# Patient Record
Sex: Male | Born: 1945 | Race: White | Hispanic: No | Marital: Married | Smoking: Former smoker
Health system: Southern US, Community
[De-identification: ages and names within clinical notes are randomized; demographics above are authoritative.]

## PROBLEM LIST (undated history)

## (undated) DIAGNOSIS — D126 Benign neoplasm of colon, unspecified: Secondary | ICD-10-CM

## (undated) DIAGNOSIS — R0609 Other forms of dyspnea: Secondary | ICD-10-CM

## (undated) DIAGNOSIS — M109 Gout, unspecified: Secondary | ICD-10-CM

## (undated) DIAGNOSIS — Z974 Presence of external hearing-aid: Secondary | ICD-10-CM

## (undated) DIAGNOSIS — M47817 Spondylosis without myelopathy or radiculopathy, lumbosacral region: Secondary | ICD-10-CM

## (undated) DIAGNOSIS — N400 Enlarged prostate without lower urinary tract symptoms: Secondary | ICD-10-CM

## (undated) DIAGNOSIS — J45909 Unspecified asthma, uncomplicated: Secondary | ICD-10-CM

## (undated) DIAGNOSIS — T7840XA Allergy, unspecified, initial encounter: Secondary | ICD-10-CM

## (undated) DIAGNOSIS — F32A Depression, unspecified: Secondary | ICD-10-CM

## (undated) DIAGNOSIS — E7800 Pure hypercholesterolemia, unspecified: Secondary | ICD-10-CM

## (undated) DIAGNOSIS — K219 Gastro-esophageal reflux disease without esophagitis: Secondary | ICD-10-CM

## (undated) DIAGNOSIS — N200 Calculus of kidney: Secondary | ICD-10-CM

## (undated) DIAGNOSIS — G4730 Sleep apnea, unspecified: Secondary | ICD-10-CM

## (undated) DIAGNOSIS — I10 Essential (primary) hypertension: Secondary | ICD-10-CM

## (undated) DIAGNOSIS — K449 Diaphragmatic hernia without obstruction or gangrene: Secondary | ICD-10-CM

## (undated) DIAGNOSIS — K5790 Diverticulosis of intestine, part unspecified, without perforation or abscess without bleeding: Secondary | ICD-10-CM

## (undated) DIAGNOSIS — F329 Major depressive disorder, single episode, unspecified: Secondary | ICD-10-CM

## (undated) DIAGNOSIS — J449 Chronic obstructive pulmonary disease, unspecified: Secondary | ICD-10-CM

## (undated) DIAGNOSIS — G621 Alcoholic polyneuropathy: Secondary | ICD-10-CM

## (undated) DIAGNOSIS — G629 Polyneuropathy, unspecified: Secondary | ICD-10-CM

## (undated) DIAGNOSIS — K222 Esophageal obstruction: Secondary | ICD-10-CM

## (undated) HISTORY — DX: Depression, unspecified: F32.A

## (undated) HISTORY — DX: Chronic obstructive pulmonary disease, unspecified: J44.9

## (undated) HISTORY — DX: Benign neoplasm of colon, unspecified: D12.6

## (undated) HISTORY — DX: Diverticulosis of intestine, part unspecified, without perforation or abscess without bleeding: K57.90

## (undated) HISTORY — PX: INGUINAL HERNIA REPAIR: SUR1180

## (undated) HISTORY — PX: COLONOSCOPY: SHX174

## (undated) HISTORY — DX: Pure hypercholesterolemia, unspecified: E78.00

## (undated) HISTORY — DX: Unspecified asthma, uncomplicated: J45.909

## (undated) HISTORY — PX: TONSILLECTOMY AND ADENOIDECTOMY: SUR1326

## (undated) HISTORY — DX: Gout, unspecified: M10.9

## (undated) HISTORY — DX: Gastro-esophageal reflux disease without esophagitis: K22.2

## (undated) HISTORY — PX: POLYPECTOMY: SHX149

## (undated) HISTORY — DX: Essential (primary) hypertension: I10

## (undated) HISTORY — DX: Spondylosis without myelopathy or radiculopathy, lumbosacral region: M47.817

## (undated) HISTORY — DX: Other forms of dyspnea: R06.09

## (undated) HISTORY — DX: Alcoholic polyneuropathy: G62.1

## (undated) HISTORY — DX: Calculus of kidney: N20.0

## (undated) HISTORY — DX: Gastro-esophageal reflux disease without esophagitis: K21.9

## (undated) HISTORY — DX: Allergy, unspecified, initial encounter: T78.40XA

## (undated) HISTORY — DX: Polyneuropathy, unspecified: G62.9

## (undated) HISTORY — DX: Benign prostatic hyperplasia without lower urinary tract symptoms: N40.0

## (undated) HISTORY — DX: Diaphragmatic hernia without obstruction or gangrene: K44.9

## (undated) HISTORY — DX: Major depressive disorder, single episode, unspecified: F32.9

## (undated) HISTORY — DX: Sleep apnea, unspecified: G47.30

## (undated) HISTORY — DX: Presence of external hearing-aid: Z97.4

---

## 1995-06-24 ENCOUNTER — Encounter: Payer: Self-pay | Admitting: Gastroenterology

## 1995-07-06 DIAGNOSIS — D126 Benign neoplasm of colon, unspecified: Secondary | ICD-10-CM

## 1995-07-06 HISTORY — DX: Benign neoplasm of colon, unspecified: D12.6

## 1995-07-13 ENCOUNTER — Encounter: Payer: Self-pay | Admitting: Gastroenterology

## 1997-04-13 ENCOUNTER — Encounter: Payer: Self-pay | Admitting: Gastroenterology

## 1998-04-24 ENCOUNTER — Encounter: Payer: Self-pay | Admitting: Gastroenterology

## 1998-04-24 ENCOUNTER — Ambulatory Visit (HOSPITAL_COMMUNITY): Admission: RE | Admit: 1998-04-24 | Discharge: 1998-04-24 | Payer: Self-pay | Admitting: Gastroenterology

## 1998-05-20 ENCOUNTER — Ambulatory Visit (HOSPITAL_COMMUNITY): Admission: RE | Admit: 1998-05-20 | Discharge: 1998-05-20 | Payer: Self-pay | Admitting: Gastroenterology

## 1998-05-20 ENCOUNTER — Encounter: Payer: Self-pay | Admitting: Gastroenterology

## 2001-05-03 ENCOUNTER — Encounter: Payer: Self-pay | Admitting: Gastroenterology

## 2001-05-03 ENCOUNTER — Encounter (INDEPENDENT_AMBULATORY_CARE_PROVIDER_SITE_OTHER): Payer: Self-pay | Admitting: Specialist

## 2001-05-03 ENCOUNTER — Ambulatory Visit (HOSPITAL_COMMUNITY): Admission: RE | Admit: 2001-05-03 | Discharge: 2001-05-03 | Payer: Self-pay | Admitting: Gastroenterology

## 2001-07-27 ENCOUNTER — Ambulatory Visit (HOSPITAL_BASED_OUTPATIENT_CLINIC_OR_DEPARTMENT_OTHER): Admission: RE | Admit: 2001-07-27 | Discharge: 2001-07-27 | Payer: Self-pay | Admitting: Family Medicine

## 2002-02-23 ENCOUNTER — Emergency Department (HOSPITAL_COMMUNITY): Admission: EM | Admit: 2002-02-23 | Discharge: 2002-02-23 | Payer: Self-pay | Admitting: Emergency Medicine

## 2002-06-30 ENCOUNTER — Encounter: Payer: Self-pay | Admitting: Gastroenterology

## 2003-09-12 ENCOUNTER — Encounter: Admission: RE | Admit: 2003-09-12 | Discharge: 2003-09-12 | Payer: Self-pay | Admitting: Family Medicine

## 2003-12-27 ENCOUNTER — Ambulatory Visit: Payer: Self-pay | Admitting: Gastroenterology

## 2004-02-05 ENCOUNTER — Ambulatory Visit (HOSPITAL_COMMUNITY): Admission: RE | Admit: 2004-02-05 | Discharge: 2004-02-05 | Payer: Self-pay | Admitting: Gastroenterology

## 2004-02-05 ENCOUNTER — Encounter (INDEPENDENT_AMBULATORY_CARE_PROVIDER_SITE_OTHER): Payer: Self-pay | Admitting: *Deleted

## 2004-02-05 ENCOUNTER — Ambulatory Visit: Payer: Self-pay | Admitting: Gastroenterology

## 2004-04-14 ENCOUNTER — Ambulatory Visit: Payer: Self-pay | Admitting: Gastroenterology

## 2004-05-09 ENCOUNTER — Ambulatory Visit: Payer: Self-pay | Admitting: Gastroenterology

## 2004-06-17 ENCOUNTER — Ambulatory Visit: Payer: Self-pay | Admitting: Gastroenterology

## 2004-07-21 ENCOUNTER — Ambulatory Visit: Payer: Self-pay | Admitting: Gastroenterology

## 2004-08-26 ENCOUNTER — Ambulatory Visit: Payer: Self-pay | Admitting: Gastroenterology

## 2005-07-31 ENCOUNTER — Ambulatory Visit: Payer: Self-pay | Admitting: Gastroenterology

## 2005-08-03 ENCOUNTER — Ambulatory Visit: Payer: Self-pay | Admitting: Gastroenterology

## 2006-07-28 ENCOUNTER — Ambulatory Visit: Payer: Self-pay | Admitting: Gastroenterology

## 2007-06-19 ENCOUNTER — Encounter: Admission: RE | Admit: 2007-06-19 | Discharge: 2007-06-19 | Payer: Self-pay | Admitting: Orthopaedic Surgery

## 2007-08-01 ENCOUNTER — Telehealth: Payer: Self-pay | Admitting: Gastroenterology

## 2007-08-05 ENCOUNTER — Encounter (INDEPENDENT_AMBULATORY_CARE_PROVIDER_SITE_OTHER): Payer: Self-pay | Admitting: *Deleted

## 2007-08-19 ENCOUNTER — Encounter: Payer: Self-pay | Admitting: Gastroenterology

## 2007-08-23 ENCOUNTER — Encounter: Payer: Self-pay | Admitting: Gastroenterology

## 2007-09-06 ENCOUNTER — Ambulatory Visit: Payer: Self-pay | Admitting: Gastroenterology

## 2007-09-30 ENCOUNTER — Ambulatory Visit: Payer: Self-pay | Admitting: Gastroenterology

## 2007-09-30 ENCOUNTER — Encounter: Payer: Self-pay | Admitting: Gastroenterology

## 2007-10-03 ENCOUNTER — Encounter: Payer: Self-pay | Admitting: Gastroenterology

## 2008-09-17 ENCOUNTER — Encounter: Payer: Self-pay | Admitting: Gastroenterology

## 2008-12-03 ENCOUNTER — Telehealth: Payer: Self-pay | Admitting: Gastroenterology

## 2008-12-11 ENCOUNTER — Encounter: Payer: Self-pay | Admitting: Gastroenterology

## 2009-01-09 ENCOUNTER — Ambulatory Visit: Payer: Self-pay | Admitting: Gastroenterology

## 2009-01-09 DIAGNOSIS — Z860100 Personal history of colon polyps, unspecified: Secondary | ICD-10-CM | POA: Insufficient documentation

## 2009-01-09 DIAGNOSIS — K219 Gastro-esophageal reflux disease without esophagitis: Secondary | ICD-10-CM | POA: Insufficient documentation

## 2009-09-30 ENCOUNTER — Encounter: Payer: Self-pay | Admitting: Gastroenterology

## 2010-01-07 ENCOUNTER — Encounter (INDEPENDENT_AMBULATORY_CARE_PROVIDER_SITE_OTHER): Payer: Self-pay | Admitting: *Deleted

## 2010-01-21 ENCOUNTER — Telehealth: Payer: Self-pay | Admitting: Gastroenterology

## 2010-02-06 NOTE — Procedures (Signed)
Summary: Colonoscopy   Colonoscopy  Procedure date:  06/30/2002  Findings:      Results: Diverticulosis.       Location:  [facility].    Procedures Next Due Date:    Colonoscopy: 07/2007 Patient Name: Jose, Hines MRN: [id number] Procedure Procedures: Colonoscopy CPT: 54098.  Personnel: Endoscopist: Venita Lick. Russella Dar, MD, [facility].  Exam Location: Exam performed in Outpatient Clinic. Outpatient  Patient Consent: Procedure, Alternatives, Risks and Benefits discussed, consent obtained, from patient. Consent was obtained by the RN.  Indications  Surveillance of: Adenomatous Polyp(s). Initial polypectomy was performed in 1997. in Jul. Pathology of worst  polyp: high-grade dysplasia.  History  Pre-Exam Physical: Performed Jun 30, 2002. Entire physical exam was normal.  Exam Exam: Extent of exam reached: Cecum, extent intended: Cecum.  The cecum was identified by appendiceal orifice and IC valve. Colon retroflexion performed. ASA Classification: II. Tolerance: good.  Monitoring: Pulse and BP monitoring, Oximetry used. Supplemental O2 given.  Colon Prep Used Golytely for colon prep. Prep results: good.  Sedation Meds: Patient assessed and found to be appropriate for moderate (conscious) sedation. Fentanyl 50 mcg. given IV. Versed 5 mg. given IV.  Findings - DIVERTICULOSIS: Sigmoid Colon. Not bleeding. ICD9: Diverticulosis: 562.10.  NORMAL EXAM: Cecum to Descending Colon.  NORMAL EXAM: Rectum.   Assessment  Diagnoses: 562.10: Diverticulosis.   Events  Unplanned Interventions: No intervention was required.  Unplanned Events: There were no complications. Plans Medication Plan: Continue current medications.  Patient Education: Patient given standard instructions for: Diverticulosis.  Disposition: After procedure patient sent to recovery. After recovery patient sent home.  Scheduling/Referral: Colonoscopy, to [facility]. Russella Dar, MD, [facility], around  Jun 30, 2007.  Primary Care Provider, to Marny Lowenstein, MD,   This report was created from the original endoscopy report, which was reviewed and signed by the above listed endoscopist.    cc: Marny Lowenstein, MD

## 2010-02-06 NOTE — Procedures (Signed)
Summary: EGD   EGD  Procedure date:  05/09/2004  Findings:      Findings: Stricture:  Location: [facility]    Procedures Next Due Date:    EGD: 06/2004 Patient Name: Jose Hines, Jose Hines MRN: [id number] Procedure Procedures: Panendoscopy (EGD) CPT: 43235.    with esophageal dilation. CPT: G9296129.  Personnel: Endoscopist: Venita Lick. Russella Dar, MD, [facility] Graham.  Exam Location: Exam performed in Outpatient Clinic. Outpatient  Patient Consent: Procedure, Alternatives, Risks and Benefits discussed, consent obtained, from patient. Consent was obtained by the RN.  Indications  Therapeutics: Reason for exam: Esophageal dilation.  Symptoms: Dysphagia.  History  Current Medications: Patient is not currently taking Coumadin.  Pre-Exam Physical: Performed May 09, 2004  Cardio-pulmonary exam, HEENT exam, Abdominal exam, Neurological exam, Mental status exam WNL.  Exam Exam Info: Maximum depth of insertion Duodenum, intended Duodenum. Vocal cords not visualized. Gastric retroflexion performed. ASA Classification: II. Tolerance: excellent.  Sedation Meds: Patient assessed and found to be appropriate for moderate (conscious) sedation. Fentanyl 50 mcg. given IV. Versed 7 mg. given IV. Cetacaine Spray 2 sprays given aerosolized.  Monitoring: BP and pulse monitoring done. Oximetry used. Supplemental O2 given  Findings Normal: Proximal Esophagus to Mid Esophagus.  STRICTURE / STENOSIS: Stricture in Distal Esophagus.  39 cm from mouth. Lumen diameter is 11 mm. ICD9: Esophageal Stricture: 530.3.  - Dilation: Mid Esophagus. Savary dilator used, Diameter: 12 mm, Minimal Resistance, No Heme present on extraction. Savary dilator used, Diameter: 13 mm, Minimal Resistance, No Heme present on extraction. Savary dilator used, Diameter: 14 mm, Minimal Resistance, No Heme present on extraction. Patient tolerance excellent. Outcome: successful.  HIATAL HERNIA: Regular, 3 cms. in length. ICD9:  Hernia, Hiatal: 553.3. Normal: Fundus to Duodenal 2nd Portion.   Assessment  Diagnoses: 553.3: Hernia, Hiatal.  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   Events  Unplanned Intervention: No unplanned interventions were required.  Unplanned Events: There were no complications. Plans Instructions: Nothing to eat or drink for 1 hour.  Clear or full liquids: 1 hour.  Medication(s): Continue current medications. PPI: QAM, for indefinitely.   Patient Education: Patient given standard instructions for: Hiatal Hernia. Reflux. Stenosis / Stricture.  Disposition: After procedure patient sent to recovery. After recovery patient sent home.  Scheduling: EGD, to [employer]. Russella Dar, MD, [facility], with Savary dilation around Jun 09, 2004.    This report was created from the original endoscopy report, which was reviewed and signed by the above listed endoscopist.

## 2010-02-06 NOTE — Procedures (Signed)
Summary: EGD and biopsy   EGD  Procedure date:  02/05/2004  Findings:      Findings: Stricture:  Location: [facility]   Patient Name: Jose Hines, Jose Hines MRN: [id number] Procedure Procedures: Panendoscopy (EGD) CPT: 43235.    with biopsy(s)/brushing(s). CPT: D1846139.    with balloon dilation. CPT: T9508883.  Personnel: Endoscopist: Venita Lick. Russella Dar, MD, Clement[facility] Graham.  Exam Location: Exam performed in Endoscopy Suite. Outpatient  Patient Consent: Procedure, Alternatives, Risks and Benefits discussed, consent obtained, from patient.  Indications  Therapeutics: Reason for exam: Esophageal dilation.  Symptoms: Dysphagia.  History  Current Medications: Patient is not currently taking Coumadin.  Pre-Exam Physical: Performed Feb 05, 2004  Entire physical exam was normal.  Exam Exam Info: Maximum depth of insertion Duodenum, intended Duodenum. Vocal cords not visualized. Gastric retroflexion performed. ASA Classification: II. Tolerance: excellent.  Sedation Meds: Patient assessed and found to be appropriate for moderate (conscious) sedation. Cetacaine Spray 2 sprays given aerosolized. Versed 7 mg. given IV. Fentanyl 75 mcg. given IV.  Monitoring: BP and pulse monitoring done. Oximetry used. Supplemental O2 given  Findings Normal: Proximal Esophagus to Mid Esophagus.  STRICTURE / STENOSIS: Stricture in Distal Esophagus.  Constriction: partial. 38 cm from mouth. Lumen diameter is 11 mm. Biopsy of Stricture/Steno  taken. ICD9: Esophageal Stricture: 530.3. Comment: thick, fibrotic benign appearing stricture.  - Dilation: Distal Esophagus. TTS dilator used, Diameter: 12-13.5-15 mm, Minimal Resistance, No Heme present on extraction. Patient tolerance excellent. Outcome: successful. Comments: CRE balloon dilation.  HIATAL HERNIA: Regular, 3 cms. in length. ICD9: Hernia, Hiatal: 553.3. Normal: Fundus to Duodenal 2nd Portion.   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    553.3: Hernia, Hiatal.   Events  Unplanned Intervention: No unplanned interventions were required.  Unplanned Events: There were no complications. Plans Instructions: Nothing to eat or drink for 1 hour.  Clear or full liquids: 2 hours.  Medication(s): Await pathology. Continue current medications. PPI: QAM, for indefinitely.   Patient Education: Patient given standard instructions for: Hiatal Hernia. Reflux. Stenosis / Stricture.  Disposition: After procedure patient sent to recovery. After recovery patient sent home.  Scheduling: Office Visit, to [employer]. Russella Dar, MD, [facility], assess need for repeat dilation around Mar 04, 2004.    This report was created from the original endoscopy report, which was reviewed and signed by the above listed endoscopist.          SP Surgical Pathology - STATUS: Final             By: Osie Bond  ,      Perform Date: 31Jan06 00:00  Ordered By: Rica Records Date: 31Jan06 13:51  Facility: [facility]                              Department: CPATH  Service Report Text  [facility]   [address] [place], [place] [address]   [phone]    REPORT OF SURGICAL PATHOLOGY    Case #: [id number]   Patient Name: Jose Hines, Jose Hines.   PID: [id number]   Pathologist: Renato Battles, M.D.   DOB/Age 27-Apr-1945 (Age: 65) Gender: M   Date Taken: 02/05/2004   Date Received: 02/05/2004    FINAL DIAGNOSIS    ***MICROSCOPIC EXAMINATION AND DIAGNOSIS***    ESOPHAGUS, BIOPSY: SUPERFICIAL FRAGMENTS OF SQUAMOUS MUCOSA WITH   REACTIVE EPITHELIAL  CHANGES, CONSISTENT WITH GASTROESOPHAGEAL   REFLUX. NO DYSPLASIA OR MALIGNANCY IDENTIFIED.    COMMENT   An Alcian Blue stain is performed to determine the presence of   intestinal metaplasia (goblet cell metaplasia). No intestinal   metaplasia (goblet cell metaplasia) is identified with the Alcian   Blue stain. The control stained appropriately.    kv   Date  Reported: 02/06/2004 Renato Battles, M.D.   *** Electronically Signed Out By MS ***    Clinical information   (hd)    specimen(s) obtained   Esophagus, biopsy, strictue    Gross Description   Received in formalin are tan, soft tissue fragments that are   submitted in toto. Number: multiple   Size: 0.5 x 0.3 x 0.1 cm/1 block (SW:jy) 02/05/04    jy/

## 2010-02-06 NOTE — Procedures (Signed)
Summary: EGD   EGD  Procedure date:  08/03/2005  Findings:      Findings: Stricture:  Location: [facility]   Patient Name: Jose Hines, Jose Hines MRN: [id number] Procedure Procedures: Panendoscopy (EGD) CPT: 43235.    with esophageal dilation. CPT: G9296129.  Personnel: Endoscopist: Venita Lick. Russella Dar, MD, [facility].  Exam Location: Exam performed in Outpatient Clinic. Outpatient  Patient Consent: Procedure, Alternatives, Risks and Benefits discussed, consent obtained, from patient. Consent was obtained by the RN.  Indications  Therapeutics: Reason for exam: Esophageal dilation.  Symptoms: Dysphagia.  History  Current Medications: Patient is not currently taking Coumadin.  Pre-Exam Physical: Performed Aug 03, 2005  Cardio-pulmonary exam, HEENT exam, Abdominal exam, Mental status exam WNL.  Comments: Pt. history reviewed/updated, physical exam performed prior to initiation of sedation?Yes Exam Exam Info: Maximum depth of insertion Duodenum, intended Duodenum. Vocal cords not visualized. Gastric retroflexion performed. ASA Classification: II. Tolerance: excellent.  Sedation Meds: Patient assessed and found to be appropriate for moderate (conscious) sedation. Fentanyl 75 mcg. given IV. Versed 8 mg. given IV. Cetacaine Spray 2 sprays given aerosolized.  Monitoring: BP and pulse monitoring done. Oximetry used. Supplemental O2 given  Findings Normal: Proximal Esophagus to Mid Esophagus.  STRICTURE / STENOSIS: Stricture in Distal Esophagus.  Constriction: partial. Lumen diameter is 14 mm. ICD9: Esophageal Stricture: 530.3.  - Dilation: Distal Esophagus. Savary dilator used, Diameter: 15 mm, Minimal Resistance, No Heme present on extraction. Savary dilator used, Diameter: 16 mm, Minimal Resistance, No Heme present on extraction. Savary dilator used, Diameter: 17 mm, Minimal Resistance, No Heme present on extraction. Patient tolerance excellent. Outcome: successful.    HIATAL HERNIA: Regular, 3 cms. in length. ICD9: Hernia, Hiatal: 553.3. Normal: Fundus to Duodenal 2nd Portion.   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  553.3: Hernia, Hiatal.   Events  Unplanned Intervention: No unplanned interventions were required.  Unplanned Events: There were no complications. Plans Instructions: Nothing to eat or drink for 1 hour.  Clear or full liquids: 1hour. No aspirin or non-steroidal containing medications: 1 week.  Medication(s): PPI: Omeprazole/Prilosec 40 mg QAM, for indefinitely.   Patient Education: Patient given standard instructions for: Hiatal Hernia. Reflux. Stenosis / Stricture.  Disposition: After procedure patient sent to recovery. After recovery patient sent home.  Scheduling: Office Visit, to [employer]. Russella Dar, MD, [facility], around Aug 04, 2006.    This report was created from the original endoscopy report, which was reviewed and signed by the above listed endoscopist.

## 2010-02-06 NOTE — Procedures (Signed)
Summary: [employer]   Imported By: Sherian Rein 01/10/2009 14:42:12  _____________________________________________________________________  External Attachment:    Type:   Image     Comment:   External Document

## 2010-02-06 NOTE — Assessment & Plan Note (Signed)
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 He last underwent upper endoscopy in 2009 with Savary dilation. He reports no ongoing dysphagia or reflux symptoms. Colonoscopy was also performed in 2009.   GI Review of Systems      Denies abdominal pain, acid reflux, belching, bloating, chest pain, dysphagia with liquids, dysphagia with solids, heartburn, loss of appetite, nausea, vomiting, vomiting blood, weight loss, and  weight gain.        Denies anal fissure, black tarry stools, change in bowel habit, constipation, diarrhea, diverticulosis, fecal incontinence, heme positive stool, hemorrhoids, irritable bowel syndrome, jaundice, light color stool, liver problems, rectal bleeding, and  rectal pain.   Current Medications (verified): 1)  Nexium 40 Mg  Cpdr (Esomeprazole Magnesium) .[facility].. 1 Capsule Each Day 30 Minutes Before Meal....must Have Office Visit 2)  Lovastatin 40 Mg Tabs (Lovastatin) .... Once Daily 3)  Lisinopril-Hydrochlorothiazide 10-12.5 Mg Tabs (Lisinopril-Hydrochlorothiazide) .... Once Daily 4)  Allopurinol 300 Mg Tabs (Allopurinol) .... Once Daily 5)  Aspir-Low 81 Mg Tbec (Aspirin) .... Once Daily 6)  Nabumetone 500 Mg Tabs (Nabumetone) .... 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 right inguinal hernia repair T & A  Family History: Reviewed history from 01/08/2009 and no changes required. Family  History of Heart Disease: Father  Social History: Patient is a former smoker.  Alcohol Use - yes Illicit Drug Use - no Occupation: Engineer, site Daily Caffeine Use  Review of Systems       The patient complains of hearing problems.         The pertinent positives and negatives are noted as above and in the HPI. All other ROS were reviewed and were negative.   Vital Signs:  Patient profile:   65 year old male Height:      72 inches Weight:      207.50 pounds BMI:     28.24 Pulse rate:   64 / minute Pulse rhythm:   regular BP sitting:   104 / 66  (left arm) Cuff size:   regular  Vitals Entered By: June McMurray CMA Duncan Dull) (January 09, 2009 3:54 PM)  Physical Exam  General:  Well developed, well nourished, no acute distress. Head:  Normocephalic and atraumatic. Eyes:  PERRLA, no icterus. Mouth:  No deformity or lesions, dentition normal. Lungs:  Clear throughout to auscultation. Heart:  Regular rate and rhythm; no murmurs, rubs,  or bruits. Abdomen:  Soft, nontender and nondistended. No masses, hepatosplenomegaly or hernias noted. Normal bowel sounds. Psych:  Alert and cooperative. Normal mood and affect.  Impression & Recommendations:  Problem # 1:  GERD (ICD-530.81) GERD with a history of recurrent peptic strictures. Continue standard antireflux measures and Nexium 40 mg q.a.m. If his dysphagia returns he is advised to call for further evaluation.  Problem # 2:  PERSONAL HX COLONIC POLYPS (ICD-V12.72) Adenomatous colon polyps, initially diagnosed in 1997. Surveillance colonoscopy recommended September 2014.  Patient Instructions: 1)  Pick up your prescription at your pharmacy.  2)  Please schedule  a follow-up appointment in 1 year. 3)  Copy sent to : Marny Lowenstein, MD 4)  The medication list was reviewed and reconciled.  All changed / newly prescribed medications were explained.  A complete medication list was provided to the patient /  caregiver.  Prescriptions: NEXIUM 40 MG  CPDR (ESOMEPRAZOLE MAGNESIUM) 1 capsule each day 30 minutes before meal...  #30 x 11   Entered by:   Christie Nottingham CMA (AAMA)   Authorized by:   Meryl Dare MD [facility]   Signed by:   Christie Nottingham CMA (AAMA) on 01/09/2009   Method used:   Electronically to        [facility] (retail)       [address]       [place], [place]  [address]       Ph: [phone]       Fax: [phone]   RxID:   [id number]

## 2010-02-06 NOTE — Procedures (Signed)
Summary: [facility]  EGD/[facility]   Imported By: Sherian Rein 01/10/2009 14:49:47  _____________________________________________________________________  External Attachment:    Type:   Image     Comment:   External Document

## 2010-02-06 NOTE — Medication Information (Signed)
Summary: Approved Nexium / [employer]  Approved Nexium / [employer]   Imported By: Lennie Odor 10/01/2009 15:35:07  _____________________________________________________________________  External Attachment:    Type:   Image     Comment:   External Document

## 2010-02-06 NOTE — Procedures (Signed)
Summary: EGD and biopsy   EGD  Procedure date:  05/03/2001  Findings:      Findings: Stricture:  Location: [facility]    EGD  Procedure date:  05/03/2001  Findings:      Findings: Stricture:  Location: [facility]   Patient Name: Jose Hines, Jose Hines MRN: [id number] Procedure Procedures: Panendoscopy (EGD) CPT: 43235.    with biopsy(s)/brushing(s). CPT: D1846139.    with esophageal dilation. CPT: G9296129.  Personnel: Endoscopist: Venita Lick. Russella Dar, MD, [facility].  Exam Location: Exam performed in Radiology. Outpatient  Patient Consent: Procedure, Alternatives, Risks and Benefits discussed, consent obtained, from patient.  Indications  Therapeutics: Reason for exam: Esophageal dilation.  Symptoms: Dysphagia.  History  Pre-Exam Physical: Performed May 03, 2001  Cardio-pulmonary exam, HEENT exam, Abdominal exam, Neurological exam, Mental status exam WNL.  Exam Exam Info: Maximum depth of insertion Duodenum, intended Duodenum. Vocal cords not visualized. Gastric retroflexion performed. ASA Classification: II. Tolerance: good.  Sedation Meds: Patient assessed and found to be appropriate for moderate (conscious) sedation. Fentanyl 75 mcg. given IV. Versed 6 mg. given IV. Cetacaine Spray 2 sprays given aerosolized.  Monitoring: BP and pulse monitoring done. Oximetry used. Supplemental O2 given  Fluoroscopy: Fluoroscopy was used.  Findings Normal: Proximal Esophagus to Mid Esophagus.  STRICTURE / STENOSIS: Stricture in Distal Esophagus.  Constriction: partial. Etiology: benign due to reflux. 40 cm from mouth. Biopsy of Stricture/Steno  taken. ICD9: Esophageal Stricture: 530.3.  - Dilation: Distal Esophagus. Procedure was performed under Fluoroscopy. Savary dilator used, Diameter: 12 mm, Minimal Resistance, No Heme present on extraction. Savary dilator used, Diameter: 12.8 mm, Minimal Resistance, No Heme present on extraction. Savary dilator used, Diameter:  14 mm, Minimal Resistance, Minimal Heme present on extraction. Patient tolerance fair, adequate exam. Outcome: successful.  HIATAL HERNIA: Regular, 3 cms. in length. ICD9: Hernia, Hiatal: 553.3. Normal: Fundus to Duodenal 2nd Portion.   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  553.3: Hernia, Hiatal.   Events  Unplanned Intervention: No unplanned interventions were required.  Unplanned Events: There were no complications. Plans Instructions: Clear or full liquids: 2 hours. Resume previous diet: 3 hours.  Medication(s): Await pathology. Continue current medications. PPI: Esomeprazole/Nexium 40 mg QAM, for indefinitely.   Patient Education: Patient given standard instructions for: Hiatal Hernia. Reflux. Stenosis / Stricture.  Disposition: After procedure patient sent to recovery. After recovery patient sent home.  Scheduling: Office Visit, to [employer]. Russella Dar, MD, [facility], around Jun 09, 2001.    This report was created from the original endoscopy report, which was reviewed and signed by the above listed endoscopist.    SP Surgical Pathology - STATUS: Final             By: Threasa Beards  ,        Perform Date: 29Apr03 09:29  Ordered By: Rica Records Date:  Facility: [facility]                              Department: CPATH  Service Report Text  [facility]   [address] [place], [place] [address]   [phone]    REPORT OF SURGICAL PATHOLOGY    Case #: [id number]   Patient Name: Jose Hines, Jose Hines.   PID: [id number]   Pathologist: Havery Moros, MD   DOB/Age 07-15-1945 (Age: 64) Gender: M  Date Taken: 05/03/2001   Date Received: 05/03/2001    FINAL DIAGNOSIS    ***MICROSCOPIC EXAMINATION AND DIAGNOSIS***    ESOPHAGUS, BIOPSY: BENIGN SQUAMOUS MUCOSA. NO INTESTINAL   METAPLASIA OR DYSPLASIA IDENTIFIED.    COMMENT   An Alcian Blue stain is performed to determine the presence of   intestinal metaplasia.  No intestinal metaplasia is identified   with the Alcian Blue stain. The control stained appropriately.   (BNS:jy)05/04/01    jy   Date Reported: 05/04/2001 Havery Moros, MD   *** Electronically Signed Out By BNS ***    Clinical information   R/O peptic stricture (ac)    specimen(s) obtained   Esophageal stricture    Gross Description   Received in formalin is a tan, soft tissue fragment that is   submitted in toto. Size: 0.4 x 0.3 x 0.2 cm. An Alcian blue   stain is requested. (JJ:caf 05/03/01)    cf/

## 2010-02-06 NOTE — Procedures (Signed)
Summary: EGD/[facility]  EGD/[facility]   Imported By: Sherian Rein 01/10/2009 14:44:05  _____________________________________________________________________  External Attachment:    Type:   Image     Comment:   External Document

## 2010-02-06 NOTE — Procedures (Signed)
Summary: EGD/[facility]  EGD/[facility]   Imported By: Sherian Rein 01/10/2009 14:40:11  _____________________________________________________________________  External Attachment:    Type:   Image     Comment:   External Document

## 2010-02-06 NOTE — Procedures (Signed)
Summary: [facility]  EGD[facility]   Imported By: Sherian Rein 01/10/2009 14:51:01  _____________________________________________________________________  External Attachment:    Type:   Image     Comment:   External Document

## 2010-02-06 NOTE — Procedures (Signed)
Summary: EGD   EGD  Procedure date:  07/28/2006  Findings:      Findings: Stricture:  Location: [facility]    EGD  Procedure date:  07/28/2006  Findings:      Findings: Stricture:  Location: [facility]   Patient Name: Bowdy, Bair MRN: [id number] Procedure Procedures: Panendoscopy (EGD) CPT: 43235.    with esophageal dilation. CPT: G9296129.  Personnel: Endoscopist: Venita Lick. Russella Dar, MD, [facility].  Exam Location: Exam performed in Outpatient Clinic. Outpatient  Patient Consent: Procedure, Alternatives, Risks and Benefits discussed, consent obtained, from patient. Consent was obtained by the RN.  Indications  Therapeutics: Reason for exam: Esophageal dilation.  Symptoms: Dysphagia.  History  Current Medications: Patient is not currently taking Coumadin.  Pre-Exam Physical: Performed Jul 28, 2006  Cardio-pulmonary exam, HEENT exam, Abdominal exam, Mental status exam WNL.  Comments: Pt. history reviewed/updated, physical exam performed prior to initiation of sedation?Yes Exam Exam Info: Maximum depth of insertion Duodenum, intended Duodenum. Vocal cords not visualized. Gastric retroflexion performed. ASA Classification: II. Tolerance: excellent.  Sedation Meds: Patient assessed and found to be appropriate for moderate (conscious) sedation. Fentanyl 50 mcg. given IV. Versed 8 mg. given IV. Cetacaine Spray 2 sprays given aerosolized.  Monitoring: BP and pulse monitoring done. Oximetry used. Supplemental O2 given  Findings Normal: Proximal Esophagus to Mid Esophagus.  STRICTURE / STENOSIS: Stricture in Distal Esophagus.  Lumen diameter is 15 mm. ICD9: Esophageal Stricture: 530.3.  - Dilation: Distal Esophagus. Savary dilator used, Diameter: 16 mm, Minimal Resistance, Moderate Heme present on extraction. Savary dilator used, Diameter: 17 mm, Minimal Resistance, Minimal Heme present on extraction. Patient tolerance excellent.  Outcome: successful.  HIATAL HERNIA: 3 cms. in length. ICD9: Hernia, Hiatal: 553.3. Normal: Body to Duodenal 2nd Portion.   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  553.3: Hernia, Hiatal.   Events  Unplanned Intervention: No unplanned interventions were required.  Unplanned Events: There were no complications. Plans Instructions: Nothing to eat or drink for 1 hour.  Clear or full liquids: 1 hour.  Medication(s): PPI: Esomeprazole/Nexium 40 mg QAM, for indefinitely.   Patient Education: Patient given standard instructions for: Hiatal Hernia. Reflux. Stenosis / Stricture.  Disposition: After procedure patient sent to recovery. After recovery patient sent home.  Scheduling: Office Visit, to [employer]. Russella Dar, MD, [facility], around Jul 28, 2007.    This report was created from the original endoscopy report, which was reviewed and signed by the above listed endoscopist.

## 2010-02-06 NOTE — Progress Notes (Signed)
Summary: Medication refill  Medications Added NEXIUM 40 MG  CPDR (ESOMEPRAZOLE MAGNESIUM) 1 capsule each day 30 minutes before meal Keep appt!       Phone Note Call from Patient Call back at Home Phone [phone]   Caller: Patient Call For: Dr. Russella Dar Reason for Call: Refill Medication Summary of Call: Needs his Nex[facility] [place] Kitchen[facility] Appointment: 02/25/2010, Initial call taken by: Karna Christmas,  January 21, 2010 1:11 PM  Follow-up for Phone Call        Rx was sent to pts pharmacy but pt notified to keep appt for any further refills. Follow-up by: Christie Nottingham CMA (AAMA),  January 21, 2010 1:25 PM    New/Updated Medications: NEXIUM 40 MG  CPDR (ESOMEPRAZOLE MAGNESIUM) 1 capsule each day 30 minutes before meal Keep appt! Prescriptions: NEXIUM 40 MG  CPDR (ESOMEPRAZOLE MAGNESIUM) 1 capsule each day 30 minutes before meal Keep appt!  #30 x 0   Entered by:   Christie Nottingham CMA (AAMA)   Authorized by:   Meryl Dare MD [facility]   Signed by:   Christie Nottingham CMA (AAMA) on 01/21/2010   Method used:   Electronically to        [facility] (retail[facility]       [address]       [place], [place]  [address]       Ph: [phone]       Fax: [phone]   RxID:   [id number]

## 2010-02-06 NOTE — Procedures (Signed)
Summary: EGD   EGD  Procedure date:  07/21/2004  Findings:      Findings: Stricture:  Location: [facility]    Procedures Next Due Date:    EGD: 09/2004 Patient Name: Jose Hines, Jose Hines MRN: [id number] Procedure Procedures: Panendoscopy (EGD) CPT: 43235.    with esophageal dilation. CPT: G9296129.  Personnel: Endoscopist: Venita Lick. Russella Dar, MD, [facility] Graham.  Exam Location: Exam performed in Outpatient Clinic. Outpatient  Patient Consent: Procedure, Alternatives, Risks and Benefits discussed, consent obtained, from patient. Consent was obtained by the RN.  Indications  Therapeutics: Reason for exam: Esophageal dilation.  Symptoms: Dysphagia.  History  Current Medications: Patient is not currently taking Coumadin.  Pre-Exam Physical: Performed Jul 21, 2004  Cardio-pulmonary exam, HEENT exam, Abdominal exam, Mental status exam WNL.  Exam Exam Info: Maximum depth of insertion Duodenum, intended Duodenum. Vocal cords not visualized. Gastric retroflexion performed. ASA Classification: II. Tolerance: excellent.  Sedation Meds: Patient assessed and found to be appropriate for moderate (conscious) sedation. Fentanyl 75 mcg. given IV. Versed 6 mg. given IV. Cetacaine Spray 2 sprays given aerosolized.  Monitoring: BP and pulse monitoring done. Oximetry used. Supplemental O2 given  Findings Normal: Proximal Esophagus to Mid Esophagus.  STRICTURE / STENOSIS: Stricture in Distal Esophagus.  Constriction: partial. 39 cm from mouth. Lumen diameter is 13 mm. ICD9: Esophageal Stricture: 530.3.  - Dilation: Distal Esophagus. Savary dilator used, Diameter: 13 mm, No Resistance, No Heme present on extraction. Savary dilator used, Diameter: 14 mm, Minimal Resistance, No Heme present on extraction. Savary dilator used, Diameter: 15 mm, Minimal Resistance, No Heme present on extraction. Savary dilator used, Diameter: 16 mm, Minimal Resistance, Significant Heme present on extraction.  Patient tolerance excellent. Outcome: successful.  HIATAL HERNIA: 3 cms. in length. ICD9: Hernia, Hiatal: 553.3. Normal: Fundus to Duodenal 2nd Portion.   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  553.3: Hernia, Hiatal.   Events  Unplanned Intervention: No unplanned interventions were required.  Unplanned Events: There were no complications. Plans Instructions: Nothing to eat or drink for 1 hour.  Clear or full liquids: 2 hours. No aspirin or non-steroidal containing medications: 1 week.  Medication(s): PPI: QAM, for indefinitely.   Patient Education: Patient given standard instructions for: Hiatal Hernia. Reflux. Stenosis / Stricture.  Disposition: After procedure patient sent to recovery. After recovery patient sent home.  Scheduling: EGD, to [employer]. Russella Dar, MD, [facility] with savary dilation around Aug 21, 2004.    This report was created from the original endoscopy report, which was reviewed and signed by the above listed endoscopist.

## 2010-02-06 NOTE — Procedures (Signed)
Summary: EGD   EGD  Procedure date:  06/17/2004  Findings:      Findings: Stricture:  Location: [facility]    Procedures Next Due Date:    EGD: 07/2004 Patient Name: Jose Hines, Jose Hines MRN: [id number] Procedure Procedures: Panendoscopy (EGD) CPT: 43235.    with esophageal dilation. CPT: G9296129.  Personnel: Endoscopist: Venita Lick. Russella Dar, MD, [facility].  Exam Location: Exam performed in Outpatient Clinic. Outpatient  Patient Consent: Procedure, Alternatives, Risks and Benefits discussed, consent obtained, from patient. Consent was obtained by the RN.  Indications  Therapeutics: Reason for exam: Esophageal dilation.  Symptoms: Dysphagia.  History  Current Medications: Patient is not currently taking Coumadin.  Pre-Exam Physical: Performed Jun 17, 2004  Cardio-pulmonary exam, HEENT exam, Abdominal exam, Mental status exam WNL.  Exam Exam Info: Maximum depth of insertion Duodenum, intended Duodenum. Vocal cords not visualized. Gastric retroflexion performed. ASA Classification: II. Tolerance: excellent.  Sedation Meds: Patient assessed and found to be appropriate for moderate (conscious) sedation. Fentanyl 50 mcg. given IV. Versed 9 mg. given IV. Cetacaine Spray 2 sprays given aerosolized.  Monitoring: BP and pulse monitoring done. Oximetry used. Supplemental O2 given  Findings Normal: Proximal Esophagus to Mid Esophagus.  STRICTURE / STENOSIS: Stricture in Distal Esophagus.  Constriction: partial. 39 cm from mouth. ICD9: Esophageal Stricture: 530.3.  - Dilation: Distal Esophagus. Savary dilator used, Diameter: 12 mm, No Resistance, No Heme present on extraction. Savary dilator used, Diameter: 13 mm, Minimal Resistance, No Heme present on extraction. Savary dilator used, Diameter: 14 mm, Minimal Resistance, No Heme present on extraction. Savary dilator used, Diameter: 15 mm, Minimal Resistance, No Heme present on extraction. Patient tolerance excellent.  Outcome: successful.  HIATAL HERNIA: Regular, 3 cms. in length. ICD9: Hernia, Hiatal: 553.3. Normal: Fundus to Duodenal 2nd Portion.   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  553.3: Hernia, Hiatal.   Events  Unplanned Intervention: No unplanned interventions were required.  Unplanned Events: There were no complications. Plans Instructions: Nothing to eat or drink for 1 hour.  Clear or full liquids: 1 hour. No aspirin or non-steroidal containing medications: 1 week.  Medication(s): PPI: QAM, for indefinitely.   Patient Education: Patient given standard instructions for: Hiatal Hernia. Reflux. Stenosis / Stricture.  Disposition: After procedure patient sent to recovery. After recovery patient sent home.  Scheduling: EGD, to [employer]. Russella Dar, MD, [facility], around Jul 17, 2004.    This report was created from the original endoscopy report, which was reviewed and signed by the above listed endoscopist.

## 2010-02-06 NOTE — Letter (Signed)
Summary: Office Visit Letter  [facility]  [address] [place], [place] [address]   Phone: [phone]  Fax: [phone]      January 07, 2010 MRN: [id number]   BRADY SCHILLER [address] [place], [place]  [address]   Dear Mr. Marcou,   According to our records, it is time for you to schedule a follow-up office visit with [place].   At your convenience, please call [phone] (option #2)to schedule an office visit. If you have any questions, concerns, or feel that this letter is in error, we would appreciate your call.   Sincerely,  Judie Petit T. Russella Dar, M.D.  [facility] [phone]

## 2010-02-06 NOTE — Procedures (Signed)
Summary: EGD   EGD  Procedure date:  08/26/2004  Findings:      Findings: Stricture:  Location: [facility]    EGD  Procedure date:  08/26/2004  Findings:      Findings: Stricture:  Location: [facility]   Patient Name: Kipp, Shank MRN: [id number] Procedure Procedures: Panendoscopy (EGD) CPT: 43235.    with esophageal dilation. CPT: G9296129.  Personnel: Endoscopist: Venita Lick. Russella Dar, MD, [facility].  Exam Location: Exam performed in Outpatient Clinic. Outpatient  Patient Consent: Procedure, Alternatives, Risks and Benefits discussed, consent obtained, from patient. Consent was obtained by the RN.  Indications  Therapeutics: Reason for exam: Esophageal dilation.  Symptoms: Dysphagia.  History  Current Medications: Patient is not currently taking Coumadin.  Pre-Exam Physical: Performed Aug 26, 2004  Cardio-pulmonary exam, HEENT exam, Abdominal exam, Mental status exam WNL.  Exam Exam Info: Maximum depth of insertion Duodenum, intended Duodenum. Vocal cords not visualized. Gastric retroflexion performed. ASA Classification: II. Tolerance: excellent.  Sedation Meds: Patient assessed and found to be appropriate for moderate (conscious) sedation. Fentanyl 50 mcg. given IV. Versed 8 mg. given IV. Cetacaine Spray 2 sprays given aerosolized.  Monitoring: BP and pulse monitoring done. Oximetry used. Supplemental O2 given  Findings Normal: Proximal Esophagus to Mid Esophagus.  STRICTURE / STENOSIS: Stricture in Distal Esophagus.  Etiology: benign due to reflux. 39 cm from mouth. Lumen diameter is 15 mm. ICD9: Esophageal Stricture: 530.3.  - Dilation: Body. Savary dilator used, Diameter: 16 mm, Minimal Resistance, No Heme present on extraction. Savary dilator used, Diameter: 17 mm, Minimal Resistance, No Heme present on extraction. Savary dilator used, Diameter: 18 mm, Moderate Resistance, No Heme present on extraction. Patient tolerance good.  Outcome: successful.  HIATAL HERNIA: Regular, 3 cms. in length. ICD9: Hernia, Hiatal: 553.3. Normal: Fundus to Duodenal 2nd Portion.   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  553.3: Hernia, Hiatal.   Events  Unplanned Intervention: No unplanned interventions were required.  Unplanned Events: There were no complications. Plans Instructions: Nothing to eat or drink for 1 hour.  Clear or full liquids: 2 hours.  Medication(s): PPI: QAM, for indefinitely.   Patient Education: Patient given standard instructions for: Hiatal Hernia. Reflux. Stenosis / Stricture.  Disposition: After procedure patient sent to recovery. After recovery patient sent home.  Scheduling: Office Visit, to [employer]. Russella Dar, MD, [facility], around Feb 26, 2005.    This report was created from the original endoscopy report, which was reviewed and signed by the above listed endoscopist.

## 2010-02-25 ENCOUNTER — Ambulatory Visit (INDEPENDENT_AMBULATORY_CARE_PROVIDER_SITE_OTHER): Payer: BC Managed Care – PPO | Admitting: Gastroenterology

## 2010-02-25 ENCOUNTER — Encounter: Payer: Self-pay | Admitting: Gastroenterology

## 2010-02-25 DIAGNOSIS — Z86010 Personal history of colonic polyps: Secondary | ICD-10-CM

## 2010-02-25 DIAGNOSIS — K219 Gastro-esophageal reflux disease without esophagitis: Secondary | ICD-10-CM

## 2010-03-04 NOTE — Assessment & Plan Note (Signed)
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 His reflux symptoms are under excellent control and he relates no solid food dysphagia. He had a piecemeal polypectomy of an adenomatous polyp from his hepatic flexure in 2009 and he was recommended to undergo a 2 years followup colonoscopy which has not yet been completed.   GI Review of Systems      Denies abdominal pain, acid reflux, belching, bloating, chest pain, dysphagia with liquids, dysphagia with solids, heartburn, loss of appetite, nausea, vomiting, vomiting blood, weight loss, and  weight gain.        Denies anal fissure, black tarry stools, change in bowel habit, constipation, diarrhea, diverticulosis, fecal incontinence, heme positive stool, hemorrhoids, irritable bowel syndrome, jaundice, light color stool, liver problems, rectal bleeding, and  rectal pain.   Current Medications (verified): 1)  Nexium 40 Mg  Cpdr (Esomeprazole Magnesium) .[facility] Kitchen.. 1 Capsule Each Day 30 Minutes Before Meal Keep Appt! 2)  Lovastatin 40 Mg Tabs (Lovastatin) .... Once Daily 3)  Lisinopril-Hydrochlorothiazide 10-12.5 Mg Tabs (Lisinopril-Hydrochlorothiazide) .... Once Daily 4)  Allopurinol 300 Mg Tabs (Allopurinol) .... Once Daily 5)  Aspir-Low 81 Mg Tbec (Aspirin) .... Once Daily 6)  Nabumetone 500 Mg Tabs (Nabumetone) .... 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 right  inguinal hernia repair T & A  Family History: Reviewed history from 01/08/2009 and no changes required. Family History of Heart Disease: Father  Social History: Reviewed history from 01/09/2009 and no changes required. Patient is a former smoker.  Alcohol Use - yes Illicit Drug Use - no Occupation: Engineer, site Daily Caffeine Use  Vital Signs:  Patient profile:   65 year old male Height:      72 inches Weight:      210.38 pounds BMI:     28.64 Pulse rate:   72 / minute Pulse rhythm:   regular BP sitting:   120 / 68  (left arm) Cuff size:   regular  Vitals Entered By: June McMurray CMA Duncan Dull) (February 25, 2010 4:11 PM)  Physical Exam  General:  Well developed, well nourished, no acute distress. Head:  Normocephalic and atraumatic. Eyes:  PERRLA, no icterus. Mouth:  No deformity or lesions, dentition normal. Lungs:  Clear throughout to auscultation. Heart:  Regular rate and rhythm; no murmurs, rubs,  or bruits. Abdomen:  Soft, nontender and nondistended. No masses, hepatosplenomegaly or hernias noted. Normal bowel sounds. Psych:  Alert and cooperative. Normal mood and affect.  Impression & Recommendations:  Problem # 1:  GERD (ICD-530.81)  GERD with a history of a peptic stricture. Symptoms under excellent control. Continue Nexium 40 mg by mouth every morning and standard antireflux measures.  Problem # 2:  PERSONAL HX COLONIC POLYPS (ICD-V12.72)  Prior history of adenomatous colon polyps with high-grade dysplasia in 1997. Piecemeal polypectomy of an adenomatous colon polyp from the hepatic flexure in 2009. Followup colonoscopy is recommended. Patient would like to defer this exam until.June to accommodate his work schedule and I feel this is reasonable.  Patient Instructions: 1)  Nexium has been sent to your pharmacy.  2)  We will send a letter to your home for your recall colonoscopy reminder in June 2012. 3)  The medication list was reviewed and reconciled.  All  changed / newly prescribed medications were explained.  A complete medication list was provided to the patient / caregiver.  Prescriptions: NEXIUM 40 MG  CPDR (ESOMEPRAZOLE MAGNESIUM) 1 capsule each day 30 minutes before meal  #30 x 11   Entered by:   Christie Nottingham CMA (AAMA)   Authorized by:   Meryl Dare MD [facility]   Signed by:   Christie Nottingham CMA (AAMA) on 02/25/2010   Method used:   Electronically to        [facility]       [address]       [place], [place]  [address]       Ph: [phone]       Fax: [phone]   RxID:   [id number]

## 2011-02-13 ENCOUNTER — Other Ambulatory Visit: Payer: Self-pay | Admitting: Gastroenterology

## 2011-02-13 NOTE — Telephone Encounter (Signed)
NEEDS OFFICE VISIT FOR ANY FURTHER REFILLS! 

## 2011-03-19 ENCOUNTER — Other Ambulatory Visit: Payer: Self-pay | Admitting: Gastroenterology

## 2011-03-20 MED ORDER — ESOMEPRAZOLE MAGNESIUM 40 MG PO CPDR: 40.0000 mg | DELAYED_RELEASE_CAPSULE | Freq: Every day | ORAL | Status: DC

## 2011-03-20 NOTE — Telephone Encounter (Signed)
Sent one refill to patient's pharmacy and notified patient to keep appt for any further refills.

## 2011-04-01 ENCOUNTER — Encounter: Payer: Self-pay | Admitting: Gastroenterology

## 2011-04-01 ENCOUNTER — Ambulatory Visit (INDEPENDENT_AMBULATORY_CARE_PROVIDER_SITE_OTHER): Payer: BC Managed Care – PPO | Admitting: Gastroenterology

## 2011-04-01 VITALS — BP 132/76 | HR 60 | Ht 72.0 in | Wt 222.0 lb

## 2011-04-01 DIAGNOSIS — K219 Gastro-esophageal reflux disease without esophagitis: Secondary | ICD-10-CM

## 2011-04-01 DIAGNOSIS — Z86010 Personal history of colonic polyps: Secondary | ICD-10-CM

## 2011-04-01 MED ORDER — ESOMEPRAZOLE MAGNESIUM 40 MG PO CPDR: 40.0000 mg | DELAYED_RELEASE_CAPSULE | Freq: Every day | ORAL | Status: DC

## 2011-04-01 NOTE — Progress Notes (Signed)
History of Present Illness: This is a 66 year old male with her and a history of esophageal stricture. Reflux symptoms are under excellent control on Nexium. He has not yet scheduled his followup colonoscopy. Denies weight loss, abdominal pain, constipation, diarrhea, change in stool caliber, melena, hematochezia, nausea, vomiting, dysphagia, reflux symptoms, chest pain.  Current Medications, Allergies, Past Medical History, Past Surgical History, Family History and Social History were reviewed in [employer] record.  Physical Exam: General: Well developed , well nourished, no acute distress Head: Normocephalic and atraumatic Eyes:  sclerae anicteric, EOMI Ears: Normal auditory acuity Mouth: No deformity or lesions Lungs: Clear throughout to auscultation Heart: Regular rate and rhythm; no murmurs, rubs or bruits Abdomen: Soft, non tender and non distended. No masses, hepatosplenomegaly or hernias noted. Normal Bowel sounds Rectal: Deferred to colonoscopy Musculoskeletal: Symmetrical with no gross deformities  Pulses:  Normal pulses noted Extremities: No clubbing, cyanosis, edema or deformities noted Neurological: Alert oriented x 4, grossly nonfocal Psychological:  Alert and cooperative. Normal mood and affect  Assessment and Recommendations:  1. GERD with a history of an esophageal stricture. Continue Nexium 40 mg daily and standard antireflux measures.  2. Personal history of adenomatous colon polyps with high-grade dysplasia in 1997 and piecemeal polypectomy of a hepatic flexure tubular adenomatous polyp in 2009. He was recommended to have colonoscopy in September 2011. We discussed this at his office visit in 2012 and he has not yet scheduled his colonoscopy. He states he will proceed with colonoscopy in mid to late June, as soon as the school year is over.

## 2011-04-01 NOTE — Patient Instructions (Signed)
We have sent the following medications to your pharmacy for you to pick up at your convenience: Nexium. You will be due for a recall colonoscopy in 06/2011. We will send you a reminder in the mail when it gets closer to that time. Please call in May to schedule your Colonoscopy for June because our schedule does get full. cc: Tally Joe, MD

## 2011-05-26 ENCOUNTER — Encounter: Payer: Self-pay | Admitting: Gastroenterology

## 2011-11-10 ENCOUNTER — Encounter: Payer: Self-pay | Admitting: Gastroenterology

## 2011-12-29 ENCOUNTER — Encounter: Payer: Self-pay | Admitting: Gastroenterology

## 2011-12-29 ENCOUNTER — Ambulatory Visit (AMBULATORY_SURGERY_CENTER): Payer: BC Managed Care – PPO

## 2011-12-29 VITALS — Ht 72.0 in | Wt 227.8 lb

## 2011-12-29 DIAGNOSIS — Z1211 Encounter for screening for malignant neoplasm of colon: Secondary | ICD-10-CM

## 2011-12-29 DIAGNOSIS — Z86010 Personal history of colonic polyps: Secondary | ICD-10-CM

## 2011-12-29 MED ORDER — MOVIPREP 100 G PO SOLR: ORAL | Status: DC

## 2012-01-04 ENCOUNTER — Encounter: Payer: Self-pay | Admitting: Gastroenterology

## 2012-01-04 ENCOUNTER — Ambulatory Visit (AMBULATORY_SURGERY_CENTER): Payer: BC Managed Care – PPO | Admitting: Gastroenterology

## 2012-01-04 VITALS — BP 122/74 | HR 52 | Temp 98.6°F | Resp 19 | Ht 72.0 in | Wt 227.0 lb

## 2012-01-04 DIAGNOSIS — Z860100 Personal history of colon polyps, unspecified: Secondary | ICD-10-CM

## 2012-01-04 DIAGNOSIS — D126 Benign neoplasm of colon, unspecified: Secondary | ICD-10-CM

## 2012-01-04 DIAGNOSIS — Z1211 Encounter for screening for malignant neoplasm of colon: Secondary | ICD-10-CM

## 2012-01-04 MED ORDER — SODIUM CHLORIDE 0.9 % IV SOLN: 500.0000 mL | INTRAVENOUS | Status: DC

## 2012-01-04 NOTE — Progress Notes (Signed)
Patient did not experience any of the following events: a burn prior to discharge; a fall within the facility; wrong site/side/patient/procedure/implant event; or a hospital transfer or hospital admission upon discharge from the facility. (G8907) Patient did not have preoperative order for IV antibiotic SSI prophylaxis. (G8918)  

## 2012-01-04 NOTE — Progress Notes (Signed)
Pt stable to RR Report to RN 

## 2012-01-04 NOTE — Progress Notes (Signed)
Encouraged pt to pass gas, pt refused said he felt like he was going to have a bowel movement, offered bedpan, pt refused, advised pt what he felt was probably just air, pt still refusing, continuing to encourage pt, once pt was off monitor, pt offered restroom.

## 2012-01-04 NOTE — Patient Instructions (Addendum)

## 2012-01-04 NOTE — Op Note (Signed)
[facility] [address] [place], [address]   COLONOSCOPY PROCEDURE REPORT  PATIENT: Jose Hines, Jose Hines  MR#: [id number] BIRTHDATE: [birth year] , 66  yrs. old GENDER: Male ENDOSCOPIST: Meryl Dare, MD, [facility] PROCEDURE DATE:  01/04/2012 PROCEDURE:   Colonoscopy with snare polypectomy ASA CLASS:   Class II INDICATIONS:Patient's personal history of adenomatous colon polyps.  MEDICATIONS: MAC sedation, administered by CRNA and propofol (Diprivan) 250mg  IV DESCRIPTION OF PROCEDURE:   After the risks benefits and alternatives of the procedure were thoroughly explained, informed consent was obtained.  A digital rectal exam revealed no abnormalities of the rectum.   The LB [id number]  endoscope was introduced through the anus and advanced to the cecum, which was identified by both the appendix and ileocecal valve. No adverse events experienced.   The quality of the prep was good, using MoviPrep  The instrument was then slowly withdrawn as the colon was fully examined.  COLON FINDINGS: Two sessile polyps measuring 4-5 mm in size was found in the transverse colon.  A polypectomy was performed with a cold snare.  The resection was complete and the polyp tissue was completely retrieved.   Moderate diverticulosis was noted in the sigmoid colon and descending colon.   The colon was otherwise normal.  There was no diverticulosis, inflammation, polyps or cancers unless previously stated.  Retroflexed views revealed no abnormalities. The time to cecum=1 minutes 14 seconds.  Withdrawal time=9 minutes 27 seconds.  The scope was withdrawn and the procedure completed.  COMPLICATIONS: There were no complications.  ENDOSCOPIC IMPRESSION: 1.   Two sessile polyp measuring 4-5 mm in the transverse colon; polypectomy performed with a cold snare 2.   Moderate diverticulosis was noted in the sigmoid colon and descending colon 3.   The colon was otherwise  normal  RECOMMENDATIONS: 1.  Hold aspirin, aspirin products, and anti-inflammatory medication for 2 weeks. 2.  High fiber diet with liberal fluid intake. 3.  Repeat Colonoscopy in 3 years.  eSigned:  Meryl Dare, MD, [facility] 01/04/2012 3:07 PM   cc: Tally Joe, MD

## 2012-01-04 NOTE — Progress Notes (Signed)
Called to room to assist during endoscopic procedure.  Patient ID and intended procedure confirmed with present staff. Received instructions for my participation in the procedure from the performing physician.  

## 2012-01-05 ENCOUNTER — Telehealth: Payer: Self-pay | Admitting: *Deleted

## 2012-01-05 NOTE — Telephone Encounter (Signed)
Left message on number given in admitting yesterday. [email or web address] 

## 2012-01-11 ENCOUNTER — Encounter: Payer: Self-pay | Admitting: Gastroenterology

## 2012-04-23 ENCOUNTER — Other Ambulatory Visit: Payer: Self-pay | Admitting: Gastroenterology

## 2012-05-23 ENCOUNTER — Other Ambulatory Visit: Payer: Self-pay | Admitting: Gastroenterology

## 2012-11-28 ENCOUNTER — Telehealth: Payer: Self-pay | Admitting: Gastroenterology

## 2012-11-28 NOTE — Telephone Encounter (Signed)
Called patient to let him know that I have started the PA for Nexium and will contact patient as soon as it is approved or denied.

## 2012-11-30 NOTE — Telephone Encounter (Signed)
Received fax that Nexium has been approved until 11/2013. Patient notified.

## 2013-04-12 ENCOUNTER — Ambulatory Visit: Payer: BC Managed Care – PPO | Admitting: Gastroenterology

## 2013-04-13 ENCOUNTER — Telehealth: Payer: Self-pay | Admitting: Gastroenterology

## 2013-04-13 MED ORDER — ESOMEPRAZOLE MAGNESIUM 40 MG PO CPDR: DELAYED_RELEASE_CAPSULE | ORAL | Status: DC

## 2013-04-13 NOTE — Telephone Encounter (Signed)
Informed patient he can get refills through his PCP and patient states he will just get them with [place]. Prescription sent to patient's pharmacy for one refill until appt.

## 2013-04-13 NOTE — Telephone Encounter (Signed)
Called patient with no answer and it states voice mail box is not set up.

## 2013-04-13 NOTE — Telephone Encounter (Signed)
1 refill ok. Please make him aware that since his GI problems are stable he can get his Nexium refills from his PCP and then he would not need to see [place] yearly, only for GI problems and colonoscopy.

## 2013-04-13 NOTE — Telephone Encounter (Signed)
Informed patient he is over due for an office visit since he has not been seen since 12/2011. Told patient if he would have kept his appt yesterday that he could of got he Nexium refills. Patient states he forgot about his appt yesterday and that it why he no showed. He rescheduled for 05/04/13 with Dr. Fuller Plan. Told patient I will ask Dr. Fuller Plan if we can give one refill until scheduled appt.

## 2013-04-13 NOTE — Telephone Encounter (Signed)
Called patient with no answer and voice mail box is not set up.

## 2013-05-04 ENCOUNTER — Encounter: Payer: Self-pay | Admitting: Gastroenterology

## 2013-05-04 ENCOUNTER — Ambulatory Visit (INDEPENDENT_AMBULATORY_CARE_PROVIDER_SITE_OTHER): Payer: BC Managed Care – PPO | Admitting: Gastroenterology

## 2013-05-04 VITALS — BP 124/80 | HR 74 | Ht 72.0 in | Wt 225.4 lb

## 2013-05-04 DIAGNOSIS — K219 Gastro-esophageal reflux disease without esophagitis: Secondary | ICD-10-CM

## 2013-05-04 DIAGNOSIS — Z86010 Personal history of colonic polyps: Secondary | ICD-10-CM

## 2013-05-04 MED ORDER — ESOMEPRAZOLE MAGNESIUM 40 MG PO CPDR: DELAYED_RELEASE_CAPSULE | ORAL | Status: DC

## 2013-05-04 NOTE — Progress Notes (Signed)
    History of Present Illness: This is a 68 year old male with a history of GERD and peptic stricture. Reflux symptoms are under very good control on Nexium 40 mg daily. Denies weight loss, abdominal pain, constipation, diarrhea, change in stool caliber, melena, hematochezia, nausea, vomiting, dysphagia, chest pain.  Current Medications, Allergies, Past Medical History, Past Surgical History, Family History and Social History were reviewed in [employer] record.  Physical Exam: General: Well developed , well nourished, no acute distress Head: Normocephalic and atraumatic Eyes:  sclerae anicteric, EOMI Ears: Normal auditory acuity Mouth: No deformity or lesions Lungs: Clear throughout to auscultation Heart: Regular rate and rhythm; no murmurs, rubs or bruits Abdomen: Soft, non tender and non distended. No masses, hepatosplenomegaly or hernias noted. Normal Bowel sounds Musculoskeletal: Symmetrical with no gross deformities  Extremities: No clubbing, cyanosis, edema or deformities noted Neurological: Alert oriented x 4, grossly nonfocal Psychological:  Alert and cooperative. Normal mood and affect  Assessment and Recommendations:  1. GERD with a history of a peptic stricture. Reflux under very good control. Continue standard antireflux measures and Nexium 40 mg every morning.  2. Personal history of adenomatous colon polyps. 3 year surveillance colonoscopy due December 2016.

## 2013-05-04 NOTE — Patient Instructions (Signed)
We have sent the following medications to your pharmacy for you to pick up at your convenience:Nexium.   Thank you for choosing me and [facility].  Malcolm T. Stark, Jr., MD., FACG   

## 2013-05-15 ENCOUNTER — Other Ambulatory Visit: Payer: Self-pay | Admitting: Family Medicine

## 2013-05-15 ENCOUNTER — Ambulatory Visit
Admission: RE | Admit: 2013-05-15 | Discharge: 2013-05-15 | Disposition: A | Discharge status: Home / Self Care | Payer: BC Managed Care – PPO | Source: Ambulatory Visit | Attending: Family Medicine | Admitting: Family Medicine

## 2013-05-15 DIAGNOSIS — M25559 Pain in unspecified hip: Secondary | ICD-10-CM

## 2013-07-24 ENCOUNTER — Telehealth: Payer: Self-pay | Admitting: Gastroenterology

## 2013-07-24 NOTE — Telephone Encounter (Signed)
Called [employer] to do a re-authorization of Nexium and patient was approved again from 06/2013- 07/2014. Pt notified.

## 2014-05-02 ENCOUNTER — Ambulatory Visit
Admission: RE | Admit: 2014-05-02 | Discharge: 2014-05-02 | Disposition: A | Discharge status: Home / Self Care | Payer: BC Managed Care – PPO | Source: Ambulatory Visit | Attending: Family Medicine | Admitting: Family Medicine

## 2014-05-02 ENCOUNTER — Other Ambulatory Visit: Payer: Self-pay | Admitting: Family Medicine

## 2014-05-02 DIAGNOSIS — R059 Cough, unspecified: Secondary | ICD-10-CM

## 2014-05-02 DIAGNOSIS — R05 Cough: Secondary | ICD-10-CM

## 2014-05-02 DIAGNOSIS — R0989 Other specified symptoms and signs involving the circulatory and respiratory systems: Secondary | ICD-10-CM

## 2014-05-26 ENCOUNTER — Other Ambulatory Visit: Payer: Self-pay | Admitting: Gastroenterology

## 2014-07-19 ENCOUNTER — Other Ambulatory Visit: Payer: Self-pay | Admitting: Gastroenterology

## 2014-08-08 ENCOUNTER — Encounter: Payer: Self-pay | Admitting: Gastroenterology

## 2014-08-08 ENCOUNTER — Ambulatory Visit (INDEPENDENT_AMBULATORY_CARE_PROVIDER_SITE_OTHER): Payer: BC Managed Care – PPO | Admitting: Gastroenterology

## 2014-08-08 VITALS — BP 130/60 | HR 82 | Ht 72.0 in | Wt 229.2 lb

## 2014-08-08 DIAGNOSIS — K219 Gastro-esophageal reflux disease without esophagitis: Secondary | ICD-10-CM | POA: Diagnosis not present

## 2014-08-08 DIAGNOSIS — R1314 Dysphagia, pharyngoesophageal phase: Secondary | ICD-10-CM | POA: Diagnosis not present

## 2014-08-08 DIAGNOSIS — Z86010 Personal history of colonic polyps: Secondary | ICD-10-CM

## 2014-08-08 DIAGNOSIS — R1319 Other dysphagia: Secondary | ICD-10-CM

## 2014-08-08 DIAGNOSIS — R1310 Dysphagia, unspecified: Secondary | ICD-10-CM

## 2014-08-08 NOTE — Progress Notes (Signed)
    History of Present Illness: This is a 69 year old male who has noted solid food dysphagia on several occasions over the past few weeks with meat. His reflux symptoms are under very good control on Nexium. Denies weight loss, abdominal pain, constipation, diarrhea, change in stool caliber, melena, hematochezia, nausea, vomiting, dysphagia, reflux symptoms, chest pain.   Current Medications, Allergies, Past Medical History, Past Surgical History, Family History and Social History were reviewed in [employer] record.  Physical Exam: General: Well developed , well nourished, no acute distress Head: Normocephalic and atraumatic Eyes:  sclerae anicteric, EOMI Ears: Normal auditory acuity Mouth: No deformity or lesions Lungs: Clear throughout to auscultation Heart: Regular rate and rhythm; no murmurs, rubs or bruits Abdomen: Soft, non tender and non distended. No masses, hepatosplenomegaly or hernias noted. Normal Bowel sounds Musculoskeletal: Symmetrical with no gross deformities  Pulses:  Normal pulses noted Extremities: No clubbing, cyanosis, edema or deformities noted Neurological: Alert oriented x 4, grossly nonfocal Psychological:  Alert and cooperative. Normal mood and affect  Assessment and Recommendations:  1. Dysphagia. Suspected recurrent esophageal stricture. Continue Nexium 40 mg daily and standard antireflux measures for GERD. Schedule EGD with dilation. The risks (including bleeding, perforation, infection, missed lesions, medication reactions and possible hospitalization or surgery if complications occur), benefits, and alternatives to endoscopy with possible biopsy and possible dilation were discussed with the patient and they consent to proceed.   2. Personal history of adenomatous colon polyps. Five-year interval surveillance colonoscopy due in December 2018.

## 2014-08-08 NOTE — Patient Instructions (Addendum)
You have been scheduled for an endoscopy. Please follow written instructions given to you at your visit today. If you use inhalers (even only as needed), please bring them with you on the day of your procedure. Your physician has requested that you go to www.startemmi.com and enter the access code given to you at your visit today. This web site gives a general overview about your procedure. However, you should still follow specific instructions given to you by our office regarding your preparation for the procedure.  Normal BMI (Body Mass Index- based on height and weight) is between 19 and 25. Your BMI today is Body mass index is 31.09 kg/(m^2). Marland Kitchen Please consider follow up  regarding your BMI with your Primary Care Provider.  Thank you for choosing me and [facility].  Pricilla Riffle. Dagoberto Ligas., MD., Marval Regal  cc: Antony Contras, MD

## 2014-08-21 ENCOUNTER — Encounter: Payer: Self-pay | Admitting: Gastroenterology

## 2014-08-21 ENCOUNTER — Ambulatory Visit (AMBULATORY_SURGERY_CENTER): Payer: BC Managed Care – PPO | Admitting: Gastroenterology

## 2014-08-21 VITALS — BP 109/68 | HR 67 | Temp 98.1°F | Resp 21 | Ht 72.0 in | Wt 229.0 lb

## 2014-08-21 DIAGNOSIS — K222 Esophageal obstruction: Secondary | ICD-10-CM | POA: Diagnosis not present

## 2014-08-21 DIAGNOSIS — R1310 Dysphagia, unspecified: Secondary | ICD-10-CM

## 2014-08-21 DIAGNOSIS — R1314 Dysphagia, pharyngoesophageal phase: Secondary | ICD-10-CM | POA: Diagnosis not present

## 2014-08-21 DIAGNOSIS — R1319 Other dysphagia: Secondary | ICD-10-CM

## 2014-08-21 MED ORDER — SODIUM CHLORIDE 0.9 % IV SOLN: 500.0000 mL | INTRAVENOUS | Status: DC

## 2014-08-21 NOTE — Patient Instructions (Signed)
YOU HAD AN ENDOSCOPIC PROCEDURE TODAY AT [facility]:   Refer to the procedure report that was given to you for any specific questions about what was found during the examination.  If the procedure report does not answer your questions, please call your gastroenterologist to clarify.  If you requested that your care partner not be given the details of your procedure findings, then the procedure report has been included in a sealed envelope for you to review at your convenience later.  YOU SHOULD EXPECT: Some feelings of bloating in the abdomen. Passage of more gas than usual.  Walking can help get rid of the air that was put into your GI tract during the procedure and reduce the bloating. If you had a lower endoscopy (such as a colonoscopy or flexible sigmoidoscopy) you may notice spotting of blood in your stool or on the toilet paper. If you underwent a bowel prep for your procedure, you may not have a normal bowel movement for a few days.  Please Note:  You might notice some irritation and congestion in your nose or some drainage.  This is from the oxygen used during your procedure.  There is no need for concern and it should clear up in a day or so.  SYMPTOMS TO REPORT IMMEDIATELY:   Following upper endoscopy (EGD)  Vomiting of blood or coffee ground material  New chest pain or pain under the shoulder blades  Painful or persistently difficult swallowing  New shortness of breath  Fever of 100F or higher  Black, tarry-looking stools  For urgent or emergent issues, a gastroenterologist can be reached at any hour by calling [phone].   DIET: Your first meal following the procedure should be a small meal and then it is ok to progress to your normal diet. Heavy or fried foods are harder to digest and may make you feel nauseous or bloated.  Likewise, meals heavy in dairy and vegetables can increase bloating.  Drink plenty of fluids but you should avoid alcoholic beverages for  24 hours.  ACTIVITY:  You should plan to take it easy for the rest of today and you should NOT DRIVE or use heavy machinery until tomorrow (because of the sedation medicines used during the test).    FOLLOW UP: Our staff will call the number listed on your records the next business day following your procedure to check on you and address any questions or concerns that you may have regarding the information given to you following your procedure. If we do not reach you, we will leave a message.  However, if you are feeling well and you are not experiencing any problems, there is no need to return our call.  We will assume that you have returned to your regular daily activities without incident.  If any biopsies were taken you will be contacted by phone or by letter within the next 1-3 weeks.  Please call us at [phone] if you have not heard about the biopsies in 3 weeks.    SIGNATURES/CONFIDENTIALITY: You and/or your care partner have signed paperwork which will be entered into your electronic medical record.  These signatures attest to the fact that that the information above on your After Visit Summary has been reviewed and is understood.  Full responsibility of the confidentiality of this discharge information lies with you and/or your care -partner.  Anti reflux as needed Follow post dilation diet

## 2014-08-21 NOTE — Progress Notes (Signed)
Report to PACU, RN, vss, BBS= Clear.  

## 2014-08-21 NOTE — Op Note (Signed)
[place]  [address]. [place], [address]   ENDOSCOPY PROCEDURE REPORT  PATIENT: Jose Hines, Jose Hines  MR#: [id number] BIRTHDATE: 1945-09-24 , 68  yrs. old GENDER: male ENDOSCOPIST: Ladene Artist, MD, [facility] PROCEDURE DATE:  08/21/2014 PROCEDURE:  EGD w/ wire guided (savary) dilation ASA CLASS:     Class III INDICATIONS:  dysphagia. MEDICATIONS: Monitored anesthesia care and Propofol 200 mg IV TOPICAL ANESTHETIC: none DESCRIPTION OF PROCEDURE: After the risks benefits and alternatives of the procedure were thoroughly explained, informed consent was obtained.  The LB [id number] endoscope was introduced through the mouth and advanced to the second portion of the duodenum , Without limitations.  The instrument was slowly withdrawn as the mucosa was fully examined.    ESOPHAGUS: There was a short benign appearing stricture, with an inner diameter of 5mm, at the gastroesophageal junction.  The stricture was easily traversable. The esophagus otherwise appeared normal. The stricture was dilated using a 36mm, 29mm, 17mm (45Fr) savary dilators over guidewire.  There was mild resistance to each dilator and no heme. STOMACH: The mucosa of the stomach appeared normal. DUODENUM: The duodenal mucosa showed no abnormalities in the bulb and 2nd part of the duodenum.  Retroflexed views revealed a small hiatal hernia.  The scope was then withdrawn from the patient and the procedure completed.  COMPLICATIONS: There were no immediate complications.  ENDOSCOPIC IMPRESSION: 1.   Stricture at the gastroesophageal junction; dilated using savary dilators over guidewire 2.   Small hiatal hernia 3.   The EGD otherwise appeared normal  RECOMMENDATIONS: 1.  Anti-reflux regimen long term 2.  Continue PPI long term 3.  Post dilation instructions  eSigned:  Ladene Artist, MD, [facility] 08/21/2014 3:26 PM

## 2014-08-21 NOTE — Progress Notes (Signed)
Called to room to assist during endoscopic procedure.  Patient ID and intended procedure confirmed with present staff. Received instructions for my participation in the procedure from the performing physician.  

## 2014-08-22 ENCOUNTER — Telehealth: Payer: Self-pay

## 2014-08-22 NOTE — Telephone Encounter (Signed)
Left a message at [phone] for the pt to call us back if any questions or concerns. [email or web address]

## 2014-12-27 ENCOUNTER — Encounter: Payer: Self-pay | Admitting: Gastroenterology

## 2015-04-16 ENCOUNTER — Ambulatory Visit
Admission: RE | Admit: 2015-04-16 | Discharge: 2015-04-16 | Disposition: A | Discharge status: Home / Self Care | Payer: BC Managed Care – PPO | Source: Ambulatory Visit | Attending: Family Medicine | Admitting: Family Medicine

## 2015-04-16 ENCOUNTER — Other Ambulatory Visit: Payer: Self-pay | Admitting: Family Medicine

## 2015-04-16 DIAGNOSIS — M79605 Pain in left leg: Secondary | ICD-10-CM

## 2015-07-16 ENCOUNTER — Encounter: Payer: Self-pay | Admitting: Gastroenterology

## 2015-08-20 ENCOUNTER — Encounter (INDEPENDENT_AMBULATORY_CARE_PROVIDER_SITE_OTHER): Payer: Self-pay

## 2015-08-20 ENCOUNTER — Ambulatory Visit (AMBULATORY_SURGERY_CENTER): Payer: BC Managed Care – PPO

## 2015-08-20 VITALS — Ht 72.0 in | Wt 228.6 lb

## 2015-08-20 DIAGNOSIS — Z86010 Personal history of colonic polyps: Secondary | ICD-10-CM

## 2015-08-20 MED ORDER — NA SULFATE-K SULFATE-MG SULF 17.5-3.13-1.6 GM/177ML PO SOLN: 1.0000 | Freq: Once | ORAL | 0 refills | Status: AC

## 2015-08-20 NOTE — Progress Notes (Signed)
Per pt, no allergies to soy or egg products.Pt not taking any weight loss meds or using  O2 at home. 

## 2015-09-03 ENCOUNTER — Encounter: Payer: BC Managed Care – PPO | Admitting: Gastroenterology

## 2015-09-04 ENCOUNTER — Encounter: Payer: Self-pay | Admitting: Gastroenterology

## 2015-09-18 ENCOUNTER — Ambulatory Visit (AMBULATORY_SURGERY_CENTER): Payer: BC Managed Care – PPO | Admitting: Gastroenterology

## 2015-09-18 ENCOUNTER — Encounter: Payer: Self-pay | Admitting: Gastroenterology

## 2015-09-18 VITALS — BP 118/72 | HR 70 | Temp 98.0°F | Resp 15 | Ht 72.0 in | Wt 228.0 lb

## 2015-09-18 DIAGNOSIS — D123 Benign neoplasm of transverse colon: Secondary | ICD-10-CM | POA: Diagnosis not present

## 2015-09-18 DIAGNOSIS — Z86010 Personal history of colonic polyps: Secondary | ICD-10-CM

## 2015-09-18 DIAGNOSIS — D124 Benign neoplasm of descending colon: Secondary | ICD-10-CM

## 2015-09-18 MED ORDER — SODIUM CHLORIDE 0.9 % IV SOLN: 500.0000 mL | INTRAVENOUS | Status: DC

## 2015-09-18 NOTE — Patient Instructions (Signed)
/  Impressions/recommendations:  Polyps (handout given) Diverticulosis (handout given) High Fiber Diet (handout given)  No aspirin, ibuprofen, naproxen, or other non-steroidal anti-inflammatory drugs for  Weeks. Tylenol only if needed until 9/28.  YOU HAD AN ENDOSCOPIC PROCEDURE TODAY AT [facility]:   Refer to the procedure report that was given to you for any specific questions about what was found during the examination.  If the procedure report does not answer your questions, please call your gastroenterologist to clarify.  If you requested that your care partner not be given the details of your procedure findings, then the procedure report has been included in a sealed envelope for you to review at your convenience later.  YOU SHOULD EXPECT: Some feelings of bloating in the abdomen. Passage of more gas than usual.  Walking can help get rid of the air that was put into your GI tract during the procedure and reduce the bloating. If you had a lower endoscopy (such as a colonoscopy or flexible sigmoidoscopy) you may notice spotting of blood in your stool or on the toilet paper. If you underwent a bowel prep for your procedure, you may not have a normal bowel movement for a few days.  Please Note:  You might notice some irritation and congestion in your nose or some drainage.  This is from the oxygen used during your procedure.  There is no need for concern and it should clear up in a day or so.  SYMPTOMS TO REPORT IMMEDIATELY:   Following lower endoscopy (colonoscopy or flexible sigmoidoscopy):  Excessive amounts of blood in the stool  Significant tenderness or worsening of abdominal pains  Swelling of the abdomen that is new, acute  Fever of 100F or higher   For urgent or emergent issues, a gastroenterologist can be reached at any hour by calling [phone].   DIET:  We do recommend a small meal at first, but then you may proceed to your regular diet.  Drink plenty of  fluids but you should avoid alcoholic beverages for 24 hours.  ACTIVITY:  You should plan to take it easy for the rest of today and you should NOT DRIVE or use heavy machinery until tomorrow (because of the sedation medicines used during the test).    FOLLOW UP: Our staff will call the number listed on your records the next business day following your procedure to check on you and address any questions or concerns that you may have regarding the information given to you following your procedure. If we do not reach you, we will leave a message.  However, if you are feeling well and you are not experiencing any problems, there is no need to return our call.  We will assume that you have returned to your regular daily activities without incident.  If any biopsies were taken you will be contacted by phone or by letter within the next 1-3 weeks.  Please call us at [phone] if you have not heard about the biopsies in 3 weeks.    SIGNATURES/CONFIDENTIALITY: You and/or your care partner have signed paperwork which will be entered into your electronic medical record.  These signatures attest to the fact that that the information above on your After Visit Summary has been reviewed and is understood.  Full responsibility of the confidentiality of this discharge information lies with you and/or your care-partner.

## 2015-09-18 NOTE — Op Note (Signed)
[place] Patient Name: Jose Hines Procedure Date: 09/18/2015 10:54 AM MRN: [id number] Endoscopist: Ladene Artist , MD Age: 70 Referring MD:  Date of Birth: 04/28/1945 Gender: Male Account #: [id number] Procedure:                Colonoscopy Indications:              Surveillance: Personal history of adenomatous                            polyps on last colonoscopy > 3 years ago Medicines:                Monitored Anesthesia Care Procedure:                Pre-Anesthesia Assessment:                           - Prior to the procedure, a History and Physical                            was performed, and patient medications and                            allergies were reviewed. The patient's tolerance of                            previous anesthesia was also reviewed. The risks                            and benefits of the procedure and the sedation                            options and risks were discussed with the patient.                            All questions were answered, and informed consent                            was obtained. Prior Anticoagulants: The patient has                            taken no previous anticoagulant or antiplatelet                            agents. ASA Grade Assessment: II - A patient with                            mild systemic disease. After reviewing the risks                            and benefits, the patient was deemed in                            satisfactory condition to undergo the procedure.  After obtaining informed consent, the colonoscope                            was passed under direct vision. Throughout the                            procedure, the patient's blood pressure, pulse, and                            oxygen saturations were monitored continuously. The                            Model PCF-[id number]) scope was introduced                            through the anus and  advanced to the the cecum,                            identified by appendiceal orifice and ileocecal                            valve. The ileocecal valve, appendiceal orifice,                            and rectum were photographed. The quality of the                            bowel preparation was adequate. The colonoscopy was                            performed without difficulty. The patient tolerated                            the procedure well. Scope In: 11:09:26 AM Scope Out: 11:28:52 AM Scope Withdrawal Time: 0 hours 17 minutes 12 seconds  Total Procedure Duration: 0 hours 19 minutes 26 seconds  Findings:                 A 15 mm polyp was found in the proximal transverse                            colon. The polyp was sessile. The polyp was removed                            with a hot snare. Resection and retrieval were                            complete.                           Three sessile polyps were found in the descending                            colon (1) and transverse colon (2). The polyps were  6 to 8 mm in size. These polyps were removed with a                            cold snare. Resection and retrieval were complete.                           A 4 mm polyp was found in the descending colon. The                            polyp was sessile. The polyp was removed with a                            cold biopsy forceps. Resection and retrieval were                            complete.                           Multiple medium-mouthed diverticula were found in                            the sigmoid colon and descending colon. There was                            narrowing of the colon in association with the                            diverticular opening.                           The exam was otherwise normal throughout the                            examined colon.                           The retroflexed view of the distal rectum  and anal                            verge was normal and showed no anal or rectal                            abnormalities. Complications:            No immediate complications. Estimated blood loss:                            None. Estimated Blood Loss:     Estimated blood loss: none. Impression:               - One 15 mm polyp in the proximal transverse colon,                            removed with a hot snare. Resected and retrieved.                           -  Three 6 to 8 mm polyps in the descending colon                            and in the transverse colon, removed with a cold                            snare. Resected and retrieved.                           - One 4 mm polyp in the descending colon, removed                            with a cold biopsy forceps. Resected and retrieved.                           - Moderate diverticulosis in the sigmoid colon and                            in the descending colon. There was narrowing of the                            colon in association with the diverticular opening.                           - The distal rectum and anal verge are normal on                            retroflexion view. Recommendation:           - Repeat colonoscopy in 3 years for surveillance.                           - Patient has a contact number available for                            emergencies. The signs and symptoms of potential                            delayed complications were discussed with the                            patient. Return to normal activities tomorrow.                            Written discharge instructions were provided to the                            patient.                           - High fiber diet.                           - Continue present medications.                           -  Await pathology results.                           - No aspirin, ibuprofen, naproxen, or other                            non-steroidal  anti-inflammatory drugs for 2 weeks                            after polyp removal. Ladene Artist, MD 09/18/2015 11:39:32 AM This report has been signed electronically.

## 2015-09-18 NOTE — Progress Notes (Signed)
Report to PACU, RN, vss, BBS= Clear.  

## 2015-09-18 NOTE — Progress Notes (Signed)
Called to room to assist during endoscopic procedure.  Patient ID and intended procedure confirmed with present staff. Received instructions for my participation in the procedure from the performing physician.  

## 2015-09-19 ENCOUNTER — Telehealth: Payer: Self-pay | Admitting: *Deleted

## 2015-09-19 NOTE — Telephone Encounter (Signed)
No answer. Name identifier. Message left to call if questions or concerns. 

## 2015-09-27 ENCOUNTER — Encounter: Payer: Self-pay | Admitting: Gastroenterology

## 2016-12-23 ENCOUNTER — Emergency Department (HOSPITAL_COMMUNITY)
Admission: EM | Admit: 2016-12-23 | Discharge: 2016-12-24 | Disposition: A | Discharge status: Home / Self Care | Payer: BC Managed Care – PPO | Attending: Emergency Medicine | Admitting: Emergency Medicine

## 2016-12-23 ENCOUNTER — Emergency Department (HOSPITAL_COMMUNITY): Payer: BC Managed Care – PPO

## 2016-12-23 DIAGNOSIS — S060X0A Concussion without loss of consciousness, initial encounter: Secondary | ICD-10-CM

## 2016-12-23 DIAGNOSIS — Y9389 Activity, other specified: Secondary | ICD-10-CM | POA: Diagnosis not present

## 2016-12-23 DIAGNOSIS — Z23 Encounter for immunization: Secondary | ICD-10-CM | POA: Diagnosis not present

## 2016-12-23 DIAGNOSIS — R05 Cough: Secondary | ICD-10-CM | POA: Insufficient documentation

## 2016-12-23 DIAGNOSIS — Z789 Other specified health status: Secondary | ICD-10-CM | POA: Diagnosis not present

## 2016-12-23 DIAGNOSIS — Z87891 Personal history of nicotine dependence: Secondary | ICD-10-CM | POA: Insufficient documentation

## 2016-12-23 DIAGNOSIS — Y9289 Other specified places as the place of occurrence of the external cause: Secondary | ICD-10-CM | POA: Diagnosis not present

## 2016-12-23 DIAGNOSIS — W01190A Fall on same level from slipping, tripping and stumbling with subsequent striking against furniture, initial encounter: Secondary | ICD-10-CM | POA: Insufficient documentation

## 2016-12-23 DIAGNOSIS — Z7982 Long term (current) use of aspirin: Secondary | ICD-10-CM | POA: Diagnosis not present

## 2016-12-23 DIAGNOSIS — S0993XA Unspecified injury of face, initial encounter: Secondary | ICD-10-CM | POA: Diagnosis present

## 2016-12-23 DIAGNOSIS — E876 Hypokalemia: Secondary | ICD-10-CM | POA: Insufficient documentation

## 2016-12-23 DIAGNOSIS — Z79899 Other long term (current) drug therapy: Secondary | ICD-10-CM | POA: Insufficient documentation

## 2016-12-23 DIAGNOSIS — Y999 Unspecified external cause status: Secondary | ICD-10-CM | POA: Insufficient documentation

## 2016-12-23 DIAGNOSIS — I10 Essential (primary) hypertension: Secondary | ICD-10-CM | POA: Insufficient documentation

## 2016-12-23 DIAGNOSIS — S0181XA Laceration without foreign body of other part of head, initial encounter: Secondary | ICD-10-CM | POA: Insufficient documentation

## 2016-12-23 DIAGNOSIS — Z7289 Other problems related to lifestyle: Secondary | ICD-10-CM

## 2016-12-23 MED ORDER — TETANUS-DIPHTH-ACELL PERTUSSIS 5-2.5-18.5 LF-MCG/0.5 IM SUSP
0.5000 mL | Freq: Once | INTRAMUSCULAR | Status: AC
  Administered 2016-12-24: 0.5 mL via INTRAMUSCULAR
  Filled 2016-12-23: qty 0.5

## 2016-12-23 NOTE — ED Triage Notes (Signed)
Pt BIB [facility] for fall. Pt was drinking his normal amount. He got up to go to bed tripped and struck his forehead on a chest of drawers. Denies LOC. No c/o besides the laceration. Towel roll in place. A+Ox4

## 2016-12-23 NOTE — ED Provider Notes (Signed)
[facility] Provider Note   CSN: [id number] Arrival date & time: 12/23/16  2300     History   Chief Complaint Chief Complaint  Patient presents with  . Fall  . Laceration    HPI Jose Hines is a 71 y.o. male.  The history is provided by the patient and the spouse.  Fall  This is a new problem. Episode onset: Just prior to arrival. The problem occurs constantly. The problem has been gradually worsening. Associated symptoms include headaches. Pertinent negatives include no chest pain and no abdominal pain. Nothing aggravates the symptoms. Nothing relieves the symptoms. He has tried nothing for the symptoms.  Laceration    Patient reports she was drinking his normal amount of alcohol tonight when he got up to go to bed and he tripped in his head He sustained a laceration to his above his right eye Denies LOC, but reports mild headache No neck pain, no chest pain, no abdominal pain He reports recent cough, and he was placed on a Z-Pak Past Medical History:  Diagnosis Date  . 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 Depression   . Diverticulosis   . GERD with stricture   . Hiatal hernia   . Hypertension   . Kidney stones    2 times  . Sleep apnea    wears CPAP  . Wears hearing aid    Bil    Patient Active Problem List   Diagnosis Date Noted  . GERD 01/09/2009  . PERSONAL HX COLONIC POLYPS 01/09/2009    Past Surgical History:  Procedure Laterality Date  . COLONOSCOPY    . INGUINAL HERNIA REPAIR    . POLYPECTOMY    . TONSILLECTOMY AND ADENOIDECTOMY         Home Medications    Prior to Admission medications   Medication Sig Start Date End Date Taking? Authorizing Provider  allopurinol (ZYLOPRIM) 300 MG tablet Take 300 mg by mouth daily.    [provider]  aspirin 81 MG tablet Take 81 mg by mouth daily.    [provider]  cyclobenzaprine (FLEXERIL) 10 MG tablet  12/28/11   [provider]    esomeprazole (NEXIUM) 40 MG capsule TAKE ONE CAPSULE BY MOUTH DAILY IN THE MORNING 07/19/14   Ladene Artist, MD  glucosamine-chondroitin 500-400 MG tablet Take 1 tablet by mouth daily.    [provider]  lisinopril-hydrochlorothiazide (PRINZIDE,ZESTORETIC) 10-12.5 MG per tablet Take 1 tablet by mouth daily.    [provider]  lovastatin (MEVACOR) 40 MG tablet Take 40 mg by mouth at bedtime.    [provider]  Multiple Vitamins-Iron (MULTIVITAMINS WITH IRON) TABS Take 1 tablet by mouth daily.    [provider]  tamsulosin (FLOMAX) 0.4 MG CAPS capsule Take 1 capsule by mouth daily. 08/04/14   [provider]    Family History Family History  Problem Relation Age of Onset  . Heart disease Father   . Transient ischemic attack Mother   . Colon cancer Neg Hx   . Esophageal cancer Neg Hx   . Rectal cancer Neg Hx   . Stomach cancer Neg Hx     Social History Social History   Tobacco Use  . Smoking status: Former Smoker    Types: Cigarettes    Last attempt to quit: 12/28/1968    Years since quitting: 48.0  . Smokeless tobacco: Never Used  Substance Use Topics  . Alcohol use: Yes  Alcohol/week: 8.4 oz    Types: 14 Standard drinks or equivalent per week    Comment: bourbon  . Drug use: No     Allergies   Patient has no known allergies.   Review of Systems Review of Systems  Constitutional: Negative for fever.  Respiratory: Positive for cough.   Cardiovascular: Negative for chest pain.  Gastrointestinal: Negative for abdominal pain.  Skin: Positive for wound.  Neurological: Positive for headaches.  All other systems reviewed and are negative.    Physical Exam Updated Vital Signs BP (!) 104/59 (BP Location: Right Arm)   Pulse 83   Resp 18   SpO2 95%   Physical Exam CONSTITUTIONAL: Well developed/well nourished HEAD: Large laceration above right eye, bleeding controlled EYES: EOMI/PERRL ENMT: Mucous membranes  moist, abrasion to bridge of nose, but no other new nasal or facial deformity NECK: supple no meningeal signs SPINE/BACK:entire spine nontender, no bruising/crepitance/stepoffs noted to spine CV: S1/S2 noted, no murmurs/rubs/gallops noted LUNGS: Lungs are clear to auscultation bilaterally, no apparent distress ABDOMEN: soft, nontender, no rebound or guarding, bowel sounds noted throughout abdomen GU:no cva tenderness NEURO: Pt is awake/alert/appropriate, moves all extremitiesx4.  No facial droop.   EXTREMITIES: pulses normal/equal, full ROM, all extremities/joints palpated/ranged and nontender SKIN: warm, color normal PSYCH: no abnormalities of mood noted, alert and oriented to situation   ED Treatments / Results  Labs (all labs ordered are listed, but only abnormal results are displayed) Labs Reviewed  BASIC METABOLIC PANEL - Abnormal; Notable for the following components:      Result Value   Potassium 3.0 (*)    Glucose, Bld 189 (*)    Calcium 8.8 (*)    All other components within normal limits  CBC WITH DIFFERENTIAL/PLATELET - Abnormal; Notable for the following components:   WBC 14.9 (*)    RBC 3.83 (*)    MCV 101.8 (*)    MCH 35.5 (*)    Neutro Abs 11.0 (*)    Monocytes Absolute 1.2 (*)    All other components within normal limits  ETHANOL - Abnormal; Notable for the following components:   Alcohol, Ethyl (B) 143 (*)    All other components within normal limits    EKG  EKG Interpretation None       Radiology Ct Head Wo Contrast  Result Date: 12/23/2016 CLINICAL DATA:  Pain following fall EXAM: CT HEAD WITHOUT CONTRAST TECHNIQUE: Contiguous axial images were obtained from the base of the skull through the vertex without intravenous contrast. COMPARISON:  None. FINDINGS: Brain: Mild to moderate generalized atrophy is present. There is no intracranial mass, hemorrhage, extra-axial fluid collection, or midline shift. There is slight small vessel disease in the centra  semiovale bilaterally. Elsewhere gray-white compartments appear normal. No evident acute infarct. Vascular: There is no appreciable hyperdense vessel. There is calcification in each carotid siphon region. Skull: Bony calvarium appears intact. There is air in the scalp over the right frontal region. Sinuses/Orbits: There is opacification of multiple ethmoid air cells. There is mucosal thickening in both maxillary antra. There is mucosal thickening in each anterior sphenoid sinus. Orbits appear symmetric bilaterally. Other: Mastoid air cells are clear. IMPRESSION: 1. Atrophy with mild periventricular small vessel disease. No acute infarct. No mass, hemorrhage, or extra-axial fluid collection. 2.  Foci of arterial vascular calcification. 3.  Right frontal scalp laceration. 4.  Areas of paranasal sinus disease. Electronically Signed   By: Lowella Grip III M.D.   On: 12/23/2016 23:50    Procedures .Marland Kitchen  Laceration Repair Date/Time: 12/24/2016 1:18 AM Performed by: Ripley Fraise, MD Authorized by: Ripley Fraise, MD   Consent:    Consent obtained:  Verbal   Consent given by:  Patient Laceration details:    Location: forehead - right above eyebrow.   Length (cm):  5 Repair type:    Repair type:  Intermediate Pre-procedure details:    Preparation:  Patient was prepped and draped in usual sterile fashion Exploration:    Wound exploration: entire depth of wound probed and visualized     Contaminated: no   Treatment:    Area cleansed with:  Shur-Clens and saline   Amount of cleaning:  Standard Skin repair:    Repair method:  Sutures   Suture material:  Prolene   Number of sutures:  10 (10 prolene, 1 vicryl) Approximation:    Approximation:  Close   Vermilion border: well-aligned   Post-procedure details:    Patient tolerance of procedure:  Tolerated well, no immediate complications      Medications Ordered in ED Medications  potassium chloride SA (K-DUR,KLOR-CON) CR tablet 40 mEq  (not administered)  Tdap (BOOSTRIX) injection 0.5 mL (0.5 mLs Intramuscular Given 12/24/16 0051)  lidocaine (PF) (XYLOCAINE) 1 % injection (5 mLs  Given 12/24/16 0052)     Initial Impression / Assessment and Plan / ED Course  I have reviewed the triage vital signs and the nursing notes.  Pertinent labs & imaging results that were available during my care of the patient were reviewed by me and considered in my medical decision making (see chart for details).     Deep wound, no foreign body noted He is able to open/close eyelid without difficulty and no visual changes   Discussed wound care   Due to persistent cough, will add on albuterol    Final Clinical Impressions(s) / ED Diagnoses   Final diagnoses:  Concussion without loss of consciousness, initial encounter  Laceration of forehead, initial encounter  Hypokalemia  Alcohol use    ED Discharge Orders        Ordered    albuterol (PROVENTIL HFA;VENTOLIN HFA) 108 (90 Base) MCG/ACT inhaler  Every 6 hours PRN     12/24/16 0117       Ripley Fraise, MD 12/24/16 0120

## 2016-12-24 LAB — BASIC METABOLIC PANEL
Anion gap: 11 (ref 5–15)
BUN: 12 mg/dL (ref 6–20)
CO2: 24 mmol/L (ref 22–32)
Calcium: 8.8 mg/dL — ABNORMAL LOW (ref 8.9–10.3)
Chloride: 102 mmol/L (ref 101–111)
Creatinine, Ser: 0.62 mg/dL (ref 0.61–1.24)
Glucose, Bld: 189 mg/dL — ABNORMAL HIGH (ref 65–99)
POTASSIUM: 3 mmol/L — AB (ref 3.5–5.1)
SODIUM: 137 mmol/L (ref 135–145)

## 2016-12-24 LAB — CBC WITH DIFFERENTIAL/PLATELET
BASOS ABS: 0 10*3/uL (ref 0.0–0.1)
Basophils Relative: 0 %
EOS ABS: 0 10*3/uL (ref 0.0–0.7)
EOS PCT: 0 %
HCT: 39 % (ref 39.0–52.0)
Hemoglobin: 13.6 g/dL (ref 13.0–17.0)
LYMPHS PCT: 18 %
Lymphs Abs: 2.7 10*3/uL (ref 0.7–4.0)
MCH: 35.5 pg — ABNORMAL HIGH (ref 26.0–34.0)
MCHC: 34.9 g/dL (ref 30.0–36.0)
MCV: 101.8 fL — AB (ref 78.0–100.0)
Monocytes Absolute: 1.2 10*3/uL — ABNORMAL HIGH (ref 0.1–1.0)
Monocytes Relative: 8 %
NEUTROS PCT: 74 %
Neutro Abs: 11 10*3/uL — ABNORMAL HIGH (ref 1.7–7.7)
Platelets: 241 10*3/uL (ref 150–400)
RBC: 3.83 MIL/uL — AB (ref 4.22–5.81)
RDW: 13.2 % (ref 11.5–15.5)
WBC: 14.9 10*3/uL — AB (ref 4.0–10.5)

## 2016-12-24 LAB — ETHANOL: ALCOHOL ETHYL (B): 143 mg/dL — AB (ref <10)

## 2016-12-24 MED ORDER — LIDOCAINE HCL (PF) 1 % IJ SOLN
INTRAMUSCULAR | Status: AC
  Administered 2016-12-24: 5 mL
  Filled 2016-12-24: qty 5

## 2016-12-24 MED ORDER — POTASSIUM CHLORIDE CRYS ER 20 MEQ PO TBCR
40.0000 meq | EXTENDED_RELEASE_TABLET | Freq: Once | ORAL | Status: AC
  Administered 2016-12-24: 40 meq via ORAL
  Filled 2016-12-24: qty 2

## 2016-12-24 MED ORDER — ALBUTEROL SULFATE HFA 108 (90 BASE) MCG/ACT IN AERS: 1.0000 | INHALATION_SPRAY | Freq: Four times a day (QID) | RESPIRATORY_TRACT | 0 refills | Status: DC | PRN

## 2017-03-22 ENCOUNTER — Ambulatory Visit
Admission: RE | Admit: 2017-03-22 | Discharge: 2017-03-22 | Disposition: A | Discharge status: Home / Self Care | Payer: BC Managed Care – PPO | Source: Ambulatory Visit | Attending: Family Medicine | Admitting: Family Medicine

## 2017-03-22 ENCOUNTER — Other Ambulatory Visit: Payer: Self-pay | Admitting: Family Medicine

## 2017-03-22 DIAGNOSIS — J209 Acute bronchitis, unspecified: Secondary | ICD-10-CM

## 2017-04-02 ENCOUNTER — Encounter: Payer: Self-pay | Admitting: Pulmonary Disease

## 2017-04-02 ENCOUNTER — Ambulatory Visit (INDEPENDENT_AMBULATORY_CARE_PROVIDER_SITE_OTHER): Payer: BC Managed Care – PPO | Admitting: Pulmonary Disease

## 2017-04-02 VITALS — BP 122/84 | HR 77 | Ht 72.0 in | Wt 211.0 lb

## 2017-04-02 DIAGNOSIS — J45909 Unspecified asthma, uncomplicated: Secondary | ICD-10-CM

## 2017-04-02 DIAGNOSIS — R053 Chronic cough: Secondary | ICD-10-CM

## 2017-04-02 DIAGNOSIS — R058 Other specified cough: Secondary | ICD-10-CM

## 2017-04-02 DIAGNOSIS — R05 Cough: Secondary | ICD-10-CM

## 2017-04-02 LAB — NITRIC OXIDE: Nitric Oxide: 36

## 2017-04-02 MED ORDER — MONTELUKAST SODIUM 10 MG PO TABS: 10.0000 mg | ORAL_TABLET | Freq: Every day | ORAL | 5 refills | Status: DC

## 2017-04-02 MED ORDER — LORATADINE 10 MG PO TABS: 10.0000 mg | ORAL_TABLET | Freq: Every day | ORAL | 11 refills | Status: DC

## 2017-04-02 MED ORDER — FLUTICASONE FUROATE 100 MCG/ACT IN AEPB: 1.0000 | INHALATION_SPRAY | Freq: Every day | RESPIRATORY_TRACT | 5 refills | Status: DC

## 2017-04-02 MED ORDER — FLUTICASONE PROPIONATE 50 MCG/ACT NA SUSP: 1.0000 | Freq: Every day | NASAL | 2 refills | Status: DC

## 2017-04-02 NOTE — Progress Notes (Signed)
   Subjective:    Patient ID: Jose Hines, male    DOB: [birth year], 72 y.o.   MRN: [id number]  HPI    Review of Systems  Constitutional: Positive for unexpected weight change. Negative for fever.  HENT: Positive for postnasal drip and sneezing. Negative for congestion, dental problem, ear pain, nosebleeds, rhinorrhea, sinus pressure, sore throat and trouble swallowing.   Eyes: Negative for redness and itching.  Respiratory: Positive for cough. Negative for chest tightness, shortness of breath and wheezing.   Cardiovascular: Negative for palpitations and leg swelling.  Gastrointestinal: Negative for nausea and vomiting.  Genitourinary: Negative for dysuria.  Musculoskeletal: Negative for joint swelling.  Skin: Negative for rash.  Allergic/Immunologic: Negative.  Negative for environmental allergies, food allergies and immunocompromised state.  Neurological: Negative for headaches.  Hematological: Does not bruise/bleed easily.  Psychiatric/Behavioral: Negative for dysphoric mood. The patient is not nervous/anxious.        Objective:   Physical Exam        Assessment & Plan:

## 2017-04-02 NOTE — Progress Notes (Signed)
[facility]  Chief Complaint  Patient presents with  . New Consult    coughing, productive, smoked 2 ppd for 10 years but quit 40 years ago    Vital signs: BP 122/84 (BP Location: Left Arm, Cuff Size: Normal)   Pulse 77   Ht 6' (1.829 m)   Wt 211 lb (95.7 kg)   SpO2 96%   BMI 28.62 kg/m   History of Present Illness: Jose Hines is a 72 y.o. male former smoker with a cough.  He gets intermittent episodes of cough.  He will bring up clear sputum.  These episodes can last several weeks.  He had a syncopal event related to cough during Christmas.  He gets nasal congestion and post nasal drip.  He denies ear pain or globus sensation.  No skin rashes.  He is allergic to grasses.  Never been told he has asthma.  He gets winded and wheezy at times.  Has tried albuterol, but not sure how much it helps.  Not using any nose sprays or allergy medications.  Has been on ACE inhibitor for years.  Has a pet dog.  Works as Public relations account executive.  He is from [place] [facility].  Never in [employer]  No other animial/bird exposures besides pet dog.  Was on Zpak and prednisone last week, and these helped some.  CXR from 03/22/17 was normal.  Spirometry today showed mild restriction and obstruction, but he had coughing spell.  FeNO today was elevated at 36.  Physical Exam:  General - pleasant Eyes - pupils reactive ENT - no sinus tenderness, clear nasal drainage, mild septum deviation, no oral exudate, no LAN, raspy voice Cardiac - regular, no murmur Chest - no wheeze, rales Abd - soft, non tender Ext - no edema Skin - no rashes Neuro - normal strength Psych - normal mood  Discussion: He has chronic, intermittent cough that seems to correlate to seasonal allergies.  He has persistent nasal congestion with post nasal drip, and eye irritation.  He has symptoms suggestive of acute asthma and he has elevated FeNO.  He has history of esophageal stricture, but reflux doesn't seem to  be a prominent issue at present.  He is also on ACE inhibitor, but his symptoms seem to fluctuate even though he has remained on ACE inhibitor.  I think it is okay for him to continue ACE inhibitor.  Assessment/Plan:  Acute asthma. - will have him use arnuity, singulair and prn albuterol - if symptoms persists, then consider full PFTs  Upper airway cough with post nasal drip. - claritin, singulair - nasal irrigation, flonase - if symptoms persist, then consider additional allergy testing   Patient Instructions  Nasal irrigation (saline nose spray) daily Flonase 1 spray each nostril daily Claritin (or generic equivalent) 10 mg pill daily; take at night since this might make you sleepy Singulair 10 mg pill nightly Arnuity one puff daily, and rinse mouth after each use Albuterol two puffs every four hours as needed for cough, wheeze, or chest congestion  Follow up in 3 weeks with Dr. Halford Chessman or Nurse practitioner    Jose Mires, MD [place] 04/02/2017, 11:28 AM Pager:  [phone]  Flow Sheet  Pulmonary tests: FeNO 04/02/17 >> 36 Spirometry 04/02/17 >> FEV 2.41 (70%), FEV1% 67%; coughing during test  Sleep tests: PSG 2003 >> RDI 67, SpO2 low 79%  Review of Systems: Constitutional: Positive for unexpected weight change. Negative for fever.  HENT: Positive for postnasal  drip and sneezing. Negative for congestion, dental problem, ear pain, nosebleeds, rhinorrhea, sinus pressure, sore throat and trouble swallowing.   Eyes: Negative for redness and itching.  Respiratory: Positive for cough. Negative for chest tightness, shortness of breath and wheezing.   Cardiovascular: Negative for palpitations and leg swelling.  Gastrointestinal: Negative for nausea and vomiting.  Genitourinary: Negative for dysuria.  Musculoskeletal: Negative for joint swelling.  Skin: Negative for rash.  Allergic/Immunologic: Negative.  Negative for environmental allergies, food  allergies and immunocompromised state.  Neurological: Negative for headaches.  Hematological: Does not bruise/bleed easily.  Psychiatric/Behavioral: Negative for dysphoric mood. The patient is not nervous/anxious.    Past Medical History: He  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), Depression, Diverticulosis, GERD with stricture, Hiatal hernia, Hypertension, Kidney stones, Sleep apnea, and Wears hearing aid.  Past Surgical History: He  has a past surgical history that includes Inguinal hernia repair; Tonsillectomy and adenoidectomy; Colonoscopy; and Polypectomy.  Family History: His family history includes Heart disease in his father; Transient ischemic attack in his mother.  Social History: He  reports that he quit smoking about 48 years ago. His smoking use included cigarettes. He has a 20.00 pack-year smoking history. He has never used smokeless tobacco. He reports that he drinks about 8.4 oz of alcohol per week. He reports that he does not use drugs.  Medications: Allergies as of 04/02/2017   No Known Allergies     Medication List        Accurate as of 04/02/17 11:28 AM. Always use your most recent med list.          albuterol 108 (90 Base) MCG/ACT inhaler Commonly known as:  PROVENTIL HFA;VENTOLIN HFA Inhale 1-2 puffs into the lungs every 6 (six) hours as needed for wheezing or shortness of breath.   allopurinol 300 MG tablet Commonly known as:  ZYLOPRIM Take 300 mg by mouth daily.   aspirin 81 MG tablet Take 81 mg by mouth daily.   cyclobenzaprine 10 MG tablet Commonly known as:  FLEXERIL   esomeprazole 40 MG capsule Commonly known as:  NEXIUM TAKE ONE CAPSULE BY MOUTH DAILY IN THE MORNING   fluticasone 50 MCG/ACT nasal spray Commonly known as:  FLONASE Place 1 spray into both nostrils daily.   Fluticasone Furoate 100 MCG/ACT Aepb Commonly known as:  ARNUITY ELLIPTA Inhale 1 puff into the lungs daily.   glucosamine-chondroitin  500-400 MG tablet Take 1 tablet by mouth daily.   lisinopril-hydrochlorothiazide 10-12.5 MG tablet Commonly known as:  PRINZIDE,ZESTORETIC Take 1 tablet by mouth daily.   loratadine 10 MG tablet Commonly known as:  CLARITIN Take 1 tablet (10 mg total) by mouth daily.   lovastatin 40 MG tablet Commonly known as:  MEVACOR Take 40 mg by mouth at bedtime.   montelukast 10 MG tablet Commonly known as:  SINGULAIR Take 1 tablet (10 mg total) by mouth at bedtime.   multivitamins with iron Tabs tablet Take 1 tablet by mouth daily.   tamsulosin 0.4 MG Caps capsule Commonly known as:  FLOMAX Take 1 capsule by mouth daily.

## 2017-04-02 NOTE — Patient Instructions (Signed)
Nasal irrigation (saline nose spray) daily Flonase 1 spray each nostril daily Claritin (or generic equivalent) 10 mg pill daily; take at night since this might make you sleepy Singulair 10 mg pill nightly Arnuity one puff daily, and rinse mouth after each use Albuterol two puffs every four hours as needed for cough, wheeze, or chest congestion  Follow up in 3 weeks with Dr. Halford Chessman or Nurse practitioner

## 2017-06-16 ENCOUNTER — Ambulatory Visit: Payer: BC Managed Care – PPO | Admitting: Pulmonary Disease

## 2017-06-16 ENCOUNTER — Encounter: Payer: Self-pay | Admitting: Pulmonary Disease

## 2017-06-16 VITALS — BP 110/74 | HR 57 | Ht 72.0 in | Wt 211.8 lb

## 2017-06-16 DIAGNOSIS — J309 Allergic rhinitis, unspecified: Secondary | ICD-10-CM

## 2017-06-16 DIAGNOSIS — J4540 Moderate persistent asthma, uncomplicated: Secondary | ICD-10-CM

## 2017-06-16 NOTE — Progress Notes (Signed)
[facility]  Chief Complaint  Patient presents with  . Follow-up    Pt has dry cough, wheezing, SOB with exertion.    Vital signs: BP 110/74 (BP Location: Left Arm, Cuff Size: Normal)   Pulse (!) 57   Ht 6' (1.829 m)   Wt 211 lb 12.8 oz (96.1 kg)   SpO2 99%   BMI 28.73 kg/m   History of Present Illness: Jose Hines is a 72 y.o. male former smoker with allergic rhinitis and asthma.  His cough is better.  He feels medications have helped.  Not having wheeze, sputum, chest tightness.  Hasn't been using meds daily.  Worse on days he doesn't use meds.  Physical Exam:  General - pleasant Eyes - pupils reactive ENT - no sinus tenderness, no oral exudate, no LAN, deviated septum Cardiac - regular, no murmur Chest - no wheeze, rales Abd - soft, non tender Ext - no edema Skin - no rashes Neuro - normal strength Psych - normal mood   Assessment/Plan:  Allergic asthma. - discussed roles of his medications - arnuity and singulair daily - prn albuterol  Allergic rhinitis. - flonase, singulair - prn claritin   Patient Instructions  Arnuity one puff daily, and rinse mouth after each use Flonase 1 spray each nostril daily Singulair 10 mg pill nightly Follow up in 4 months    Chesley Mires, MD [place] 06/16/2017, 9:21 AM Pager:  [phone]  Flow Sheet  Pulmonary tests: FeNO 04/02/17 >> 36 Spirometry 04/02/17 >> FEV 2.41 (70%), FEV1% 67%; coughing during test  Sleep tests: PSG 2003 >> RDI 67, SpO2 low 79%  Past Medical History: He  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), Depression, Diverticulosis, GERD with stricture, Hiatal hernia, Hypertension, Kidney stones, Sleep apnea, and Wears hearing aid.  Past Surgical History: He  has a past surgical history that includes Inguinal hernia repair; Tonsillectomy and adenoidectomy; Colonoscopy; and Polypectomy.  Family History: His  family history includes Heart disease in his father; Transient ischemic attack in his mother.  Social History: He  reports that he quit smoking about 48 years ago. His smoking use included cigarettes. He has a 20.00 pack-year smoking history. He has never used smokeless tobacco. He reports that he drinks about 8.4 oz of alcohol per week. He reports that he does not use drugs.  Medications: Allergies as of 06/16/2017   No Known Allergies     Medication List        Accurate as of 06/16/17  9:21 AM. Always use your most recent med list.          albuterol 108 (90 Base) MCG/ACT inhaler Commonly known as:  PROVENTIL HFA;VENTOLIN HFA Inhale 1-2 puffs into the lungs every 6 (six) hours as needed for wheezing or shortness of breath.   allopurinol 300 MG tablet Commonly known as:  ZYLOPRIM Take 300 mg by mouth daily.   aspirin 81 MG tablet Take 81 mg by mouth daily.   cyclobenzaprine 10 MG tablet Commonly known as:  FLEXERIL   esomeprazole 40 MG capsule Commonly known as:  NEXIUM TAKE ONE CAPSULE BY MOUTH DAILY IN THE MORNING   fluticasone 50 MCG/ACT nasal spray Commonly known as:  FLONASE Place 1 spray into both nostrils daily.   Fluticasone Furoate 100 MCG/ACT Aepb Commonly known as:  ARNUITY ELLIPTA Inhale 1 puff into the lungs daily.   glucosamine-chondroitin 500-400 MG tablet Take 1 tablet by mouth daily.  lisinopril-hydrochlorothiazide 10-12.5 MG tablet Commonly known as:  PRINZIDE,ZESTORETIC Take 1 tablet by mouth daily.   loratadine 10 MG tablet Commonly known as:  CLARITIN Take 1 tablet (10 mg total) by mouth daily.   lovastatin 40 MG tablet Commonly known as:  MEVACOR Take 40 mg by mouth at bedtime.   montelukast 10 MG tablet Commonly known as:  SINGULAIR Take 1 tablet (10 mg total) by mouth at bedtime.   multivitamins with iron Tabs tablet Take 1 tablet by mouth daily.   tamsulosin 0.4 MG Caps capsule Commonly known as:  FLOMAX Take 1 capsule by  mouth daily.

## 2017-06-16 NOTE — Patient Instructions (Signed)
Arnuity one puff daily, and rinse mouth after each use Flonase 1 spray each nostril daily Singulair 10 mg pill nightly Follow up in 4 months

## 2017-10-05 ENCOUNTER — Other Ambulatory Visit: Payer: Self-pay | Admitting: Pulmonary Disease

## 2018-01-27 ENCOUNTER — Other Ambulatory Visit: Payer: Self-pay | Admitting: Pulmonary Disease

## 2018-05-25 ENCOUNTER — Encounter: Payer: Self-pay | Admitting: Neurology

## 2018-06-13 ENCOUNTER — Emergency Department (HOSPITAL_COMMUNITY): Payer: BC Managed Care – PPO

## 2018-06-13 ENCOUNTER — Other Ambulatory Visit: Payer: Self-pay

## 2018-06-13 ENCOUNTER — Encounter (HOSPITAL_COMMUNITY): Payer: Self-pay | Admitting: Emergency Medicine

## 2018-06-13 ENCOUNTER — Emergency Department (HOSPITAL_COMMUNITY)
Admission: EM | Admit: 2018-06-13 | Discharge: 2018-06-14 | Disposition: A | Discharge status: Home / Self Care | Payer: BC Managed Care – PPO | Attending: Emergency Medicine | Admitting: Emergency Medicine

## 2018-06-13 DIAGNOSIS — Z79899 Other long term (current) drug therapy: Secondary | ICD-10-CM | POA: Insufficient documentation

## 2018-06-13 DIAGNOSIS — J45909 Unspecified asthma, uncomplicated: Secondary | ICD-10-CM | POA: Diagnosis not present

## 2018-06-13 DIAGNOSIS — R0602 Shortness of breath: Secondary | ICD-10-CM | POA: Diagnosis not present

## 2018-06-13 DIAGNOSIS — Z20828 Contact with and (suspected) exposure to other viral communicable diseases: Secondary | ICD-10-CM | POA: Diagnosis not present

## 2018-06-13 DIAGNOSIS — Z7982 Long term (current) use of aspirin: Secondary | ICD-10-CM | POA: Diagnosis not present

## 2018-06-13 DIAGNOSIS — Z87891 Personal history of nicotine dependence: Secondary | ICD-10-CM | POA: Diagnosis not present

## 2018-06-13 DIAGNOSIS — R05 Cough: Secondary | ICD-10-CM | POA: Diagnosis present

## 2018-06-13 MED ORDER — AEROCHAMBER PLUS FLO-VU MEDIUM MISC
1.0000 | Freq: Once | Status: AC
  Administered 2018-06-13: 1
  Filled 2018-06-13: qty 1

## 2018-06-13 MED ORDER — METHYLPREDNISOLONE SODIUM SUCC 125 MG IJ SOLR
125.0000 mg | Freq: Once | INTRAMUSCULAR | Status: AC
  Administered 2018-06-14: 125 mg via INTRAVENOUS
  Filled 2018-06-13: qty 2

## 2018-06-13 MED ORDER — ALBUTEROL SULFATE HFA 108 (90 BASE) MCG/ACT IN AERS
4.0000 | INHALATION_SPRAY | Freq: Once | RESPIRATORY_TRACT | Status: AC
  Administered 2018-06-13: 4 via RESPIRATORY_TRACT
  Filled 2018-06-13: qty 6.7

## 2018-06-13 NOTE — ED Provider Notes (Signed)
73 year old male with 1 week history of nonproductive cough with some dyspnea.  On exam, he has significant wheezing throughout his lungs.  He is afebrile.  Chest x-ray shows no pneumonia.  There is only minimal improvement with albuterol inhaler.  Will screen for COVID-19.  Will give additional albuterol as well as steroids and magnesium.  2:47 AM He has received 2 rounds of albuterol via inhaler as well as magnesium.  He seems to be resting more comfortably.  Air movement is improved and wheezing is decreased, but still residual wheezing which is greater on the left.  Chest x-ray shows no evidence of pneumonia.  COVID-19 test is negative.  Will be given a nebulizer treatment with albuterol and ipratropium.  3:34 AM Following nebulizer treatment, he feels much better.  On exam, there is minimal residual wheezing at the left base.  He still seems somewhat dyspneic at rest, but was anxious to go home.  He was ambulated in the ED, and oxygen saturation did drop to 90%, but no lower.  Since he has started his prednisone, it was felt that he would probably do well at home.  He is advised to continue using the inhaler as needed and is given a prescription for prednisone.  Strict return precautions were given.  Patient expresses understanding.   Delora Fuel, MD 72/53/66 [phone]

## 2018-06-13 NOTE — ED Triage Notes (Signed)
Pt reports that sent by PCP to rule out PNA, pt had cough, SOB for several days.

## 2018-06-13 NOTE — ED Provider Notes (Signed)
[facility] DEPT Provider Note   CSN: [id number] Arrival date & time: 06/13/18  1638    History   Chief Complaint Chief Complaint  Patient presents with  . Cough  . Shortness of Breath    HPI Jose Hines is a 73 y.o. male w/ a hx of former tobacco use, HTN, GERD, nephrolithiasis, & sleep apnea who presents to the ED w/ complaints of cough x 1 week & dyspnea x 3-4  days.  Patient reports he has had productive cough with white phlegm sputum production that has been progressively worsening.  He notes that over the past 3-4 days he has developed dyspnea which is fairly constant w/o alleviating/aggravating factors. Notes associated nasal congestion & wheezing. No intervention PTA. PCP sent to the ED for CXR & COVID swab. Denies fever, chills, hemoptysis, chest pain/tightness, abdominal pain, leg pain/swelling, recent travel/surgery/trauma, or personal hx of DVT/PE or cancer. No sick contacts w/ similar. NO known lab confirmed covid 19 exposures. Smoked > 30 years ago, no formal diagnoses of COPD/asthma, no active tobacco use.     HPI  Past Medical History:  Diagnosis Date  . 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 Depression   . Diverticulosis   . GERD with stricture   . Hiatal hernia   . Hypertension   . Kidney stones    2 times  . Sleep apnea    wears CPAP  . Wears hearing aid    Bil    Patient Active Problem List   Diagnosis Date Noted  . GERD 01/09/2009  . PERSONAL HX COLONIC POLYPS 01/09/2009    Past Surgical History:  Procedure Laterality Date  . COLONOSCOPY    . INGUINAL HERNIA REPAIR    . POLYPECTOMY    . TONSILLECTOMY AND ADENOIDECTOMY          Home Medications    Prior to Admission medications   Medication Sig Start Date End Date Taking? Authorizing Provider  albuterol (PROVENTIL HFA;VENTOLIN HFA) 108 (90 Base) MCG/ACT inhaler Inhale 1-2 puffs into the lungs every 6 (six) hours as needed for wheezing or shortness of breath.  12/24/16   Ripley Fraise, MD  allopurinol (ZYLOPRIM) 300 MG tablet Take 300 mg by mouth daily.    [provider]  aspirin 81 MG tablet Take 81 mg by mouth daily.    [provider]  cyclobenzaprine (FLEXERIL) 10 MG tablet  12/28/11   [provider]  esomeprazole (NEXIUM) 40 MG capsule TAKE ONE CAPSULE BY MOUTH DAILY IN THE MORNING 07/19/14   Ladene Artist, MD  fluticasone (FLONASE) 50 MCG/ACT nasal spray Place 1 spray into both nostrils daily. 04/02/17   Chesley Mires, MD  Fluticasone Furoate (ARNUITY ELLIPTA) 100 MCG/ACT AEPB Inhale 1 puff into the lungs daily. 04/02/17   Chesley Mires, MD  glucosamine-chondroitin 500-400 MG tablet Take 1 tablet by mouth daily.    [provider]  lisinopril-hydrochlorothiazide (PRINZIDE,ZESTORETIC) 10-12.5 MG per tablet Take 1 tablet by mouth daily.    [provider]  loratadine (CLARITIN) 10 MG tablet Take 1 tablet (10 mg total) by mouth daily. 04/02/17   Chesley Mires, MD  lovastatin (MEVACOR) 40 MG tablet Take 40 mg by mouth at bedtime.    [provider]  montelukast (SINGULAIR) 10 MG tablet TAKE ONE TABLET BY MOUTH AT BEDTIME 01/28/18   Chesley Mires, MD  Multiple Vitamins-Iron (MULTIVITAMINS WITH IRON) TABS Take 1 tablet by mouth daily.    [provider]  tamsulosin [facility])  0.4 MG CAPS capsule Take 1 capsule by mouth daily. 08/04/14   [provider]    Family History Family History  Problem Relation Age of Onset  . Heart disease Father   . Transient ischemic attack Mother   . Colon cancer Neg Hx   . Esophageal cancer Neg Hx   . Rectal cancer Neg Hx   . Stomach cancer Neg Hx     Social History Social History   Tobacco Use  . Smoking status: Former Smoker    Packs/day: 2.00    Years: 10.00    Pack years: 20.00    Types: Cigarettes    Last attempt to quit: 12/28/1968    Years since quitting: 49.4  . Smokeless tobacco: Never Used  Substance Use Topics  . Alcohol use:  Yes    Alcohol/week: 14.0 standard drinks    Types: 14 Standard drinks or equivalent per week    Comment: bourbon  . Drug use: No     Allergies   Patient has no known allergies.   Review of Systems Review of Systems  Constitutional: Negative for chills and fever.  HENT: Positive for congestion. Negative for ear pain and sore throat.   Respiratory: Positive for cough, shortness of breath and wheezing. Negative for chest tightness.   Cardiovascular: Negative for chest pain, palpitations and leg swelling.  Gastrointestinal: Negative for abdominal pain and vomiting.  Musculoskeletal: Negative for myalgias.  Neurological: Negative for syncope.  All other systems reviewed and are negative.  Physical Exam Updated Vital Signs BP (!) 141/85 (BP Location: Left Arm)   Pulse 74   Temp 98.5 F (36.9 C)   Resp 19   SpO2 96%   Physical Exam Vitals signs and nursing note reviewed.  Constitutional:      General: He is not in acute distress.    Appearance: He is well-developed. He is not toxic-appearing.  HENT:     Head: Normocephalic and atraumatic.     Nose: Congestion present.     Mouth/Throat:     Pharynx: Oropharynx is clear.  Eyes:     General:        Right eye: No discharge.        Left eye: No discharge.     Conjunctiva/sclera: Conjunctivae normal.  Neck:     Musculoskeletal: Neck supple. No edema or neck rigidity.  Cardiovascular:     Rate and Rhythm: Normal rate and regular rhythm.     Pulses:          Dorsalis pedis pulses are 2+ on the right side and 2+ on the left side.  Pulmonary:     Effort: No respiratory distress or retractions.     Breath sounds: No stridor.     Comments: Poor air movement throughout w/ expiratory wheeze throughout. SpO2 91-96% on RA.  Abdominal:     General: There is no distension.     Palpations: Abdomen is soft.     Tenderness: There is no abdominal tenderness.  Musculoskeletal:     Right lower leg: He exhibits no tenderness. No edema.      Left lower leg: He exhibits no tenderness. No edema.  Lymphadenopathy:     Cervical: No cervical adenopathy.  Skin:    General: Skin is warm and dry.     Findings: No rash.  Neurological:     Mental Status: He is alert.     Comments: Clear speech.   Psychiatric:        Behavior:  Behavior normal.    ED Treatments / Results  Labs (all labs ordered are listed, but only abnormal results are displayed) Labs Reviewed  BASIC METABOLIC PANEL - Abnormal; Notable for the following components:      Result Value   Glucose, Bld 123 (*)    Creatinine, Ser 0.57 (*)    All other components within normal limits  SARS CORONAVIRUS 2 (HOSPITAL ORDER, [place] [facility])  CBC WITH DIFFERENTIAL/PLATELET    EKG None  Radiology Dg Chest 2 View  Result Date: 06/13/2018 CLINICAL DATA:  Productive cough.  Shortness of breath. EXAM: CHEST - 2 VIEW COMPARISON:  Radiographs of March 22, 2017. FINDINGS: The heart size and mediastinal contours are within normal limits. Stable minimal bibasilar scarring is noted. No acute abnormality is noted. No pneumothorax or pleural effusion is noted. The visualized skeletal structures are unremarkable. IMPRESSION: No active cardiopulmonary disease. Electronically Signed   By: Marijo Conception M.D.   On: 06/13/2018 17:28    Procedures Procedures (including critical care time)  Medications Ordered in ED Medications - No data to display   Initial Impression / Assessment and Plan / ED Course  I have reviewed the triage vital signs and the nursing notes.  Pertinent labs & imaging results that were available during my care of the patient were reviewed by me and considered in my medical decision making (see chart for details).   Patient presents to the ED w/ cough/dyspnea. Nontoxic appearing, afebrile, normotensive, SpO2 91-96% on RA but does not appear in overt respiratory distress. Lung exam w/ poor air movement, tight breath sounds, expiratory  wheeze throughout. Otherwise exam is fairly benign. CXR per triage negative for acute process- no pneumonia, pneumothorax, or signs of fluid overload. Plan for basic labs, rapid covid swab, will administer albuterol inhaler w/ spacer, steroids, & mag w/ plan for re-assessment.   CBC: no leukocytosis, hgb WNL BMP: mild hyperglycemia @ 123. No significant electrolyte disturbance.   01:02: RE-EVAL: Patient remains w/ expiratory wheezing throughout.   01:00 Patient care transitioned to supervising physician Dr. Roxanne Mins who has evaluated patient, pending covid 19 testing, re-assessment, & disposition.   Atlee GADDIEL CULLENS was evaluated in Emergency Department on 06/14/2018 for the symptoms described in the history of present illness. He/she was evaluated in the context of the global COVID-19 pandemic, which necessitated consideration that the patient might be at risk for infection with the SARS-CoV-2 virus that causes COVID-19. Institutional protocols and algorithms that pertain to the evaluation of patients at risk for COVID-19 are in a state of rapid change based on information released by regulatory bodies including the CDC and federal and state organizations. These policies and algorithms were followed during the patient's care in the ED.  Final Clinical Impressions(s) / ED Diagnoses   Final diagnoses:  None    ED Discharge Orders    None       Amaryllis Dyke, PA-C 24/23/53 [id number]    Delora Fuel, MD 31/54/00 [phone]

## 2018-06-14 LAB — CBC WITH DIFFERENTIAL/PLATELET
Abs Immature Granulocytes: 0.03 10*3/uL (ref 0.00–0.07)
Basophils Absolute: 0.1 10*3/uL (ref 0.0–0.1)
Basophils Relative: 1 %
Eosinophils Absolute: 1 10*3/uL — ABNORMAL HIGH (ref 0.0–0.5)
Eosinophils Relative: 12 %
HCT: 38.7 % — ABNORMAL LOW (ref 39.0–52.0)
Hemoglobin: 13.1 g/dL (ref 13.0–17.0)
Immature Granulocytes: 0 %
Lymphocytes Relative: 22 %
Lymphs Abs: 1.8 10*3/uL (ref 0.7–4.0)
MCH: 35.4 pg — ABNORMAL HIGH (ref 26.0–34.0)
MCHC: 33.9 g/dL (ref 30.0–36.0)
MCV: 104.6 fL — ABNORMAL HIGH (ref 80.0–100.0)
Monocytes Absolute: 1 10*3/uL (ref 0.1–1.0)
Monocytes Relative: 12 %
Neutro Abs: 4.4 10*3/uL (ref 1.7–7.7)
Neutrophils Relative %: 53 %
Platelets: 216 10*3/uL (ref 150–400)
RBC: 3.7 MIL/uL — ABNORMAL LOW (ref 4.22–5.81)
RDW: 13.2 % (ref 11.5–15.5)
WBC: 8.2 10*3/uL (ref 4.0–10.5)
nRBC: 0 % (ref 0.0–0.2)

## 2018-06-14 LAB — BASIC METABOLIC PANEL
Anion gap: 8 (ref 5–15)
BUN: 11 mg/dL (ref 8–23)
CO2: 27 mmol/L (ref 22–32)
Calcium: 9.1 mg/dL (ref 8.9–10.3)
Chloride: 103 mmol/L (ref 98–111)
Creatinine, Ser: 0.57 mg/dL — ABNORMAL LOW (ref 0.61–1.24)
GFR calc Af Amer: >60 mL/min (ref >60)
GFR calc non Af Amer: >60 mL/min (ref >60)
Glucose, Bld: 123 mg/dL — ABNORMAL HIGH (ref 70–99)
Potassium: 3.7 mmol/L (ref 3.5–5.1)
Sodium: 138 mmol/L (ref 135–145)

## 2018-06-14 LAB — SARS CORONAVIRUS 2 BY RT PCR (HOSPITAL ORDER, PERFORMED IN ~~LOC~~ HOSPITAL LAB): SARS Coronavirus 2: NEGATIVE

## 2018-06-14 MED ORDER — ALBUTEROL SULFATE HFA 108 (90 BASE) MCG/ACT IN AERS
4.0000 | INHALATION_SPRAY | Freq: Once | RESPIRATORY_TRACT | Status: AC
  Administered 2018-06-14: 01:00:00 4 via RESPIRATORY_TRACT

## 2018-06-14 MED ORDER — PREDNISONE 50 MG PO TABS: 50.0000 mg | ORAL_TABLET | Freq: Every day | ORAL | 0 refills | Status: DC

## 2018-06-14 MED ORDER — MAGNESIUM SULFATE 2 GM/50ML IV SOLN
2.0000 g | Freq: Once | INTRAVENOUS | Status: AC
  Administered 2018-06-14: 2 g via INTRAVENOUS
  Filled 2018-06-14: qty 50

## 2018-06-14 MED ORDER — IPRATROPIUM-ALBUTEROL 0.5-2.5 (3) MG/3ML IN SOLN
3.0000 mL | Freq: Once | RESPIRATORY_TRACT | Status: AC
  Administered 2018-06-14: 3 mL via RESPIRATORY_TRACT
  Filled 2018-06-14: qty 3

## 2018-06-14 NOTE — Discharge Instructions (Addendum)
Use the inhaler as needed - two puffs every four hours for cough or difficulty breathing or wheezing.  Return if your symptoms are getting worse, or if the inhaler is not helping.

## 2018-06-14 NOTE — ED Notes (Signed)
Pt stating he does not want to stay any longer, asking to be discharged. Dr. Roxanne Mins made aware.

## 2018-06-14 NOTE — ED Notes (Signed)
Pt ambulated with pulse oximetry, pt dropped to 89% at lowest and stayed around 90-91%. Pt refused any dizziness, shortness of breath, or lightheadedness. Pt stating he feels okay to go home and does not want to stay. Dr. Roxanne Mins made aware.

## 2018-06-14 NOTE — ED Notes (Signed)
Pt ambulated to the restroom and back with no difficulty

## 2018-07-07 NOTE — Progress Notes (Signed)
[facility] Clinic Note - Initial Visit   Date: 07/11/18  Jose Hines MRN: [id number] DOB: February 05, 1945   Dear Dr. Moreen Fowler:  Thank you for your kind referral of Jose Hines for consultation of leg weakness. Although his history is well known to you, please allow [place] to reiterate it for the purpose of our medical record. The patient was accompanied to the clinic by self.    History of Present Illness: Jose Hines is a 73 y.o.  male with BPH, depression, gout, hypertension, GERD, and hyperlipidemia presenting for evaluation of bilateral leg weakness and numbness of the toes.   He has low back pain on the left side several years ago.  He saw a chiropractor for several sessions which alleviated his low back pain for a few weeks and then it returned.  In addition, he developed bilateral leg aching and throbbing pain.  Pain is intermittent and can be at rest or with activity.  He has noticed difficulty climbing stairs and getting up out of a low chair.  He denies any radicular pain or tingling of the legs.  For several months, he has noticed numbness of both feet, especially at the balls and toes.  Numbness is constant.  He does not have any burning pain or tingling in the feet.  He has noticed greater problems with balance, especially when his eyes are closed in the shower and uses a handrail for support.  He has not suffered any falls.  He walks unassisted.  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  No history of diabetes.   Past Medical History:  Diagnosis Date  . 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 Depression   . Diverticulosis   . GERD with stricture   . Hiatal hernia   . Hypertension   . Kidney stones    2 times  . Sleep apnea    wears CPAP  . Wears hearing aid    Bil    Past Surgical History:  Procedure Laterality Date  . COLONOSCOPY    . INGUINAL HERNIA REPAIR    . POLYPECTOMY    . TONSILLECTOMY  AND ADENOIDECTOMY       Medications:  Outpatient Encounter Medications as of 07/11/2018  Medication Sig  . albuterol (PROVENTIL HFA;VENTOLIN HFA) 108 (90 Base) MCG/ACT inhaler Inhale 1-2 puffs into the lungs every 6 (six) hours as needed for wheezing or shortness of breath.  . allopurinol (ZYLOPRIM) 300 MG tablet Take 300 mg by mouth daily.  Marland Kitchen aspirin 81 MG tablet Take 81 mg by mouth daily.  Marland Kitchen esomeprazole (NEXIUM) 40 MG capsule TAKE ONE CAPSULE BY MOUTH DAILY IN THE MORNING (Patient taking differently: Take 40 mg by mouth daily. )  . fluticasone (FLONASE) 50 MCG/ACT nasal spray Place 1 spray into both nostrils daily. (Patient taking differently: Place 1 spray into both nostrils daily as needed for allergies. )  . Fluticasone Furoate (ARNUITY ELLIPTA) 100 MCG/ACT AEPB Inhale 1 puff into the lungs daily.  Marland Kitchen glucosamine-chondroitin 500-400 MG tablet Take 1 tablet by mouth daily.  Marland Kitchen lisinopril-hydrochlorothiazide (PRINZIDE,ZESTORETIC) 10-12.5 MG per tablet Take 1 tablet by mouth daily.  Marland Kitchen loratadine (CLARITIN) 10 MG tablet Take 1 tablet (10 mg total) by mouth daily. (Patient taking differently: Take 10 mg by mouth daily as needed for allergies. )  . lovastatin (MEVACOR) 40 MG tablet Take 40 mg by mouth daily.   . Multiple Vitamins-Iron (MULTIVITAMINS WITH IRON)  TABS Take 1 tablet by mouth daily.  . tamsulosin (FLOMAX) 0.4 MG CAPS capsule Take 1 capsule by mouth daily.  . [DISCONTINUED] predniSONE (DELTASONE) 50 MG tablet Take 1 tablet (50 mg total) by mouth daily.   No facility-administered encounter medications on file as of 07/11/2018.     Allergies: No Known Allergies  Family History: Family History  Problem Relation Age of Onset  . Heart disease Father   . Transient ischemic attack Mother   . Colon cancer Neg Hx   . Esophageal cancer Neg Hx   . Rectal cancer Neg Hx   . Stomach cancer Neg Hx     Social History: Social History   Tobacco Use  . Smoking status: Former Smoker     Packs/day: 2.00    Years: 10.00    Pack years: 20.00    Types: Cigarettes    Quit date: 12/28/1968    Years since quitting: 49.5  . Smokeless tobacco: Never Used  Substance Use Topics  . Alcohol use: Yes    Alcohol/week: 14.0 standard drinks    Types: 14 Standard drinks or equivalent per week    Comment: bourbon  . Drug use: No   Social History   Social History Narrative  . Not on file    Review of Systems:  CONSTITUTIONAL: No fevers, chills, night sweats, or weight loss.   EYES: No visual changes or eye pain ENT: No hearing changes.  No history of nose bleeds.   RESPIRATORY: No cough, wheezing and shortness of breath.   CARDIOVASCULAR: Negative for chest pain, and palpitations.   GI: Negative for abdominal discomfort, blood in stools or black stools.  No recent change in bowel habits.   GU:  No history of incontinence.   MUSCLOSKELETAL: +history of joint pain or swelling.  No myalgias.   SKIN: Negative for lesions, rash, and itching.   HEMATOLOGY/ONCOLOGY: Negative for prolonged bleeding, bruising easily, and swollen nodes.  No history of cancer.   ENDOCRINE: Negative for cold or heat intolerance, polydipsia or goiter.   PSYCH:  No depression or anxiety symptoms.   NEURO: As Above.   Vital Signs:  BP 132/60   Pulse 97   Ht 5\' 11"  (1.803 m)   Wt 210 lb (95.3 kg)   SpO2 97%   BMI 29.29 kg/m    General Medical Exam:   General:  Well appearing, comfortable.   Eyes/ENT: see cranial nerve examination.   Neck:   No carotid bruits. Respiratory: Inspiratory wheezing on the left, clear to auscultation on the right lung base, good air entry bilaterally.   Cardiac:  Regular rate and rhythm, no murmur.   Extremities:  No deformities, edema, or skin discoloration.  Skin:  No rashes or lesions.  Neurological Exam: MENTAL STATUS including orientation to time, place, person, recent and remote memory, attention span and concentration, language, and fund of knowledge is normal.   Speech is not dysarthric.  CRANIAL NERVES: II:  No visual field defects.   III-IV-VI: Pupils equal round and reactive to light.  Normal conjugate, extra-ocular eye movements in all directions of gaze.  No nystagmus.  No ptosis.   V:  Normal facial sensation.    VII:  Normal facial symmetry and movements.   VIII:  Normal hearing and vestibular function.   IX-X:  Normal palatal movement.   XI:  Normal shoulder shrug and head rotation.   XII:  Normal tongue strength and range of motion, no deviation or fasciculation.  MOTOR:  No atrophy, fasciculations or abnormal movements.  No pronator drift.   Upper Extremity:  Right  Left  Deltoid  5/5   5/5   Biceps  5/5   5/5   Triceps  5/5   5/5   Infraspinatus 5/5  5/5  Medial pectoralis 5/5  5/5  Wrist extensors  5/5   5/5   Wrist flexors  5/5   5/5   Finger extensors  5/5   5/5   Finger flexors  5/5   5/5   Dorsal interossei  5/5   5/5   Abductor pollicis  5/5   5/5   Tone (Ashworth scale)  0  0   Lower Extremity:  Right  Left  Hip flexors  5/5   5/5   Hip extensors  5/5   5/5   Adductor 5/5  5/5  Abductor 5/5  5/5  Knee flexors  5/5   5/5   Knee extensors  5/5   5/5   Dorsiflexors  5/5   5/5   Plantarflexors  5/5   5/5   Toe extensors  5/5   5/5   Toe flexors  5/5   5/5   Tone (Ashworth scale)  0  0   MSRs:  Right        Left                  brachioradialis 2+  2+  biceps 2+  2+  triceps 2+  2+  patellar 2+  2+  ankle jerk 0  0  Hoffman no  no  plantar response down  down   SENSORY:  There is a gradient pattern of temperature and pinprick loss below the ankles.  Vibration is reduced at the ankles and absent at the great toe.  Romberg sign is positive.    COORDINATION/GAIT: Normal finger-to- nose-finger and heel-to-shin.  Intact rapid alternating movements bilaterally.  Unable to rise from a chair without using arms.  Gait mildly wide-based and stable, unassisted.  He is unsteady with performing tandem gait.  Stress gait  is intact.    IMPRESSION: 1. 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   - Start physical therapy for back and leg strengthening  - If no improvement, MRI lumbar spine will be the next step  2.  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   I had extensive discussion with the patient regarding the pathogenesis, etiology, management, and natural course of neuropathy. Neuropathy tends to be slowly progressive and management is symptomatic.    - Check  TSH, vitamin B12, vitamin B1, folate, SPEP with IFE, copper  - NCS/EMG of the legs  - Recommend cutting back on alcohol intake  - Fall precautions and daily foot inspection discussed  Return to clinic in 2-3 months.    Thank you for allowing me to participate in patient's care.  If I can answer any additional questions, I would be pleased to do so.    Sincerely,    Kyndall Amero K. Posey Pronto, DO

## 2018-07-11 ENCOUNTER — Encounter: Payer: Self-pay | Admitting: Neurology

## 2018-07-11 ENCOUNTER — Ambulatory Visit (INDEPENDENT_AMBULATORY_CARE_PROVIDER_SITE_OTHER): Payer: BC Managed Care – PPO | Admitting: Neurology

## 2018-07-11 ENCOUNTER — Other Ambulatory Visit: Payer: Self-pay

## 2018-07-11 ENCOUNTER — Other Ambulatory Visit (INDEPENDENT_AMBULATORY_CARE_PROVIDER_SITE_OTHER): Payer: BC Managed Care – PPO

## 2018-07-11 VITALS — BP 132/60 | HR 97 | Ht 71.0 in | Wt 210.0 lb

## 2018-07-11 DIAGNOSIS — M5417 Radiculopathy, lumbosacral region: Secondary | ICD-10-CM

## 2018-07-11 DIAGNOSIS — G621 Alcoholic polyneuropathy: Secondary | ICD-10-CM | POA: Diagnosis not present

## 2018-07-11 LAB — VITAMIN B12: Vitamin B-12: 542 pg/mL (ref 211–911)

## 2018-07-11 NOTE — Patient Instructions (Addendum)
Start physical therapy  Check labs Your provider has requested that you have labwork completed today. Please go to [facility] ([id number]) on the second floor of this building before leaving the office today. You do not need to check in. If you are not called within 15 minutes please check with the front desk.    Start checking your feet daily  Take extra caution when you are walking on uneven ground  Try to cut back on your alcohol intake  Return to clinic in 2-3 months

## 2018-07-12 LAB — FOLATE: Folate: 20.9 ng/mL

## 2018-07-15 LAB — TEST AUTHORIZATION

## 2018-07-15 LAB — EXTRA SPECIMEN

## 2018-07-15 LAB — PROTEIN ELECTROPHORESIS, SERUM

## 2018-07-15 LAB — COPPER, SERUM

## 2018-07-19 LAB — PROTEIN ELECTROPHORESIS, SERUM
Albumin ELP: 3.7 g/dL — ABNORMAL LOW (ref 3.8–4.8)
Alpha 1: 0.3 g/dL (ref 0.2–0.3)
Alpha 2: 0.9 g/dL (ref 0.5–0.9)
Beta 2: 0.3 g/dL (ref 0.2–0.5)
Beta Globulin: 0.5 g/dL (ref 0.4–0.6)
Gamma Globulin: 0.8 g/dL (ref 0.8–1.7)
Total Protein: 6.6 g/dL (ref 6.1–8.1)

## 2018-07-19 LAB — TEST AUTHORIZATION

## 2018-07-19 LAB — IMMUNOFIXATION ELECTROPHORESIS
IgG (Immunoglobin G), Serum: 880 mg/dL (ref 600–1540)
IgM, Serum: 54 mg/dL (ref 50–300)
Immunofix Electr Int: NOT DETECTED
Immunoglobulin A: 308 mg/dL (ref 70–320)

## 2018-07-19 LAB — TSH: TSH: 1.49 mIU/L (ref 0.40–4.50)

## 2018-08-02 ENCOUNTER — Encounter: Payer: Self-pay | Admitting: Pulmonary Disease

## 2018-08-02 ENCOUNTER — Other Ambulatory Visit: Payer: Self-pay

## 2018-08-02 ENCOUNTER — Ambulatory Visit: Payer: BC Managed Care – PPO | Admitting: Pulmonary Disease

## 2018-08-02 VITALS — BP 142/68 | HR 74 | Temp 97.9°F | Ht 71.0 in | Wt 209.2 lb

## 2018-08-02 DIAGNOSIS — R059 Cough, unspecified: Secondary | ICD-10-CM

## 2018-08-02 DIAGNOSIS — R05 Cough: Secondary | ICD-10-CM

## 2018-08-02 MED ORDER — AMOXICILLIN-POT CLAVULANATE 875-125 MG PO TABS: 1.0000 | ORAL_TABLET | Freq: Two times a day (BID) | ORAL | 0 refills | Status: DC

## 2018-08-02 MED ORDER — ALBUTEROL SULFATE (2.5 MG/3ML) 0.083% IN NEBU: 2.5000 mg | INHALATION_SOLUTION | Freq: Four times a day (QID) | RESPIRATORY_TRACT | 12 refills | Status: DC | PRN

## 2018-08-02 MED ORDER — PREDNISONE 10 MG PO TABS: 10.0000 mg | ORAL_TABLET | Freq: Every day | ORAL | 0 refills | Status: DC

## 2018-08-02 NOTE — Progress Notes (Signed)
Subjective:    Patient ID: Jose Hines, male    DOB: 07-Dec-1945, 73 y.o.   MRN: [id number]  History of recurrent bronchitis  Shortness of breath is worse in the last few months Has had courses of antibiotics and steroids recently  Shortness of breath with protracted cough leading to syncopal episodes  Coughs and brings up clear phlegm He does have nasal stuffiness and congestion  Reformed smoker quit in the 70s  Denies any significant chest pains or chest discomfort Prior to recent exacerbations-was not limited with activities  Denies any fevers or chills No contact with anyone with a febrile illness     Review of Systems  Constitutional: Negative for fever and unexpected weight change.  HENT: Negative for congestion, dental problem, ear pain, nosebleeds, postnasal drip, rhinorrhea, sinus pressure, sneezing, sore throat and trouble swallowing.   Eyes: Negative for redness and itching.  Respiratory: Positive for cough, shortness of breath and wheezing. Negative for chest tightness.   Cardiovascular: Negative for palpitations and leg swelling.  Gastrointestinal: Negative for nausea and vomiting.  Genitourinary: Negative for dysuria.  Musculoskeletal: Negative for joint swelling.  Skin: Negative for rash.  Allergic/Immunologic: Positive for environmental allergies. Negative for food allergies and immunocompromised state.  Neurological: Negative for headaches.  Hematological: Does not bruise/bleed easily.  Psychiatric/Behavioral: Negative for dysphoric mood. The patient is not nervous/anxious.    Past Medical History:  Diagnosis Date  . 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 Depression   . Diverticulosis   . GERD with stricture   . Hiatal hernia   . Hypertension   . Kidney stones    2 times  . Sleep apnea    wears CPAP  . Wears hearing aid    Bil   Family History  Problem Relation Age of Onset  . Heart disease Father   . Transient ischemic attack Mother   . Colon  cancer Neg Hx   . Esophageal cancer Neg Hx   . Rectal cancer Neg Hx   . Stomach cancer Neg Hx    Reformed smoker Pertinent occupational history    Objective:   Physical Exam Constitutional:      Appearance: Normal appearance.  Neck:     Musculoskeletal: Normal range of motion and neck supple. No neck rigidity or muscular tenderness.  Cardiovascular:     Rate and Rhythm: Normal rate and regular rhythm.     Heart sounds: No murmur.  Pulmonary:     Effort: Pulmonary effort is normal. No respiratory distress.     Breath sounds: Normal breath sounds. No stridor. No wheezing or rhonchi.  Musculoskeletal: Normal range of motion.        General: No swelling, tenderness or deformity.  Skin:    General: Skin is warm and dry.     Coloration: Skin is not jaundiced.  Neurological:     General: No focal deficit present.     Mental Status: He is alert.  Psychiatric:        Mood and Affect: Mood normal.        Behavior: Behavior normal.    Vitals:   08/02/18 1523  BP: (!) 142/68  Pulse: 74  Temp: 97.9 F (36.6 C)  SpO2: 96%    Recent chest x-ray reviewed by myself showing no acute infiltrate Previous spirometry from 2019 revealed mild obstructive disease     Assessment & Plan:  .  Asthma with exacerbation  .  Recurrent bronchitis  Plan: .  We will set  the patient up with a nebulizer .  To use albuterol via nebulizer up to 4 times a day as needed .  We will give him a course of steroids prednisone 10 p.o. twice daily for 10 days. . We will give him a course of antibiotics Augmentin 1 p.o. twice daily for 7 days  I will see him back in the office in about 3 weeks  .  We will plan to repeat his pulmonary function study-I feel PFT when he is more stable

## 2018-08-02 NOTE — Patient Instructions (Signed)
Exacerbation of asthma Recurrent symptoms, protracted symptoms  We will set you up with a nebulizer To use albuterol up to 4 times a day as needed  Course of antibiotics Course of steroids  I will see you back in about 3 weeks Call with significant symptoms  We will recheck your breathing study once you are more stable

## 2018-08-15 ENCOUNTER — Encounter: Payer: Self-pay | Admitting: Gastroenterology

## 2018-08-23 ENCOUNTER — Other Ambulatory Visit: Payer: Self-pay

## 2018-08-23 ENCOUNTER — Encounter: Payer: Self-pay | Admitting: Pulmonary Disease

## 2018-08-23 ENCOUNTER — Ambulatory Visit: Payer: BC Managed Care – PPO | Admitting: Pulmonary Disease

## 2018-08-23 VITALS — BP 110/84 | HR 71 | Ht 71.0 in | Wt 211.0 lb

## 2018-08-23 DIAGNOSIS — R05 Cough: Secondary | ICD-10-CM | POA: Diagnosis not present

## 2018-08-23 DIAGNOSIS — J4540 Moderate persistent asthma, uncomplicated: Secondary | ICD-10-CM

## 2018-08-23 DIAGNOSIS — R053 Chronic cough: Secondary | ICD-10-CM

## 2018-08-23 NOTE — Progress Notes (Signed)
   Subjective:    Patient ID: Jose Hines, male    DOB: [birth year], 73 y.o.   MRN: [id number]  Patient being seen for follow-up of COPD   He has been stable since last visit Has been using a nebulizer, frequency of use as improved significantly Only needs it every once in a while  He has a chronic cough, no significant expectoration Has not felt sick recently No recent ED visits/urgent care visits No recent antibiotic use or steroid use  Reformed smoker quit in the 70s  Denies any significant chest pains or chest discomfort Prior to recent exacerbations-was not limited with activities  Denies any fevers or chills No contact with anyone with a febrile illness   Review of Systems  Constitutional: Negative for fever and unexpected weight change.  HENT: Negative for congestion, dental problem, ear pain, nosebleeds, postnasal drip, rhinorrhea, sinus pressure, sneezing, sore throat and trouble swallowing.   Eyes: Negative for redness and itching.  Respiratory: Positive for cough. Negative for chest tightness, shortness of breath and wheezing.   Cardiovascular: Negative for palpitations and leg swelling.  Gastrointestinal: Negative for nausea and vomiting.  Genitourinary: Negative for dysuria.  Skin: Negative for rash.  Allergic/Immunologic: Negative for food allergies and immunocompromised state.   Past Medical History:  Diagnosis Date  . 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 Depression   . Diverticulosis   . GERD with stricture   . Hiatal hernia   . Hypertension   . Kidney stones    2 times  . Sleep apnea    wears CPAP  . Wears hearing aid    Bil   Family History  Problem Relation Age of Onset  . Heart disease Father   . Transient ischemic attack Mother   . Colon cancer Neg Hx   . Esophageal cancer Neg Hx   . Rectal cancer Neg Hx   . Stomach cancer Neg Hx    Reformed smoker Pertinent occupational history    Objective:   Physical Exam Constitutional:    Appearance: Normal appearance.  Eyes:     Pupils: Pupils are equal, round, and reactive to light.  Neck:     Musculoskeletal: Normal range of motion and neck supple. No neck rigidity or muscular tenderness.  Cardiovascular:     Rate and Rhythm: Normal rate and regular rhythm.     Heart sounds: No murmur.  Pulmonary:     Effort: Pulmonary effort is normal. No respiratory distress.     Breath sounds: Normal breath sounds. No stridor. No wheezing or rhonchi.  Neurological:     Mental Status: He is alert.    Vitals:   08/23/18 0939  BP: 110/84  Pulse: 71  SpO2: 96%    Recent chest x-ray reviewed by myself showing no acute infiltrate Previous spirometry from 2019 revealed mild obstructive disease     Assessment & Plan:  .  History of asthma, obstructive lung disease -Has remained stable -Rare use of nebulization treatments recently -Activity level remains very good -No recent bronchitis  Plan: -Continue nebulizer use as needed -Encouraged to stay very active -Activity as tolerated -Call with significant concerns   I will see him back in the office in about 6 months

## 2018-08-23 NOTE — Patient Instructions (Signed)
COPD-stable  Continue nebulizer use as needed  I will see you back in the office in about 6 months Call with significant concerns Call if you are running out of the medications

## 2018-09-05 ENCOUNTER — Other Ambulatory Visit: Payer: Self-pay | Admitting: Pulmonary Disease

## 2018-09-05 ENCOUNTER — Telehealth: Payer: Self-pay | Admitting: Pulmonary Disease

## 2018-09-05 NOTE — Telephone Encounter (Signed)
Spoke with pt, he states he has already taken care of this and his medications were called in. Nothing further is needed.

## 2018-09-05 NOTE — Telephone Encounter (Signed)
Appropriate for refill

## 2018-09-09 ENCOUNTER — Ambulatory Visit: Payer: BC Managed Care – PPO | Admitting: Neurology

## 2018-09-21 ENCOUNTER — Telehealth: Payer: Self-pay | Admitting: Pulmonary Disease

## 2018-09-21 NOTE — Telephone Encounter (Signed)
Message routed to Dr. Ander Slade based on appointment the patient has been scheduled for on 09/22/18.  Dr. Ander Slade, based on the information below from the patient, and his last office visit with you in July for a chronic cough, can the patient keep the appointment tomororow (09/22/18 at 2:30)?

## 2018-09-21 NOTE — Telephone Encounter (Signed)
Yes, it is okay for him to keep the appointment

## 2018-09-21 NOTE — Telephone Encounter (Signed)
Called the patient back and made him aware of Dr. Judson Roch response and to make sure to wear a mask tomorrow. Advised his temp will be taken and screen questions asked at the door. Patient voiced understanding. Nothing further needed at this time.

## 2018-09-22 ENCOUNTER — Telehealth: Payer: Self-pay | Admitting: Pulmonary Disease

## 2018-09-22 ENCOUNTER — Encounter: Payer: Self-pay | Admitting: Pulmonary Disease

## 2018-09-22 ENCOUNTER — Ambulatory Visit: Payer: BC Managed Care – PPO | Admitting: Pulmonary Disease

## 2018-09-22 ENCOUNTER — Other Ambulatory Visit: Payer: Self-pay

## 2018-09-22 VITALS — BP 110/72 | HR 92 | Temp 97.2°F | Ht 71.0 in | Wt 214.0 lb

## 2018-09-22 DIAGNOSIS — J4540 Moderate persistent asthma, uncomplicated: Secondary | ICD-10-CM

## 2018-09-22 DIAGNOSIS — R05 Cough: Secondary | ICD-10-CM | POA: Diagnosis not present

## 2018-09-22 DIAGNOSIS — R059 Cough, unspecified: Secondary | ICD-10-CM

## 2018-09-22 MED ORDER — BENZONATATE 200 MG PO CAPS: 200.0000 mg | ORAL_CAPSULE | Freq: Three times a day (TID) | ORAL | 1 refills | Status: DC | PRN

## 2018-09-22 MED ORDER — PREDNISONE 10 MG PO TABS: ORAL_TABLET | ORAL | 0 refills | Status: DC

## 2018-09-22 MED ORDER — BUDESONIDE 0.5 MG/2ML IN SUSP: 0.5000 mg | Freq: Two times a day (BID) | RESPIRATORY_TRACT | 0 refills | Status: DC

## 2018-09-22 MED ORDER — CLARITHROMYCIN 500 MG PO TABS: 500.0000 mg | ORAL_TABLET | Freq: Two times a day (BID) | ORAL | 0 refills | Status: DC

## 2018-09-22 NOTE — Patient Instructions (Signed)
Protracted cough Shortness of breath with activity  We will give you a course of antibiotics-Biaxin Course of steroids-start oral steroids only if not feeling better with other treatments Pulmicort to go in your nebulizer-to be used twice a day regardless of how you are feeling, may reduce dose as you feel better Increase benzonatate to 200, 3 times daily-May reduce dose as you feel better  I will see you back in the office in about 4 to 6 weeks

## 2018-09-22 NOTE — Progress Notes (Signed)
Subjective:    Patient ID: Jose Hines, male    DOB: 1945-09-28, 73 y.o.   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  HENT: Negative for congestion, dental problem, ear pain, nosebleeds, postnasal drip, rhinorrhea, sinus pressure, sneezing, sore throat and trouble swallowing.   Eyes: Negative for redness and itching.  Respiratory: Positive for cough, shortness of breath and wheezing. Negative for chest tightness.   Cardiovascular: Negative for palpitations and leg swelling.  Gastrointestinal: Negative for nausea and vomiting.  Genitourinary: Negative for dysuria.  Musculoskeletal: Negative for joint swelling.  Skin: Negative for rash.  Allergic/Immunologic: Positive for environmental allergies. Negative for food allergies and immunocompromised state.  Neurological: Negative for headaches.  Hematological: Does not bruise/bleed easily.  Psychiatric/Behavioral: Negative for dysphoric mood. The patient is not nervous/anxious.    Past Medical History:  Diagnosis Date  . 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 Depression   . Diverticulosis   . GERD with stricture   . Hiatal hernia   . Hypertension   . Kidney stones    2  times  . Sleep apnea    wears CPAP  . Wears hearing aid    Bil   Family History  Problem Relation Age of Onset  . Heart disease Father   . Transient ischemic attack Mother   . Colon cancer Neg Hx   . Esophageal cancer Neg Hx   . Rectal cancer Neg Hx   . Stomach cancer Neg Hx    Reformed smoker Pertinent occupational history    Objective:   Physical Exam Constitutional:      Appearance: Normal appearance.  HENT:     Head: Normocephalic and atraumatic.     Nose: Nose normal.     Mouth/Throat:     Mouth: Mucous membranes are moist.  Eyes:     General:        Right eye: No discharge.     Pupils: Pupils are equal, round, and reactive to light.  Neck:     Musculoskeletal: Normal range of motion and neck supple. No neck rigidity or muscular tenderness.  Cardiovascular:     Rate and Rhythm: Normal rate and regular rhythm.     Pulses: Normal pulses.     Heart sounds: Normal heart sounds. No murmur.  Pulmonary:     Effort: Pulmonary effort is normal. No respiratory distress.     Breath sounds: No stridor. Rhonchi present. No wheezing.  Musculoskeletal: Normal range of motion.        General: No swelling, tenderness or deformity.  Skin:    General: Skin is warm and dry.  Neurological:     Mental Status: He is alert.    Vitals:   09/22/18 1427  BP: 110/72  Pulse: 92  Temp: (!) 97.2 F (36.2 C)  SpO2: 95%    Recent chest x-ray reviewed by myself showing no acute infiltrate Previous spirometry from 2019 revealed mild obstructive disease      Assessment & Plan:  .  Asthma with exacerbation  .  Recurrent bronchitis  .  Protracted cough with almost syncopal episodes  Plan: .  We will continue with nebulization use -Add Pulmicort 0.5 twice daily  .  He has recently completed a course of Augmentin, continues to have persistent symptoms I will give him a course of Biaxin  -Continue Tessalon Perles -Increased to 200 p.o. 3 times daily  I will see him back in  the office in about 4 weeks  .  We will plan to repeat his pulmonary function study-I feel PFT when he is more stable

## 2018-09-22 NOTE — Telephone Encounter (Signed)
Spoke with pharmacist. She wanted to know if it would be safe for the patient take his glucoasamine combined with the Biaxin, Flomax and lovastatin. She stated that the combination of these medications will increase the levels of lovastatin in his system, which may possibly cause increased body aches and low blood pressure. She wanted to know if AO was aware of this.   I advised her that AO was gone for the day but I would send a message to him. Also advised her to hold the medications until we can call back tomorrow, she verbalized understanding.   AO, please advise. Thanks!

## 2018-09-23 ENCOUNTER — Other Ambulatory Visit: Payer: Self-pay | Admitting: Pulmonary Disease

## 2018-09-23 MED ORDER — CEFDINIR 300 MG PO CAPS: 300.0000 mg | ORAL_CAPSULE | Freq: Two times a day (BID) | ORAL | 0 refills | Status: DC

## 2018-09-23 NOTE — Telephone Encounter (Signed)
I did call the pharmacy  Discontinue Biaxin from his med profile (I did try to do this myself but was unsuccessful)  Prescription for Omnicef 300 p.o. twice daily for 7 days will be placed

## 2018-09-23 NOTE — Telephone Encounter (Signed)
Called the pharmacy and confirmed the prescription for Omnicef was received. The patient's wife is aware of the prescription and it is ready for pick up. Advised them that Dr. Ander Slade wanted to discontinue Biaxin from the patient profile and if that could be noted on their records.  It does show as deleted (on inactive medication history list). Nothing further needed at this time.

## 2018-10-22 ENCOUNTER — Other Ambulatory Visit: Payer: Self-pay | Admitting: Pulmonary Disease

## 2018-10-24 ENCOUNTER — Other Ambulatory Visit: Payer: Self-pay

## 2018-10-24 ENCOUNTER — Encounter: Payer: Self-pay | Admitting: Pulmonary Disease

## 2018-10-24 ENCOUNTER — Ambulatory Visit: Payer: BC Managed Care – PPO | Admitting: Pulmonary Disease

## 2018-10-24 VITALS — BP 122/70 | HR 60 | Temp 98.1°F | Ht 71.0 in | Wt 217.8 lb

## 2018-10-24 DIAGNOSIS — J45909 Unspecified asthma, uncomplicated: Secondary | ICD-10-CM | POA: Diagnosis not present

## 2018-10-24 DIAGNOSIS — R0602 Shortness of breath: Secondary | ICD-10-CM | POA: Diagnosis not present

## 2018-10-24 NOTE — Patient Instructions (Signed)
Complete your course of prednisone  Continue using nebulization treatments as discussed in the office today  Call us with any concerns  I will see you back in the office in about 3 months

## 2018-10-24 NOTE — Progress Notes (Signed)
Subjective:    Patient ID: Jose Hines, male    DOB: 03/20/1945, 73 y.o.   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  HENT: Negative for congestion, dental problem, ear pain, nosebleeds, postnasal drip, rhinorrhea, sinus pressure, sneezing, sore throat and trouble swallowing.   Eyes: Negative for redness and itching.  Respiratory: Negative for cough, chest tightness, shortness of breath and wheezing.   Cardiovascular: Negative for palpitations and leg swelling.  Gastrointestinal: Negative for nausea and vomiting.  Genitourinary: Negative for dysuria.  Musculoskeletal: Negative for joint swelling.  Skin: Negative for rash.  Allergic/Immunologic: Positive for environmental allergies. Negative for food allergies and immunocompromised state.  Neurological: Negative for headaches.  Hematological: Does not bruise/bleed easily.  Psychiatric/Behavioral: Negative for dysphoric mood. The patient is not nervous/anxious.    Past Medical History:  Diagnosis Date  . 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 Depression   . Diverticulosis   . GERD with stricture   . Hiatal hernia   . Hypertension   . Kidney stones    2 times  . Sleep apnea    wears CPAP  . Wears hearing aid    Bil   Family History  Problem Relation Age of Onset  .  Heart disease Father   . Transient ischemic attack Mother   . Colon cancer Neg Hx   . Esophageal cancer Neg Hx   . Rectal cancer Neg Hx   . Stomach cancer Neg Hx    Reformed smoker Pertinent occupational history    Objective:   Physical Exam Constitutional:      Appearance: Normal appearance.  HENT:     Head: Normocephalic and atraumatic.     Nose: Nose normal.     Mouth/Throat:     Mouth: Mucous membranes are moist.  Eyes:     General:        Right eye: No discharge.     Pupils: Pupils are equal, round, and reactive to light.  Neck:     Musculoskeletal: Normal range of motion and neck supple. No neck rigidity or muscular tenderness.  Cardiovascular:     Rate and Rhythm: Normal rate and regular rhythm.     Pulses: Normal pulses.     Heart sounds: Normal heart sounds. No murmur.  Pulmonary:     Effort: Pulmonary effort is normal. No respiratory distress.     Breath sounds: No stridor. No wheezing or rhonchi.  Abdominal:     Palpations: Abdomen is soft.  Musculoskeletal: Normal range of motion.        General: No swelling, tenderness or deformity.  Skin:    General: Skin is warm and dry.  Neurological:     Mental Status: He is alert.    Vitals:   10/24/18 1556  BP: 122/70  Pulse: 60  Temp: 98.1  F (36.7 C)  SpO2: 94%    Recent chest x-ray reviewed by myself showing no acute infiltrate Previous spirometry from 2019 revealed mild obstructive disease      Assessment & Plan:  .  Asthma with exacerbation Symptoms appear to be resolving at present  .  Recurrent bronchitis Symptoms improving recent treatment   Plan:   .  We will continue with nebulization use -Add Pulmicort 0.5 twice daily  .  Continue nebulizer use twice a day with Pulmicort and albuterol  -Continue Tessalon Perles -Increased to 200 p.o. 3 times daily  I will see him back in the office in about 3 months  .  Continue lines of care .  Call with significant concerns

## 2018-10-25 ENCOUNTER — Telehealth: Payer: Self-pay | Admitting: Pulmonary Disease

## 2018-10-25 NOTE — Telephone Encounter (Signed)
Called spoke with patient who reports he is with [employer] and with the return to school at some point in the future, staff has been asked to submit documentation of medical conditions.  Patient is requesting such a letter be mailed to him stating that he is being treated for "acute bronchitis."  Patient would like the following information included in the letter but would like the letter mailed to him (home address verified)  [employer] [address] [place] Att[employer] Dianne Dun   Dr Ander Slade please advise if you okay with providing letter and any additional diagnoses you would like added.  Thank you.

## 2018-11-04 NOTE — Telephone Encounter (Signed)
Letter printed and placed up front for pick up. Nothing further is needed.

## 2018-11-04 NOTE — Telephone Encounter (Signed)
Sure

## 2018-11-04 NOTE — Telephone Encounter (Signed)
Patient checking the status of the letter.  Patient phone number is [phone].

## 2018-11-04 NOTE — Telephone Encounter (Signed)
Spoke with pt and advised him that we were waiting on AO to respond to his message. Pt states he has run out of time and has been waiting for the letter for over a week. Beth can you sign off on letter so pt can come pick it up today? Please advise.

## 2019-01-02 ENCOUNTER — Other Ambulatory Visit: Payer: Self-pay | Admitting: Pulmonary Disease

## 2019-02-12 ENCOUNTER — Ambulatory Visit: Payer: BC Managed Care – PPO

## 2019-03-01 ENCOUNTER — Ambulatory Visit: Payer: BC Managed Care – PPO

## 2019-08-08 ENCOUNTER — Ambulatory Visit: Payer: BC Managed Care – PPO | Admitting: Gastroenterology

## 2019-10-13 ENCOUNTER — Encounter: Payer: Self-pay | Admitting: Gastroenterology

## 2019-12-12 ENCOUNTER — Ambulatory Visit (AMBULATORY_SURGERY_CENTER): Payer: Self-pay | Admitting: *Deleted

## 2019-12-12 ENCOUNTER — Other Ambulatory Visit: Payer: Self-pay

## 2019-12-12 VITALS — Ht 71.0 in | Wt 198.6 lb

## 2019-12-12 DIAGNOSIS — Z86010 Personal history of colonic polyps: Secondary | ICD-10-CM

## 2019-12-12 MED ORDER — GOLYTELY 236 G PO SOLR: 4000.0000 mL | Freq: Once | ORAL | 0 refills | Status: AC

## 2019-12-12 NOTE — Progress Notes (Signed)
Patient denies any allergies to egg or soy products. Patient denies complications with anesthesia/sedation.  Patient denies oxygen use at home and denies diet medications.  Patient fully vaccinated with [place], booster injection on 11/05/19. Patient denies information on colonoscopy procedure.

## 2019-12-26 ENCOUNTER — Other Ambulatory Visit: Payer: Self-pay

## 2019-12-26 ENCOUNTER — Encounter: Payer: Self-pay | Admitting: Gastroenterology

## 2019-12-26 ENCOUNTER — Ambulatory Visit (AMBULATORY_SURGERY_CENTER): Payer: BC Managed Care – PPO | Admitting: Gastroenterology

## 2019-12-26 VITALS — BP 135/76 | HR 79 | Temp 96.9°F | Resp 16 | Ht 71.0 in | Wt 198.6 lb

## 2019-12-26 DIAGNOSIS — Z86010 Personal history of colonic polyps: Secondary | ICD-10-CM | POA: Diagnosis present

## 2019-12-26 DIAGNOSIS — D123 Benign neoplasm of transverse colon: Secondary | ICD-10-CM

## 2019-12-26 DIAGNOSIS — D124 Benign neoplasm of descending colon: Secondary | ICD-10-CM

## 2019-12-26 MED ORDER — SODIUM CHLORIDE 0.9 % IV SOLN: 500.0000 mL | Freq: Once | INTRAVENOUS | Status: DC

## 2019-12-26 NOTE — Patient Instructions (Signed)
Please read all of the handouts given to you by your recovery room nurse. You will probably need another colonoscopy in 3 years.  YOU HAD AN ENDOSCOPIC PROCEDURE TODAY AT [facility]:   Refer to the procedure report that was given to you for any specific questions about what was found during the examination.  If the procedure report does not answer your questions, please call your gastroenterologist to clarify.  If you requested that your care partner not be given the details of your procedure findings, then the procedure report has been included in a sealed envelope for you to review at your convenience later.  YOU SHOULD EXPECT: Some feelings of bloating in the abdomen. Passage of more gas than usual.  Walking can help get rid of the air that was put into your GI tract during the procedure and reduce the bloating. If you had a lower endoscopy (such as a colonoscopy or flexible sigmoidoscopy) you may notice spotting of blood in your stool or on the toilet paper. If you underwent a bowel prep for your procedure, you may not have a normal bowel movement for a few days.  Please Note:  You might notice some irritation and congestion in your nose or some drainage.  This is from the oxygen used during your procedure.  There is no need for concern and it should clear up in a day or so.  SYMPTOMS TO REPORT IMMEDIATELY:   Following lower endoscopy (colonoscopy or flexible sigmoidoscopy):  Excessive amounts of blood in the stool  Significant tenderness or worsening of abdominal pains  Swelling of the abdomen that is new, acute  Fever of 100F or higher   For urgent or emergent issues, a gastroenterologist can be reached at any hour by calling [phone]. Do not use MyChart messaging for urgent concerns.    DIET:  We do recommend a small meal at first, but then you may proceed to your regular diet.  Drink plenty of fluids but you should avoid alcoholic beverages for 24 hours. Try to  increase the fiber in your diet, and drink plenty of water.  ACTIVITY:  You should plan to take it easy for the rest of today and you should NOT DRIVE or use heavy machinery until tomorrow (because of the sedation medicines used during the test).    FOLLOW UP: Our staff will call the number listed on your records 48-72 hours following your procedure to check on you and address any questions or concerns that you may have regarding the information given to you following your procedure. If we do not reach you, we will leave a message.  We will attempt to reach you two times.  During this call, we will ask if you have developed any symptoms of COVID 19. If you develop any symptoms (ie: fever, flu-like symptoms, shortness of breath, cough etc.) before then, please call [phone].  If you test positive for Covid 19 in the 2 weeks post procedure, please call and report this information to [place].    If any biopsies were taken you will be contacted by phone or by letter within the next 1-3 weeks.  Please call us at [phone] if you have not heard about the biopsies in 3 weeks.    SIGNATURES/CONFIDENTIALITY: You and/or your care partner have signed paperwork which will be entered into your electronic medical record.  These signatures attest to the fact that that the information above on your After Visit Summary has been reviewed  and is understood.  Full responsibility of the confidentiality of this discharge information lies with you and/or your care-partner. 

## 2019-12-26 NOTE — Progress Notes (Signed)
Patient insisted on sitting on the side of the bed to use a urinal. Not dizzy.

## 2019-12-26 NOTE — Op Note (Signed)
[place] Patient Name: Jose Hines Procedure Date: 12/26/2019 8:42 AM MRN: [id number] Endoscopist: Ladene Artist , MD Age: 74 Referring MD:  Date of Birth: 04/25/1945 Gender: Male Account #: [id number] Procedure:                Colonoscopy Indications:              Surveillance: Personal history of adenomatous                            polyps on last colonoscopy > 3 years ago Medicines:                Monitored Anesthesia Care Procedure:                Pre-Anesthesia Assessment:                           - Prior to the procedure, a History and Physical                            was performed, and patient medications and                            allergies were reviewed. The patient's tolerance of                            previous anesthesia was also reviewed. The risks                            and benefits of the procedure and the sedation                            options and risks were discussed with the patient.                            All questions were answered, and informed consent                            was obtained. Prior Anticoagulants: The patient has                            taken no previous anticoagulant or antiplatelet                            agents. ASA Grade Assessment: III - A patient with                            severe systemic disease. After reviewing the risks                            and benefits, the patient was deemed in                            satisfactory condition to undergo the procedure.  After obtaining informed consent, the colonoscope                            was passed under direct vision. Throughout the                            procedure, the patient's blood pressure, pulse, and                            oxygen saturations were monitored continuously. The                            Olympus CF-HQ190L [id number]) Colonoscope was                            introduced through the anus  and advanced to the the                            cecum, identified by appendiceal orifice and                            ileocecal valve. The ileocecal valve, appendiceal                            orifice, and rectum were photographed. The quality                            of the bowel preparation was good. The colonoscopy                            was performed without difficulty. The patient                            tolerated the procedure well. Scope In: 8:57:41 AM Scope Out: 9:15:37 AM Scope Withdrawal Time: 0 hours 15 minutes 54 seconds  Total Procedure Duration: 0 hours 17 minutes 56 seconds  Findings:                 The perianal and digital rectal examinations were                            normal.                           Eight sessile polyps were found in the descending                            colon (1) and transverse colon (7). The polyps were                            6 to 9 mm in size. These polyps were removed with a                            cold snare. Resection and retrieval were complete.  Multiple medium-mouthed diverticula were found in                            the left colon. There was narrowing of the colon in                            association with the diverticular opening. There                            was evidence of an impacted diverticulum. There was                            no evidence of diverticular bleeding.                           The exam was otherwise without abnormality on                            direct and retroflexion views. Complications:            No immediate complications. Estimated blood loss:                            None. Estimated Blood Loss:     Estimated blood loss: none. Impression:               - Eight 6 to 9 mm polyps in the descending colon                            and in the transverse colon, removed with a cold                            snare. Resected and retrieved.                            - Moderate diverticulosis in the left colon.                           - The examination was otherwise normal on direct                            and retroflexion views. Recommendation:           - Repeat colonoscopy, likely 3 years, after studies                            are complete for surveillance based on pathology                            results.                           - Patient has a contact number available for                            emergencies. The signs  and symptoms of potential                            delayed complications were discussed with the                            patient. Return to normal activities tomorrow.                            Written discharge instructions were provided to the                            patient.                           - High fiber diet.                           - Continue present medications.                           - Await pathology results. Ladene Artist, MD 12/26/2019 9:19:47 AM This report has been signed electronically.

## 2019-12-26 NOTE — Progress Notes (Signed)
Called to room to assist during endoscopic procedure.  Patient ID and intended procedure confirmed with present staff. Received instructions for my participation in the procedure from the performing physician.  

## 2019-12-26 NOTE — Progress Notes (Signed)
pt tolerated well. VSS. awake and to recovery. Report given to RN.  

## 2019-12-26 NOTE — Progress Notes (Signed)
Pt's states no medical or surgical changes since previsit or office visit.  VS CW  

## 2019-12-28 ENCOUNTER — Telehealth: Payer: Self-pay

## 2019-12-28 NOTE — Telephone Encounter (Signed)
NO ANSWER, MESSAGE LEFT FOR PATIENT. 

## 2020-01-08 ENCOUNTER — Encounter: Payer: Self-pay | Admitting: Gastroenterology

## 2020-03-28 ENCOUNTER — Emergency Department (HOSPITAL_COMMUNITY)
Admission: EM | Admit: 2020-03-28 | Discharge: 2020-03-28 | Disposition: A | Discharge status: Home / Self Care | Payer: Medicare Other | Attending: Emergency Medicine | Admitting: Emergency Medicine

## 2020-03-28 ENCOUNTER — Emergency Department (HOSPITAL_COMMUNITY): Payer: Medicare Other

## 2020-03-28 ENCOUNTER — Other Ambulatory Visit: Payer: Self-pay

## 2020-03-28 DIAGNOSIS — Z7982 Long term (current) use of aspirin: Secondary | ICD-10-CM | POA: Insufficient documentation

## 2020-03-28 DIAGNOSIS — R112 Nausea with vomiting, unspecified: Secondary | ICD-10-CM

## 2020-03-28 DIAGNOSIS — R42 Dizziness and giddiness: Secondary | ICD-10-CM | POA: Diagnosis present

## 2020-03-28 DIAGNOSIS — R55 Syncope and collapse: Secondary | ICD-10-CM | POA: Diagnosis not present

## 2020-03-28 DIAGNOSIS — Z79899 Other long term (current) drug therapy: Secondary | ICD-10-CM | POA: Diagnosis not present

## 2020-03-28 DIAGNOSIS — Z87891 Personal history of nicotine dependence: Secondary | ICD-10-CM | POA: Insufficient documentation

## 2020-03-28 DIAGNOSIS — R197 Diarrhea, unspecified: Secondary | ICD-10-CM

## 2020-03-28 DIAGNOSIS — I10 Essential (primary) hypertension: Secondary | ICD-10-CM | POA: Insufficient documentation

## 2020-03-28 LAB — CBC WITH DIFFERENTIAL/PLATELET
Abs Immature Granulocytes: 0.03 10*3/uL (ref 0.00–0.07)
Basophils Absolute: 0.1 10*3/uL (ref 0.0–0.1)
Basophils Relative: 1 %
Eosinophils Absolute: 0.2 10*3/uL (ref 0.0–0.5)
Eosinophils Relative: 2 %
HCT: 36.8 % — ABNORMAL LOW (ref 39.0–52.0)
Hemoglobin: 12.5 g/dL — ABNORMAL LOW (ref 13.0–17.0)
Immature Granulocytes: 0 %
Lymphocytes Relative: 13 %
Lymphs Abs: 1.3 10*3/uL (ref 0.7–4.0)
MCH: 34.8 pg — ABNORMAL HIGH (ref 26.0–34.0)
MCHC: 34 g/dL (ref 30.0–36.0)
MCV: 102.5 fL — ABNORMAL HIGH (ref 80.0–100.0)
Monocytes Absolute: 0.8 10*3/uL (ref 0.1–1.0)
Monocytes Relative: 9 %
Neutro Abs: 7.5 10*3/uL (ref 1.7–7.7)
Neutrophils Relative %: 75 %
Platelets: 181 10*3/uL (ref 150–400)
RBC: 3.59 MIL/uL — ABNORMAL LOW (ref 4.22–5.81)
RDW: 12.9 % (ref 11.5–15.5)
WBC: 9.9 10*3/uL (ref 4.0–10.5)
nRBC: 0 % (ref 0.0–0.2)

## 2020-03-28 LAB — COMPREHENSIVE METABOLIC PANEL
ALT: 26 U/L (ref 0–44)
AST: 28 U/L (ref 15–41)
Albumin: 3.6 g/dL (ref 3.5–5.0)
Alkaline Phosphatase: 73 U/L (ref 38–126)
Anion gap: 8 (ref 5–15)
BUN: 10 mg/dL (ref 8–23)
CO2: 24 mmol/L (ref 22–32)
Calcium: 8.4 mg/dL — ABNORMAL LOW (ref 8.9–10.3)
Chloride: 104 mmol/L (ref 98–111)
Creatinine, Ser: 0.6 mg/dL — ABNORMAL LOW (ref 0.61–1.24)
GFR, Estimated: >60 mL/min (ref >60)
Glucose, Bld: 118 mg/dL — ABNORMAL HIGH (ref 70–99)
Potassium: 3.5 mmol/L (ref 3.5–5.1)
Sodium: 136 mmol/L (ref 135–145)
Total Bilirubin: 0.9 mg/dL (ref 0.3–1.2)
Total Protein: 6.4 g/dL — ABNORMAL LOW (ref 6.5–8.1)

## 2020-03-28 LAB — TROPONIN I (HIGH SENSITIVITY): Troponin I (High Sensitivity): 5 ng/L (ref <18)

## 2020-03-28 MED ORDER — ONDANSETRON 4 MG PO TBDP: 4.0000 mg | ORAL_TABLET | Freq: Three times a day (TID) | ORAL | 0 refills | Status: DC | PRN

## 2020-03-28 MED ORDER — ONDANSETRON HCL 4 MG/2ML IJ SOLN
4.0000 mg | Freq: Once | INTRAMUSCULAR | Status: AC
  Administered 2020-03-28: 4 mg via INTRAVENOUS
  Filled 2020-03-28: qty 2

## 2020-03-28 NOTE — ED Notes (Signed)
Pt ambulated to bathroom, standby assistance, steady gait.

## 2020-03-28 NOTE — ED Notes (Signed)
Patient's wife at bedside.

## 2020-03-28 NOTE — Discharge Instructions (Addendum)
The blood work looks normal today.  No signs of stroke.  Most likely her blood pressure dropped with the vomiting and diarrhea.  This may last for a few days so use the nausea medication as needed.  Make sure you stay hydrated and drink plenty of fluids.  If you start having chest pain, shortness of breath any further episodes of passing out or other concerns return to the emergency room.

## 2020-03-28 NOTE — ED Triage Notes (Signed)
EMS reports from home, sudden onset of dizziness, emesis and diarrhea, about an hour ago, also c/o abdominal cramping.  BP 96/64 HR 74 RR 20 Sp02 98 RA Temp 97.4  18ga LFA  4mg  Zofan 539ml NS enroute

## 2020-03-28 NOTE — ED Notes (Addendum)
Discharge paperwork reviewed with pt, all questions addressed prior to discharge.  Pt ambulatory to exit with spouse.

## 2020-03-28 NOTE — ED Notes (Signed)
Pt provided with water, per his request.

## 2020-03-28 NOTE — ED Provider Notes (Signed)
[facility] DEPT Provider Note   CSN: [id number] Arrival date & time: 03/28/20  1431     History Chief Complaint  Patient presents with  . Dizziness  . Diarrhea  . Emesis    Jose Hines is a 75 y.o. male.  Patient is a 75 year old male with a history of GERD, hiatal hernia, hypertension and kidney stones who is presenting today due to sudden onset of feeling dizzy, abdominal cramps, nausea and vomiting and diarrhea.  Symptoms started acutely today.  Patient reports that he woke up this morning and had some grits and then tomato juice later on in the morning.  He started not feeling well around noon and then these episodes occurred around 1 PM.  Currently he reports he still feels dizzy which the closest he can describe is feeling lightheaded like he might pass out.  He denies having vertiginous symptoms like spinning but does feel off balance when he attempts to walk.  He denies any chest pain, abdominal pain, shortness of breath or palpitations.  He denies any urinary issues.  No recent medication changes.  He was having abdominal cramping earlier but that has subsided.  He reports just feeling ill.  No sick contacts.  The history is provided by the patient and the EMS personnel.  Dizziness Quality:  Lightheadedness and imbalance Severity:  Severe Onset quality:  Sudden Duration:  2 hours Timing:  Constant Progression:  Waxing and waning Chronicity:  New Relieved by:  Being still Worsened by:  Standing up Ineffective treatments:  None tried Associated symptoms: diarrhea, nausea and vomiting   Associated symptoms: no chest pain, no headaches, no palpitations, no shortness of breath, no tinnitus, no vision changes and no weakness   Risk factors comment:  Hx of GERD, HTN, kidney stones Diarrhea Associated symptoms: vomiting   Associated symptoms: no headaches   Emesis Associated symptoms: diarrhea   Associated symptoms: no headaches         Past Medical History:  Diagnosis Date  . 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 Allergy   . Depression   . Diverticulosis   . GERD with stricture   . Hiatal hernia   . Hypertension   . Kidney stones    2 times - passed stones, no surgery required  . Sleep apnea    wears CPAP  . Wears hearing aid    Bil    Patient Active Problem List   Diagnosis Date Noted  . GERD 01/09/2009  . PERSONAL HX COLONIC POLYPS 01/09/2009    Past Surgical History:  Procedure Laterality Date  . COLONOSCOPY    . INGUINAL HERNIA REPAIR    . POLYPECTOMY    . TONSILLECTOMY AND ADENOIDECTOMY         Family History  Problem Relation Age of Onset  . Heart disease Father   . Transient ischemic attack Mother   . Colon cancer Neg Hx   . Esophageal cancer Neg Hx   . Rectal cancer Neg Hx   . Stomach cancer Neg Hx     Social History   Tobacco Use  . Smoking status: Former Smoker    Packs/day: 2.00    Years: 10.00    Pack years: 20.00    Types: Cigarettes    Quit date: 12/28/1968    Years since quitting: 51.2  . Smokeless tobacco: Never Used  Vaping Use  . Vaping Use: Never used  Substance Use Topics  . Alcohol use: Yes    Alcohol/week:  7.0 - 14.0 standard drinks    Types: 7 - 14 Standard drinks or equivalent per week    Comment: bourbon  . Drug use: No    Home Medications Prior to Admission medications   Medication Sig Start Date End Date Taking? Authorizing Provider  albuterol (PROVENTIL HFA;VENTOLIN HFA) 108 (90 Base) MCG/ACT inhaler Inhale 1-2 puffs into the lungs every 6 (six) hours as needed for wheezing or shortness of breath. 12/24/16   Ripley Fraise, MD  albuterol (PROVENTIL) (2.5 MG/3ML) 0.083% nebulizer solution Take 3 mLs (2.5 mg total) by nebulization every 6 (six) hours as needed for wheezing or shortness of breath. Patient not taking: No sig reported 08/02/18   Sherrilyn Rist A, MD  allopurinol (ZYLOPRIM) 300 MG tablet Take 300 mg by mouth daily.    [provider]  aspirin 81 MG tablet Take 81 mg by mouth daily.    [provider]  budesonide (PULMICORT) 0.5 MG/2ML nebulizer solution INHALE TWO MILLILITERS VIA NEBULIZATION BY MOUTH TWICE A DAY Patient not taking: No sig reported 01/02/19   Sherrilyn Rist A, MD  esomeprazole (NEXIUM) 40 MG capsule TAKE ONE CAPSULE BY MOUTH DAILY IN THE MORNING Patient taking differently: Take 40 mg by mouth daily. 07/19/14   Ladene Artist, MD  fluticasone (FLONASE) 50 MCG/ACT nasal spray Place 1 spray into both nostrils daily. Patient not taking: No sig reported 04/02/17   Chesley Mires, MD  glucosamine-chondroitin 500-400 MG tablet Take 1 tablet by mouth daily.    [provider]  lisinopril-hydrochlorothiazide (PRINZIDE,ZESTORETIC) 10-12.5 MG per tablet Take 1 tablet by mouth daily.    [provider]  lovastatin (MEVACOR) 40 MG tablet Take 40 mg by mouth daily.     [provider]  Multiple Vitamins-Iron (MULTIVITAMINS WITH IRON) TABS Take 1 tablet by mouth daily.    [provider]  tamsulosin (FLOMAX) 0.4 MG CAPS capsule Take 1 capsule by mouth daily. 08/04/14   [provider]    Allergies    Patient has no known allergies.  Review of Systems   Review of Systems  HENT: Negative for tinnitus.   Respiratory: Negative for shortness of breath.   Cardiovascular: Negative for chest pain and palpitations.  Gastrointestinal: Positive for diarrhea, nausea and vomiting.  Neurological: Positive for dizziness. Negative for weakness and headaches.  All other systems reviewed and are negative.   Physical Exam Updated Vital Signs BP (!) 146/68   Pulse 80   Temp 97.6 F (36.4 C)   Resp 12   SpO2 91%   Physical Exam Vitals and nursing note reviewed.  Constitutional:      General: He is not in acute distress.    Appearance: He is well-developed.  HENT:     Head: Normocephalic and atraumatic.     Mouth/Throat:     Mouth: Mucous membranes are  moist.  Eyes:     General: No visual field deficit.    Conjunctiva/sclera: Conjunctivae normal.     Pupils: Pupils are equal, round, and reactive to light.  Cardiovascular:     Rate and Rhythm: Normal rate and regular rhythm. Frequent extrasystoles are present.    Heart sounds: No murmur heard.   Pulmonary:     Effort: Pulmonary effort is normal. No respiratory distress.     Breath sounds: Normal breath sounds. No wheezing or rales.  Abdominal:     General: There is no distension.     Palpations: Abdomen is soft.     Tenderness:  There is no abdominal tenderness. There is no guarding or rebound.  Musculoskeletal:        General: No tenderness. Normal range of motion.     Cervical back: Normal range of motion and neck supple.  Skin:    General: Skin is warm and dry.     Findings: No erythema or rash.  Neurological:     General: No focal deficit present.     Mental Status: He is alert and oriented to person, place, and time. Mental status is at baseline.     Cranial Nerves: No cranial nerve deficit or facial asymmetry.     Sensory: Sensation is intact. No sensory deficit.     Motor: No weakness or pronator drift.     Coordination: Coordination is intact. Finger-Nose-Finger Test and Heel to [facility] Test normal.     Comments: No nystagmus  Psychiatric:        Mood and Affect: Mood normal.        Behavior: Behavior normal.        Thought Content: Thought content normal.     ED Results / Procedures / Treatments   Labs (all labs ordered are listed, but only abnormal results are displayed) Labs Reviewed  CBC WITH DIFFERENTIAL/PLATELET - Abnormal; Notable for the following components:      Result Value   RBC 3.59 (*)    Hemoglobin 12.5 (*)    HCT 36.8 (*)    MCV 102.5 (*)    MCH 34.8 (*)    All other components within normal limits  COMPREHENSIVE METABOLIC PANEL - Abnormal; Notable for the following components:   Glucose, Bld 118 (*)    Creatinine, Ser 0.60 (*)    Calcium 8.4  (*)    Total Protein 6.4 (*)    All other components within normal limits  TROPONIN I (HIGH SENSITIVITY)  TROPONIN I (HIGH SENSITIVITY)    EKG EKG Interpretation  Date/Time:  Thursday March 28 2020 15:36:28 EDT Ventricular Rate:  78 PR Interval:    QRS Duration: 89 QT Interval:  406 QTC Calculation: 460 R Axis:   46 Text Interpretation: Sinus rhythm Premature ventricular complexes new Prolonged PR interval Confirmed by Blanchie Dessert [phone]) on 03/28/2020 3:38:35 PM   Radiology DG Chest Port 1 View  Result Date: 03/28/2020 CLINICAL DATA:  Dizziness, emesis, diarrhea EXAM: PORTABLE CHEST 1 VIEW COMPARISON:  06/13/2018 FINDINGS: Single frontal view of the chest demonstrates an enlarged cardiac silhouette, likely accentuated by portable AP technique. Mild chronic central vascular congestion without airspace disease, effusion, or pneumothorax. No acute bony abnormalities. IMPRESSION: 1. No acute intrathoracic process. Electronically Signed   By: Randa Ngo M.D.   On: 03/28/2020 16:10    Procedures Procedures   Medications Ordered in ED Medications  ondansetron (ZOFRAN) injection 4 mg (has no administration in time range)    ED Course  I have reviewed the triage vital signs and the nursing notes.  Pertinent labs & imaging results that were available during my care of the patient were reviewed by me and considered in my medical decision making (see chart for details).    MDM Rules/Calculators/A&P                          Patient is a 75 year old male presenting today with sudden onset of dizziness, emesis and one episode of diarrhea.  He was having abdominal cramps right before this occurred as well.  Patient describes the dizziness as feeling  lightheaded like he might pass out and reports that he was stumbling when try to walk.  He has not had any chest pain, shortness of breath or palpitations.  He currently reports that he feels sick but the nausea is not as severe.  No  abdominal pain at this time.  He has not had any known sick contacts.  No recent medication changes.  On exam he has no findings consistent with occipital stroke.  He has normal finger-to-nose and heel-to-shin and no nystagmus.  With EMS patient was hypotensive with a blood pressure of 96/64 but normal pulse and oxygen saturation.  Here patient's blood pressure is normal and he does report feeling better.  He is having frequent ectopy but denying any chest pain or shortness of breath.  He has no focal abdominal pain at this time.  Will give patient IV fluids and antiemetics.  Suspect that this could potentially be a viral cause.  5:16 PM Labs wnl.  EKG with new prolonged PR but o/w wnl.  Pt on re-eval feeling better.  Ambulated to the bathroom without dizziness and no signs of ataxia.  Feel pt dizziness most likely near syncope due to orthostatic hypotension.  Pt po challenged here without difficulty and will d/c home.  MDM Number of Diagnoses or Management Options   Amount and/or Complexity of Data Reviewed Clinical lab tests: ordered and reviewed Tests in the radiology section of CPT: ordered and reviewed Tests in the medicine section of CPT: ordered and reviewed Review and summarize past medical records: yes Independent visualization of images, tracings, or specimens: yes      Final Clinical Impression(s) / ED Diagnoses Final diagnoses:  Nausea vomiting and diarrhea  Near syncope    Rx / DC Orders ED Discharge Orders         Ordered    ondansetron (ZOFRAN ODT) 4 MG disintegrating tablet  Every 8 hours PRN        03/28/20 1724           Blanchie Dessert, MD 03/28/20 1724

## 2020-07-17 ENCOUNTER — Other Ambulatory Visit: Payer: Self-pay

## 2020-07-17 ENCOUNTER — Other Ambulatory Visit: Payer: Self-pay | Admitting: Family Medicine

## 2020-07-17 ENCOUNTER — Ambulatory Visit
Admission: RE | Admit: 2020-07-17 | Discharge: 2020-07-17 | Disposition: A | Discharge status: Home / Self Care | Payer: BC Managed Care – PPO | Source: Ambulatory Visit | Attending: Family Medicine | Admitting: Family Medicine

## 2020-07-17 DIAGNOSIS — R059 Cough, unspecified: Secondary | ICD-10-CM

## 2020-08-05 ENCOUNTER — Ambulatory Visit: Payer: Medicare Other | Admitting: Acute Care

## 2021-01-16 DIAGNOSIS — R42 Dizziness and giddiness: Secondary | ICD-10-CM | POA: Insufficient documentation

## 2021-01-16 DIAGNOSIS — I10 Essential (primary) hypertension: Secondary | ICD-10-CM | POA: Insufficient documentation

## 2021-01-16 NOTE — Progress Notes (Addendum)
Cardiology Office Note   Date:  01/17/2021   ID:  Jose Hines, DOB 1945/06/27, MRN [id number]  PCP:  Antony Contras, MD  Cardiologist:   None Referring:  Antony Contras, MD  Chief Complaint  Patient presents with   Dizziness      History of Present Illness: Jose Hines is a 76 y.o. male who presents for evaluation of dizziness.  He had been having this for quite a while.  In fact he been to the emergency room and it reviewed some records from the ED March.  He presented there with dizziness and near syncope.  He was hypotensive.  He has been having episodes like this for a while.  This was all thought to be related to orthostasis.  Apparently the EKG in the office was abnormal although I do not have that tracing yet.  I suspect he had some ectopy.  This was in December.  Shortly after that apparently his Flomax was stopped and he says his symptoms have resolved.  He was getting dizziness when standing.  He would have presyncope or near syncope.  However, he is no longer having this.  He never notices really any symptoms.  He is not getting chest pressure, neck or arm discomfort.  He is not having any PND or orthopnea.  He has no shortness of breath.  He has never had any prior cardiac testing.  He does have risk factors including family history and others as mentioned below.   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   Allergy    Depression    Diverticulosis    GERD with stricture    Hiatal hernia    Hypertension    Kidney stones    2 times - passed stones, no surgery required   Sleep apnea    wears CPAP   Wears hearing aid    Bil    Past Surgical History:  Procedure Laterality Date   COLONOSCOPY     INGUINAL HERNIA REPAIR     POLYPECTOMY     TONSILLECTOMY AND ADENOIDECTOMY       Current Outpatient Medications  Medication Sig Dispense Refill   albuterol (PROVENTIL HFA;VENTOLIN HFA) 108 (90 Base) MCG/ACT inhaler Inhale 1-2 puffs  into the lungs every 6 (six) hours as needed for wheezing or shortness of breath. 1 Inhaler 0   albuterol (PROVENTIL) (2.5 MG/3ML) 0.083% nebulizer solution Take 3 mLs (2.5 mg total) by nebulization every 6 (six) hours as needed for wheezing or shortness of breath. 75 mL 12   allopurinol (ZYLOPRIM) 300 MG tablet Take 300 mg by mouth daily.     aspirin 81 MG tablet Take 81 mg by mouth daily.     budesonide (PULMICORT) 0.5 MG/2ML nebulizer solution INHALE TWO MILLILITERS VIA NEBULIZATION BY MOUTH TWICE A DAY 120 mL 0   buPROPion (WELLBUTRIN XL) 150 MG 24 hr tablet Take 1 tablet by mouth every morning.     esomeprazole (NEXIUM) 40 MG capsule TAKE ONE CAPSULE BY MOUTH DAILY IN THE MORNING (Patient taking differently: Take 40 mg by mouth daily.) 30 capsule 0   fluticasone (FLONASE) 50 MCG/ACT nasal spray Place 1 spray into both nostrils daily. 16 g 2   glucosamine-chondroitin 500-400 MG tablet Take 1 tablet by mouth daily.     lisinopril-hydrochlorothiazide (PRINZIDE,ZESTORETIC) 10-12.5 MG per tablet Take 1 tablet by mouth daily.     lovastatin (MEVACOR) 40 MG tablet Take 40 mg by  mouth daily.      Multiple Vitamins-Iron (MULTIVITAMINS WITH IRON) TABS Take 1 tablet by mouth daily.     Current Facility-Administered Medications  Medication Dose Route Frequency Provider Last Rate Last Admin   0.9 %  sodium chloride infusion  500 mL Intravenous Once Ladene Artist, MD        Allergies:   Patient has no known allergies.    Social History:  The patient  reports that he quit smoking about 52 years ago. His smoking use included cigarettes. He has a 20.00 pack-year smoking history. He has never used smokeless tobacco. He reports current alcohol use of about 7.0 - 14.0 standard drinks per week. He reports that he does not use drugs.   Family History:  The patient's family history includes Heart disease (age of onset: 37) in his father; Transient ischemic attack in his mother.    ROS:  Please see the  history of present illness.   Otherwise, review of systems are positive for none.   All other systems are reviewed and negative.    PHYSICAL EXAM: VS:  BP (!) 148/78    Pulse 92    Resp (!) 94    Ht 5\' 11"  (1.803 m)    Wt 218 lb (98.9 kg)    BMI 30.40 kg/m  , BMI Body mass index is 30.4 kg/m. GENERAL:  Well appearing HEENT:  Pupils equal round and reactive, fundi not visualized, oral mucosa unremarkable NECK:  No jugular venous distention, waveform within normal limits, carotid upstroke brisk and symmetric, no bruits, no thyromegaly LYMPHATICS:  No cervical, inguinal adenopathy LUNGS:  Clear to auscultation bilaterally BACK:  No CVA tenderness CHEST:  Unremarkable HEART:  PMI not displaced or sustained,S1 and S2 within normal limits, no S3, no S4, no clicks, no rubs, no murmurs ABD:  Flat, positive bowel sounds normal in frequency in pitch, no bruits, no rebound, no guarding, no midline pulsatile mass, no hepatomegaly, no splenomegaly EXT:  2 plus pulses throughout, no edema, no cyanosis no clubbing SKIN:  No rashes no nodules NEURO:  Cranial nerves II through XII grossly intact, motor grossly intact throughout PSYCH:  Cognitively intact, oriented to person place and time    EKG:  EKG is ordered today. The ekg ordered today demonstrates sinus rhythm, rate 92, axis within normal limits, intervals within normal limits, no acute ST-T wave changes.  Her ventricular contractions versus PAC with aberrancy.   Recent Labs: 03/28/2020: ALT 26; BUN 10; Creatinine, Ser 0.60; Hemoglobin 12.5; Platelets 181; Potassium 3.5; Sodium 136    Lipid Panel No results found for: CHOL, TRIG, HDL, CHOLHDL, VLDL, LDLCALC, LDLDIRECT    Wt Readings from Last 3 Encounters:  01/17/21 218 lb (98.9 kg)  12/26/19 198 lb 9.6 oz (90.1 kg)  12/12/19 198 lb 9.6 oz (90.1 kg)      Other studies Reviewed: Additional studies/ records that were reviewed today include: Emergency room records, primary care office  records and labs. Review of the above records demonstrates:  Please see elsewhere in the note.     ASSESSMENT AND PLAN:  DIZZINESS: This seems to have been related to the tamsulosin.  He is much better off of this.  I do see that labs were recently normal including a TSH and electrolytes.  He does have some ectopy but he is not feeling this and I think this is likely unrelated.  I will obtain and review the EKG from his primary care office.  However, I do  not suspect a cardiac etiology and no further testing is suggested at this point.  HTN:   His blood pressure is slightly elevated and he should keep an eye on this.  I am hesitant to go up on the meds given his recent low blood pressures and dizziness.  I would suggest increase physical activity, low-salt few pounds of weight loss.  DYSLIPIDEMIA: His LDL was 83 with an HDL of 53.  No change in therapy.  ADDENDUM: I did review the EKG when he came to me.  There was ventricular ectopy every fourth beat on EKG.  Based on this I do not see a change and the suggestions above.  Current medicines are reviewed at length with the patient today.  The patient does not have concerns regarding medicines.  The following changes have been made:  no change  Labs/ tests ordered today include: None No orders of the defined types were placed in this encounter.   Patient Disposition:   FU with me as needed.      Signed, Minus Breeding, MD  01/17/2021 11:33 AM    [facility]

## 2021-01-17 ENCOUNTER — Ambulatory Visit (INDEPENDENT_AMBULATORY_CARE_PROVIDER_SITE_OTHER): Payer: Medicare PPO | Admitting: Cardiology

## 2021-01-17 ENCOUNTER — Other Ambulatory Visit: Payer: Self-pay

## 2021-01-17 ENCOUNTER — Encounter: Payer: Self-pay | Admitting: Cardiology

## 2021-01-17 VITALS — BP 148/78 | HR 92 | Resp 94 | Ht 71.0 in | Wt 218.0 lb

## 2021-01-17 DIAGNOSIS — I10 Essential (primary) hypertension: Secondary | ICD-10-CM | POA: Diagnosis not present

## 2021-01-17 DIAGNOSIS — R42 Dizziness and giddiness: Secondary | ICD-10-CM

## 2021-01-17 NOTE — Patient Instructions (Signed)
Medication Instructions:  None ordered   Lab Work: None ordered   Testing/Procedures: None ordered   Follow-Up: At [facility], you and your health needs are our priority.  As part of our continuing mission to provide you with exceptional heart care, we have created designated Provider Care Teams.  These Care Teams include your primary Cardiologist (physician) and Advanced Practice Providers (APPs -  Physician Assistants and Nurse Practitioners) who all work together to provide you with the care you need, when you need it.  We recommend signing up for the patient portal called "MyChart".  Sign up information is provided on this After Visit Summary.  MyChart is used to connect with patients for Virtual Visits (Telemedicine).  Patients are able to view lab/test results, encounter notes, upcoming appointments, etc.  Non-urgent messages can be sent to your provider as well.   To learn more about what you can do with MyChart, go to [email or web address].      Your next appointment:  As Needed    The format for your next appointment: Office   Provider:  Dr.Hochrein

## 2021-01-27 NOTE — Addendum Note (Signed)
Addended by: Wonda Horner on: 01/27/2021 04:59 PM   Modules accepted: Orders

## 2022-08-06 DIAGNOSIS — R0609 Other forms of dyspnea: Secondary | ICD-10-CM | POA: Diagnosis not present

## 2022-08-06 DIAGNOSIS — F3341 Major depressive disorder, recurrent, in partial remission: Secondary | ICD-10-CM | POA: Diagnosis not present

## 2022-08-06 DIAGNOSIS — G621 Alcoholic polyneuropathy: Secondary | ICD-10-CM | POA: Diagnosis not present

## 2022-08-06 DIAGNOSIS — N400 Enlarged prostate without lower urinary tract symptoms: Secondary | ICD-10-CM | POA: Diagnosis not present

## 2022-08-06 DIAGNOSIS — E7800 Pure hypercholesterolemia, unspecified: Secondary | ICD-10-CM | POA: Diagnosis not present

## 2022-08-06 DIAGNOSIS — M109 Gout, unspecified: Secondary | ICD-10-CM | POA: Diagnosis not present

## 2022-08-06 DIAGNOSIS — J4489 Other specified chronic obstructive pulmonary disease: Secondary | ICD-10-CM | POA: Diagnosis not present

## 2022-08-06 DIAGNOSIS — I10 Essential (primary) hypertension: Secondary | ICD-10-CM | POA: Diagnosis not present

## 2022-08-06 DIAGNOSIS — K219 Gastro-esophageal reflux disease without esophagitis: Secondary | ICD-10-CM | POA: Diagnosis not present

## 2022-10-01 DIAGNOSIS — L578 Other skin changes due to chronic exposure to nonionizing radiation: Secondary | ICD-10-CM | POA: Diagnosis not present

## 2022-10-01 DIAGNOSIS — L570 Actinic keratosis: Secondary | ICD-10-CM | POA: Diagnosis not present

## 2022-10-01 DIAGNOSIS — Z85828 Personal history of other malignant neoplasm of skin: Secondary | ICD-10-CM | POA: Diagnosis not present

## 2022-10-01 DIAGNOSIS — D225 Melanocytic nevi of trunk: Secondary | ICD-10-CM | POA: Diagnosis not present

## 2022-10-01 DIAGNOSIS — L2089 Other atopic dermatitis: Secondary | ICD-10-CM | POA: Diagnosis not present

## 2022-10-01 DIAGNOSIS — L821 Other seborrheic keratosis: Secondary | ICD-10-CM | POA: Diagnosis not present

## 2022-10-01 DIAGNOSIS — L304 Erythema intertrigo: Secondary | ICD-10-CM | POA: Diagnosis not present

## 2022-10-01 DIAGNOSIS — L814 Other melanin hyperpigmentation: Secondary | ICD-10-CM | POA: Diagnosis not present

## 2022-10-09 ENCOUNTER — Ambulatory Visit: Payer: Medicare PPO | Attending: Cardiology | Admitting: Cardiology

## 2022-10-09 ENCOUNTER — Encounter: Payer: Self-pay | Admitting: Cardiology

## 2022-10-09 VITALS — BP 140/76 | HR 93 | Resp 16 | Ht 71.0 in | Wt 220.2 lb

## 2022-10-09 DIAGNOSIS — J8410 Pulmonary fibrosis, unspecified: Secondary | ICD-10-CM

## 2022-10-09 DIAGNOSIS — I10 Essential (primary) hypertension: Secondary | ICD-10-CM

## 2022-10-09 DIAGNOSIS — R9431 Abnormal electrocardiogram [ECG] [EKG]: Secondary | ICD-10-CM | POA: Diagnosis not present

## 2022-10-09 DIAGNOSIS — E7800 Pure hypercholesterolemia, unspecified: Secondary | ICD-10-CM

## 2022-10-09 DIAGNOSIS — I493 Ventricular premature depolarization: Secondary | ICD-10-CM | POA: Diagnosis not present

## 2022-10-09 DIAGNOSIS — R0609 Other forms of dyspnea: Secondary | ICD-10-CM

## 2022-10-09 MED ORDER — ROSUVASTATIN CALCIUM 20 MG PO TABS: 20.0000 mg | ORAL_TABLET | Freq: Every day | ORAL | 3 refills | Status: DC

## 2022-10-09 MED ORDER — LOSARTAN POTASSIUM-HCTZ 50-12.5 MG PO TABS: 1.0000 | ORAL_TABLET | Freq: Every day | ORAL | 3 refills | Status: DC

## 2022-10-09 NOTE — Patient Instructions (Addendum)
Medication Instructions:  Your physician has recommended you make the following change in your medication:   1) STOP lovastatin 2) STOP lisinopril-hydrochlorothiazide 3) START losartan-hydrochlorothiazide (Hyzaar) 50-12.5 mg daily 4) START rosuvastatin (Crestor) 20 mg daily  *If you need a refill on your cardiac medications before your next appointment, please call your pharmacy*  Lab Work: In 2 months (1-2 weeks prior to next office visit): fasting Lipid panel If you have labs (blood work) drawn today and your tests are completely normal, you will receive your results only by: MyChart Message (if you have MyChart) OR A paper copy in the mail If you have any lab test that is abnormal or we need to change your treatment, we will call you to review the results.  Testing/Procedures: Your physician has requested that you have an echocardiogram. Echocardiography is a painless test that uses sound waves to create images of your heart. It provides your doctor with information about the size and shape of your heart and how well your heart's chambers and valves are working. This procedure takes approximately one hour. There are no restrictions for this procedure. Please do NOT wear cologne, perfume, aftershave, or lotions (deodorant is allowed). Please arrive 15 minutes prior to your appointment time.  Your physician has requested that you have a lexiscan myoview. For further information please visit [email or web address] Please follow instruction sheet, as given.   Follow-Up: At [facility], you and your health needs are our priority.  As part of our continuing mission to provide you with exceptional heart care, we have created designated Provider Care Teams.  These Care Teams include your primary Cardiologist (physician) and Advanced Practice Providers (APPs -  Physician Assistants and Nurse Practitioners) who all work together to provide you with the care you need, when you need it.  We  recommend signing up for the patient portal called "MyChart".  Sign up information is provided on this After Visit Summary.  MyChart is used to connect with patients for Virtual Visits (Telemedicine).  Patients are able to view lab/test results, encounter notes, upcoming appointments, etc.  Non-urgent messages can be sent to your provider as well.   To learn more about what you can do with MyChart, go to [email or web address].    Your next appointment:   2 month(s)  The format for your next appointment:   In Person  Provider:   Yates Decamp, MD {  Other Instructions You have been referred to [facility]. A scheduler will call you to schedule an appointment with one of their pulmonologists to evaluate your pulmonary fibrosis and shortness of breath.   Lexiscan Myoview (Stress Test) Instructions  Please arrive 15 minutes prior to your appointment time for registration and insurance purposes.   The test will take approximately 3 to 4 hours to complete; you may bring reading material.  If someone comes with you to your appointment, they will need to remain in the main lobby due to limited space in the testing area. **If you are pregnant or breastfeeding, please notify the nuclear lab prior to your appointment**   How to prepare for your Myocardial Perfusion Test: Do not eat or drink 3 hours prior to your test, except you may have water. Do not consume products containing caffeine (regular or decaffeinated) 12 hours prior to your test. (ex: coffee, chocolate, sodas, tea). Do bring a list of your current medications with you.  If not listed below, you may take your medications as normal. Do wear comfortable clothes (no dresses or  overalls) and walking shoes, tennis shoes preferred (No heels or open toe shoes are allowed). Do NOT wear cologne, perfume, aftershave, or lotions (deodorant is allowed). If these instructions are not followed, your test will have to be rescheduled.   Please  report to [address], [address] for your test.  If you have questions or concerns about your appointment, you can call the Nuclear Lab at [phone].   If you cannot keep your appointment, please provide 24 hours notification to the Nuclear Lab, to avoid a possible $50 charge to your account.

## 2022-10-09 NOTE — Progress Notes (Unsigned)
Cardiology Office Note:  .   Date:  10/10/2022  ID:  Jose Hines, DOB 1945-10-05, MRN [id number] PCP: Tally Joe, MD  [facility] Providers Cardiologist:  Yates Decamp, MD    History of Present Illness: .   Jose Hines is a 77 y.o. 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  Discussed the use of AI scribe software for clinical note transcription with the patient, who gave verbal consent to proceed.  History of Present Illness   The patient, with a history of mild COPD and heavy smoking 40 years ago, presents with worsening shortness of breath and fatigue over the past year. He reports a lack of energy, needing to rest after making a round or two when mowing the grass. He denies chest discomfort during these episodes. He uses a CPAP machine regularly for sleep apnea, but it has not been evaluated for several years. He wakes up at night feeling short of breath and reports that he must be tossing and turning during sleep. He also experiences pain in his back due to arthritis, which he initially thought was a hip problem. He has been drinking alcohol daily, but cut down significantly two years ago from heavy drinking.     Review of Systems  Cardiovascular:  Positive for dyspnea on exertion. Negative for chest pain and leg swelling.    Risk Assessment/Calculations:     HYPERTENSION CONTROL Vitals:   10/09/22 1016 10/09/22 2222  BP: (!) 142/76 (!) 140/76    The patient's blood pressure is elevated above target today.  In order to address the patient's elevated BP: A new medication was prescribed today.    No results found for: "CHOL", "HDL", "LDLCALC", "LDLDIRECT", "TRIG", "CHOLHDL" Lab Results  Component Value Date   NA 136  03/28/2020   K 3.5 03/28/2020   CO2 24 03/28/2020   GLUCOSE 118 (H) 03/28/2020   BUN 10 03/28/2020   CREATININE 0.60 (L) 03/28/2020   CALCIUM 8.4 (L) 03/28/2020   GFRNONAA >60 03/28/2020   Lab Results  Component Value Date   WBC 9.9 03/28/2020   HGB 12.5 (L) 03/28/2020   HCT 36.8 (L) 03/28/2020   MCV 102.5 (H) 03/28/2020   PLT 181 03/28/2020    External Labs:  Labs 08/06/2022:  Serum glucose 1 135 mg, BUN 12, creatinine 0.7, EGFR 94 mL, sodium 140, potassium 4.1, LFTs normal except for minimally elevated AST at 48 (0-39).  Uric acid 5.7, normal.  Protocol is normal and 72, triglycerides 127, HDL 48, LDL 101.   TSH normal at 3.00.  Hb 14.1/HCT 43.6, platelets 267.  Microcytic indicis.  Physical Exam:   VS:  BP (!) 140/76   Pulse 93   Resp 16   Ht 5\' 11"  (1.803 m)   Wt 220 lb 3.2 oz (99.9 kg)   SpO2 96%   BMI 30.71 kg/m    Wt Readings from Last 3 Encounters:  10/09/22 220 lb 3.2 oz (99.9 kg)  01/17/21 218 lb (98.9 kg)  12/26/19 198 lb 9.6 oz (90.1 kg)     Physical Exam Neck:     Vascular: No carotid bruit or JVD.  Cardiovascular:     Rate and Rhythm: Normal rate and regular rhythm.     Pulses: Intact distal pulses.     Heart  sounds: Normal heart sounds. No murmur heard.    No gallop.  Pulmonary:     Effort: Pulmonary effort is normal.     Breath sounds: Examination of the right-middle field reveals rhonchi. Examination of the right-lower field reveals rhonchi. Examination of the left-lower field reveals rhonchi. Rhonchi present.  Abdominal:     General: Bowel sounds are normal.     Palpations: Abdomen is soft.  Musculoskeletal:     Right lower leg: No edema.     Left lower leg: No edema.     Studies Reviewed: Marland Kitchen   EKG Interpretation Date/Time:  Friday October 09 2022 10:15:22 EDT Ventricular Rate:  95 PR Interval:  178 QRS Duration:  78 QT Interval:  340 QTC Calculation: 427 R Axis:   57  Text Interpretation: EKG 10/09/2022: Normal sinus rhythm at  rate of 95 bpm, normal axis, frequent PVCs (4 including 1 ventricular couplet).  Abnormal EKG.  Compared to 03/28/2020, no significant change, there were 3 fusion complexes and 1 PVC. Confirmed by Delrae Rend [phone]) on 10/09/2022 10:23:16 AM    Chest x-ray two-view 07/18/2020: Normal cardiac size, mediastinal contour and pulmonary vascularity. Bronchitic changes with bibasilar atelectasis.  ASSESSMENT AND PLAN: .      ICD-10-CM   1. DOE (dyspnea on exertion)  R06.09 EKG 12-Lead    ECHOCARDIOGRAM COMPLETE    MYOCARDIAL PERFUSION IMAGING    Ambulatory referral to Pulmonology    2. Primary hypertension  I10     3. Hypercholesteremia  E78.00 Lipid panel    4. 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 Pulmonary fibrosis (HCC)  J84.10 Ambulatory referral to Pulmonology    7. Abnormal electrocardiogram  R94.31 MYOCARDIAL PERFUSION IMAGING      Assessment and Plan  Cardiac Evaluation Abnormal EKG and shortness of breath. Suspect frequent PVCs to be related to underlying chronic hypoxemia secondary to COPD and possible pulmonary fibrosis versus coronary ischemia. -Order Lexiscan nuclear stress test. Patient unable to exercise stress due to arthritis and dyspnea -Order echocardiogram.  Chronic Obstructive Pulmonary Disease (COPD) Worsening shortness of breath and fatigue over the past year. History of heavy smoking. Noted possible development of significant scar tissue in lungs. I suspect pulmonary fibrosis especially involving the right lower lungs.. -Refer to Pulmonology for evaluation and possible CT scan of chest. -Consider re-evaluation of CPAP machine settings.  Sleep Apnea Patient uses CPAP machine, but settings have not been evaluated in several years. Possible contribution to fatigue and poor sleep quality. Has been recommended repeat sleep study and is being  addressed by his PCP  Hyperlipidemia Currently on Lovastatin. LDL not at goal. <70 -Discontinue Lovastatin. -Start Crestor 20mg  daily. -Check lipids in 2 months.  Hypertension Currently on Lisinopril HCT. -Discontinue Lisinopril HCT. -Start Losartan HCT 50/12.5mg  daily.  Alcohol Use Daily alcohol use, with history of heavy use. Noted effects on bone marrow and blood cells. -Advise to limit alcohol intake to no more than 1-2 drinks per day.  Follow-up in 2 months.   Signed,  Yates Decamp, MD, [facility] 10/10/2022, 1:23 PM

## 2022-10-12 ENCOUNTER — Telehealth: Payer: Self-pay | Admitting: Cardiology

## 2022-10-12 DIAGNOSIS — R9431 Abnormal electrocardiogram [ECG] [EKG]: Secondary | ICD-10-CM

## 2022-10-12 NOTE — Telephone Encounter (Signed)
Please refer to sleep with [place], he had seen Craige Cotta, [place] before and he no longer is in the system. Thank you

## 2022-10-12 NOTE — Telephone Encounter (Signed)
Patient is calling to talk with Dr. Jacinto Halim or nurse. Please call back

## 2022-10-12 NOTE — Telephone Encounter (Signed)
Spoke with patient and he stated you informed him that its important for patient to wear his CPAP machine . He states his mask is broken and he is unable to use his CPAP machine. He sees Dr. Chewing. He has not been seen in over 5 years. He is considered a new patient and he can not get an appointment for 6 months. He would like to know id you can call Dr. Chewing office to see if they can move his appointment up [phone]?  Did advise we have no control over they schedule.  Dr. Mayford Knife here at [facility] and Dr. Tresa Endo at [facility] does sleep medicine as well. Not sure if you want him to be seen here in office. Not sure how far out they schedules are.

## 2022-10-13 NOTE — Telephone Encounter (Signed)
Spoke with patient referral to Dr. Mayford Knife for sleep medicine placed. He is aware someone will give him a call to schedule

## 2022-10-15 ENCOUNTER — Telehealth (HOSPITAL_COMMUNITY): Payer: Self-pay

## 2022-10-15 NOTE — Telephone Encounter (Signed)
Detailed instructions left on the patient's answering machine. Asked to call back with any questions. S.Aby Gessel CCT

## 2022-10-20 ENCOUNTER — Ambulatory Visit (HOSPITAL_COMMUNITY): Payer: Medicare PPO | Attending: Internal Medicine

## 2022-10-20 DIAGNOSIS — R0609 Other forms of dyspnea: Secondary | ICD-10-CM | POA: Diagnosis not present

## 2022-10-20 DIAGNOSIS — R9431 Abnormal electrocardiogram [ECG] [EKG]: Secondary | ICD-10-CM | POA: Insufficient documentation

## 2022-10-20 LAB — MYOCARDIAL PERFUSION IMAGING
LV dias vol: 221 mL (ref 62–150)
LV sys vol: 162 mL
Nuc Stress EF: 27 %
Peak HR: 87 {beats}/min
Rest HR: 80 {beats}/min
Rest Nuclear Isotope Dose: 10.2 mCi
SDS: 3
SRS: 4
SSS: 7
ST Depression (mm): 0 mm
Stress Nuclear Isotope Dose: 31.2 mCi
TID: 0.98

## 2022-10-20 MED ORDER — TECHNETIUM TC 99M TETROFOSMIN IV KIT
31.2000 | PACK | Freq: Once | INTRAVENOUS | Status: AC | PRN
  Administered 2022-10-20: 31.2 via INTRAVENOUS

## 2022-10-20 MED ORDER — TECHNETIUM TC 99M TETROFOSMIN IV KIT
10.2000 | PACK | Freq: Once | INTRAVENOUS | Status: AC | PRN
  Administered 2022-10-20: 10.2 via INTRAVENOUS

## 2022-10-20 MED ORDER — REGADENOSON 0.4 MG/5ML IV SOLN
0.4000 mg | Freq: Once | INTRAVENOUS | Status: AC
  Administered 2022-10-20: 0.4 mg via INTRAVENOUS

## 2022-10-23 NOTE — Progress Notes (Signed)
He has abnormal stres. Not urgent, it is chronic, but would like to see him sooner than two months. 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 The study is high risk.   LV perfusion is abnormal. There is no evidence of ischemia. There is evidence of infarction.  Defect 1: There is a large defect with severe reduction in uptake present in the apical to basal inferior and apex location(s) that is partially reversible. There is abnormal wall motion in the defect area. Consistent with infarction and peri-infarct ischemia.   Left ventricular function is abnormal. Global function is severely reduced. There was a single regional abnormality. Nuclear stress EF: 27%. The left ventricular ejection fraction is severely decreased (<30%). End diastolic cavity size is severely enlarged. End systolic cavity size is severely enlarged.   Prior study not available for comparison.

## 2022-10-29 ENCOUNTER — Ambulatory Visit (HOSPITAL_COMMUNITY): Payer: Medicare PPO | Attending: Cardiology

## 2022-10-29 DIAGNOSIS — R0609 Other forms of dyspnea: Secondary | ICD-10-CM | POA: Diagnosis not present

## 2022-10-29 DIAGNOSIS — R9431 Abnormal electrocardiogram [ECG] [EKG]: Secondary | ICD-10-CM | POA: Diagnosis not present

## 2022-10-29 LAB — ECHOCARDIOGRAM COMPLETE
Area-P 1/2: 3.87 cm2
MV M vel: 4.34 m/s
MV Peak grad: 75.3 mm[Hg]
S' Lateral: 6 cm

## 2022-10-30 NOTE — Progress Notes (Signed)
Echocardiogram 10/29/2022:  1. Left ventricular ejection fraction, by estimation, is 30 to 35%. The left ventricle has moderately decreased function. The left ventricle demonstrates global hypokinesis. The left ventricular internal cavity size was moderately to severely dilated. Left ventricular diastolic parameters are consistent with Grade II diastolic dysfunction (pseudonormalization). The average left ventricular global longitudinal strain is -14.1 %. The global longitudinal strain is abnormal.  2. Right ventricular systolic function is normal. The right ventricular size is normal.  3. Left atrial size was severely dilated.  4. The mitral valve is normal in structure. Trivial mitral valve regurgitation. No evidence of mitral stenosis.  5. The aortic valve is tricuspid. Aortic valve regurgitation is trivial. Aortic valve sclerosis/calcification is present, without any evidence of aortic stenosis.  6. Pulmonic valve regurgitation is moderate.  7. The inferior vena cava is normal in size with greater than 50% respiratory variability, suggesting right atrial pressure of 3 mmHg.

## 2022-11-05 ENCOUNTER — Encounter: Payer: Self-pay | Admitting: Gastroenterology

## 2022-11-10 ENCOUNTER — Encounter: Payer: Self-pay | Admitting: Gastroenterology

## 2022-11-12 NOTE — Progress Notes (Addendum)
Cardiology Office Note    Date:  11/13/2022  ID:  Jose Hines, DOB September 21, 1945, MRN [id number] PCP:  Tally Joe, MD  Cardiologist:  Yates Decamp, MD  Electrophysiologist:  None   Chief Complaint: f/u cardiac testing  History of Present Illness: .    Jose Hines is a 77 y.o. male with visit-pertinent history of HTN, HLD, OSA on CPAP, COPD, alcoholic peripheral neuropathy, former tobacco abuse recently established with Dr. Jacinto Halim in 10/2022 for SOB and found to have frequent PVCs. Lisinopril HCT changed to losartan HCT, lovastatin changed to rosuvastatin. He was also referred to pulm for question of pulmonary fibrosis by exam and Dr. Tresa Endo for sleep medicine management. NST 10/20/22 was abnormal with inferior and apical perfusion defects, which are partially reversible suggestive of infarct without significant ischemia or possibly a degree of diaphragmatic attenuation (some improvement in stress upright images compared to stress supine); LV severely dilated and hypokinetic, worse inferiorly. LVEF 27% (echo correlation is advised). 2D echo 10/29/22 showed EF 30-35% with global HK, G2DD, mod-severe LVE, severe LAE, aortic sclerosis without stenosis, moderate PR. Dr. Jacinto Halim recommended f/u appointment to discuss.  He returns for follow-up to discuss above testing. He denies any chest pain but reports 2-3 history of exertional dyspnea. He reports having a skip in his heartbeat all his life. He used to drink ETOH more heavily but states he drinks 1, no more than 2 drinks a day now. He has not had any syncope, orthopnea, PND. He recalls having episodes of recurrent syncope about 2 years ago when he was on tamsulosin. His father died of second MI in his 19s, the first occurring earlier in life in setting of stress.  Labwork independently reviewed: KPN 08/2022 K 4.1, trig 127, LDL 101, trig 127, 01/2022 Hgb 14.1 2022 K 3.5, Cr 0.60, AST ALT OK, Hgb 12.5, plt 181 2020 TSH ok  ROS: .    Please see the  history of present illness. All other systems are reviewed and otherwise negative.  Studies Reviewed: Marland Kitchen    EKG:  EKG is ordered today, personally reviewed, demonstrating NSR 82bpm, occasional PVC, diffuse nonspecific STTW changes, QTC  CV Studies: Cardiac studies reviewed are outlined and summarized above. Otherwise please see EMR for full report.   Current Reported Medications:.    Current Meds  Medication Sig   albuterol (PROVENTIL HFA;VENTOLIN HFA) 108 (90 Base) MCG/ACT inhaler Inhale 1-2 puffs into the lungs every 6 (six) hours as needed for wheezing or shortness of breath.   albuterol (PROVENTIL) (2.5 MG/3ML) 0.083% nebulizer solution Take 3 mLs (2.5 mg total) by nebulization every 6 (six) hours as needed for wheezing or shortness of breath.   allopurinol (ZYLOPRIM) 300 MG tablet Take 300 mg by mouth daily.   aspirin 81 MG tablet Take 81 mg by mouth daily.   budesonide (PULMICORT) 0.5 MG/2ML nebulizer solution INHALE TWO MILLILITERS VIA NEBULIZATION BY MOUTH TWICE A DAY   buPROPion (WELLBUTRIN XL) 150 MG 24 hr tablet Take 1 tablet by mouth every morning.   esomeprazole (NEXIUM) 40 MG capsule TAKE ONE CAPSULE BY MOUTH DAILY IN THE MORNING (Patient taking differently: Take 40 mg by mouth daily.)   fluticasone (FLONASE) 50 MCG/ACT nasal spray Place 1 spray into both nostrils daily.   glucosamine-chondroitin 500-400 MG tablet Take 1 tablet by mouth daily.   losartan-hydrochlorothiazide (HYZAAR) 50-12.5 MG tablet Take 1 tablet by mouth daily.   Multiple Vitamins-Iron (MULTIVITAMINS WITH IRON) TABS Take 1 tablet by mouth daily.  rosuvastatin (CRESTOR) 20 MG tablet Take 1 tablet (20 mg total) by mouth daily.    Physical Exam:    VS:  BP 108/66   Pulse 97   Ht 5\' 11"  (1.803 m)   Wt 227 lb 12.8 oz (103.3 kg)   SpO2 95%   BMI 31.77 kg/m    Wt Readings from Last 3 Encounters:  11/13/22 227 lb 12.8 oz (103.3 kg)  10/09/22 220 lb 3.2 oz (99.9 kg)  01/17/21 218 lb (98.9 kg)     GEN: Well nourished, well developed in no acute distress NECK: No JVD; No carotid bruits CARDIAC: RRR, no murmurs, rubs, gallops RESPIRATORY:  Clear to auscultation without rales, wheezing or rhonchi  ABDOMEN: Soft, non-tender, non-distended EXTREMITIES:  No edema; No acute deformity   Asessement and Plan:.    1. Cardiomyopathy/chronic HFrEF, Essential HTN, DOE - etiology of cardiomyopathy needs to be determined whether ischemic or non-ischemic. Volume status looks OK. Discussed with Dr. Excell Seltzer as Dr. Jacinto Halim is out of the office. Will plan for cardiac catheterization - right and left chosen given patient's DOE, upper-limits-normal HR, h/o orthostasis with previous tamsulosin to ensure preserved CO. Since BP is 108/66 today, I will hold off acute med titration pending cardiac cath results. Would look towards likely adding BB if cardiac output okay and switching losartan-HCT to Entresto. Will also get baseline labs and 2V CXR for completeness. At last visit Dr. Jacinto Halim referred patient to pulmonology (quit tobacco 40 years ago) and also arranged sleep consult with Dr. Tresa Endo. Continue ASA. Discussed monitoring for symptoms of HF and daily weights. I will also send a message with typical CHF recommendations to his MyChart per his request. If cath negative for CAD would consider ETOH or PVC cardiomyopathy in ddx, with consideration of cMRI if indicated in follow-up.  Informed Consent   Shared Decision Making/Informed Consent The risks [stroke (1 in 1000), death (1 in 1000), kidney failure [usually temporary] (1 in 500), bleeding (1 in 200), allergic reaction [possibly serious] (1 in 200)], benefits (diagnostic support and management of coronary artery disease) and alternatives of a cardiac catheterization were discussed in detail with Jose Hines and he is willing to proceed. He states is willing to undergo whatever workup needed for his heart. He is noted to have blood products refusal on his chart. I  contacted him post visit to confirm and got VM. We will confirm when we call with lab results.    2. Pulmonic regurgitation - follow in context above. No clear murmur on exam.  3. PVCs - quiescent on exam, only one noted on his EKG today. If his cardiac cath is negative for coronary artery disease, would consider 3 day Zio to evaluate PVC burden for contribution to cardiomyopathy. Update lytes and TSH today. Consider addition of beta blocker depending on cath results.  4. Hyperlipidemia - Dr. Jacinto Halim titrated statin last visit, will have him come fasting to next OV to obtain at that time.  5. ETOH abuse - discussed importance of long term cessation. He jokes that he has been drinking since age 85 when his family used to ride back roads and drink. He's made strides in cutting down.     Disposition: F/u with APP or Dr. Jacinto Halim 2 weeks post cath.  Signed, Laurann Montana, PA-C

## 2022-11-12 NOTE — H&P (View-Only) (Signed)
Cardiology Office Note    Date:  11/13/2022  ID:  Jose Hines, DOB 19-Sep-1945, MRN [id number] PCP:  Tally Joe, MD  Cardiologist:  Yates Decamp, MD  Electrophysiologist:  None   Chief Complaint: f/u cardiac testing  History of Present Illness: .    Jose Hines is a 77 y.o. male with visit-pertinent history of HTN, HLD, OSA on CPAP, COPD, alcoholic peripheral neuropathy, former tobacco abuse recently established with Dr. Jacinto Halim in 10/2022 for SOB and found to have frequent PVCs. Lisinopril HCT changed to losartan HCT, lovastatin changed to rosuvastatin. He was also referred to pulm for question of pulmonary fibrosis by exam and Dr. Tresa Endo for sleep medicine management. NST 10/20/22 was abnormal with inferior and apical perfusion defects, which are partially reversible suggestive of infarct without significant ischemia or possibly a degree of diaphragmatic attenuation (some improvement in stress upright images compared to stress supine); LV severely dilated and hypokinetic, worse inferiorly. LVEF 27% (echo correlation is advised). 2D echo 10/29/22 showed EF 30-35% with global HK, G2DD, mod-severe LVE, severe LAE, aortic sclerosis without stenosis, moderate PR. Dr. Jacinto Halim recommended f/u appointment to discuss.  He returns for follow-up to discuss above testing. He denies any chest pain but reports 2-3 history of exertional dyspnea. He reports having a skip in his heartbeat all his life. He used to drink ETOH more heavily but states he drinks 1, no more than 2 drinks a day now. He has not had any syncope, orthopnea, PND. He recalls having episodes of recurrent syncope about 2 years ago when he was on tamsulosin. His father died of second MI in his 72s, the first occurring earlier in life in setting of stress.  Labwork independently reviewed: KPN 08/2022 K 4.1, trig 127, LDL 101, trig 127, 01/2022 Hgb 14.1 2022 K 3.5, Cr 0.60, AST ALT OK, Hgb 12.5, plt 181 2020 TSH ok  ROS: .    Please see the  history of present illness. All other systems are reviewed and otherwise negative.  Studies Reviewed: Marland Kitchen    EKG:  EKG is ordered today, personally reviewed, demonstrating NSR 82bpm, occasional PVC, diffuse nonspecific STTW changes, QTC  CV Studies: Cardiac studies reviewed are outlined and summarized above. Otherwise please see EMR for full report.   Current Reported Medications:.    Current Meds  Medication Sig   albuterol (PROVENTIL HFA;VENTOLIN HFA) 108 (90 Base) MCG/ACT inhaler Inhale 1-2 puffs into the lungs every 6 (six) hours as needed for wheezing or shortness of breath.   albuterol (PROVENTIL) (2.5 MG/3ML) 0.083% nebulizer solution Take 3 mLs (2.5 mg total) by nebulization every 6 (six) hours as needed for wheezing or shortness of breath.   allopurinol (ZYLOPRIM) 300 MG tablet Take 300 mg by mouth daily.   aspirin 81 MG tablet Take 81 mg by mouth daily.   budesonide (PULMICORT) 0.5 MG/2ML nebulizer solution INHALE TWO MILLILITERS VIA NEBULIZATION BY MOUTH TWICE A DAY   buPROPion (WELLBUTRIN XL) 150 MG 24 hr tablet Take 1 tablet by mouth every morning.   esomeprazole (NEXIUM) 40 MG capsule TAKE ONE CAPSULE BY MOUTH DAILY IN THE MORNING (Patient taking differently: Take 40 mg by mouth daily.)   fluticasone (FLONASE) 50 MCG/ACT nasal spray Place 1 spray into both nostrils daily.   glucosamine-chondroitin 500-400 MG tablet Take 1 tablet by mouth daily.   losartan-hydrochlorothiazide (HYZAAR) 50-12.5 MG tablet Take 1 tablet by mouth daily.   Multiple Vitamins-Iron (MULTIVITAMINS WITH IRON) TABS Take 1 tablet by mouth daily.  rosuvastatin (CRESTOR) 20 MG tablet Take 1 tablet (20 mg total) by mouth daily.    Physical Exam:    VS:  BP 108/66   Pulse 97   Ht 5\' 11"  (1.803 m)   Wt 227 lb 12.8 oz (103.3 kg)   SpO2 95%   BMI 31.77 kg/m    Wt Readings from Last 3 Encounters:  11/13/22 227 lb 12.8 oz (103.3 kg)  10/09/22 220 lb 3.2 oz (99.9 kg)  01/17/21 218 lb (98.9 kg)     GEN: Well nourished, well developed in no acute distress NECK: No JVD; No carotid bruits CARDIAC: RRR, no murmurs, rubs, gallops RESPIRATORY:  Clear to auscultation without rales, wheezing or rhonchi  ABDOMEN: Soft, non-tender, non-distended EXTREMITIES:  No edema; No acute deformity   Asessement and Plan:.    1. Cardiomyopathy/chronic HFrEF, Essential HTN, DOE - etiology of cardiomyopathy needs to be determined whether ischemic or non-ischemic. Volume status looks OK. Discussed with Dr. Excell Seltzer as Dr. Jacinto Halim is out of the office. Will plan for cardiac catheterization - right and left chosen given patient's DOE, upper-limits-normal HR, h/o orthostasis with previous tamsulosin to ensure preserved CO. Since BP is 108/66 today, I will hold off acute med titration pending cardiac cath results. Would look towards likely adding BB if cardiac output okay and switching losartan-HCT to Entresto. Will also get baseline labs and 2V CXR for completeness. At last visit Dr. Jacinto Halim referred patient to pulmonology (quit tobacco 40 years ago) and also arranged sleep consult with Dr. Tresa Endo. Continue ASA. Discussed monitoring for symptoms of HF and daily weights. I will also send a message with typical CHF recommendations to his MyChart per his request. If cath negative for CAD would consider ETOH or PVC cardiomyopathy in ddx, with consideration of cMRI if indicated in follow-up.  Informed Consent   Shared Decision Making/Informed Consent The risks [stroke (1 in 1000), death (1 in 1000), kidney failure [usually temporary] (1 in 500), bleeding (1 in 200), allergic reaction [possibly serious] (1 in 200)], benefits (diagnostic support and management of coronary artery disease) and alternatives of a cardiac catheterization were discussed in detail with [name] and he is willing to proceed. He states is willing to undergo whatever workup needed for his heart.   After the visit, he was noted to have blood products refusal  on his Active FYI section, added in 2022. I contacted him post visit to confirm and got VM. We will confirm when we call with lab results. ADDENDUM 11/17/22. He reports that the notation of refusal of blood products is inaccurate. This was entered by someone in 2022 without further details. The patient is not a Jehovah's Witness and is open to blood products if needed, so this refusal FYI was removed from his chart per his request.    2. Pulmonic regurgitation - follow in context above. No clear murmur on exam.  3. PVCs - quiescent on exam, only one noted on his EKG today. If his cardiac cath is negative for coronary artery disease, would consider 3 day Zio to evaluate PVC burden for contribution to cardiomyopathy. Update lytes and TSH today. Consider addition of beta blocker depending on cath results.  4. Hyperlipidemia - Dr. Jacinto Halim titrated statin last visit, will have him come fasting to next OV to obtain at that time.  5. ETOH abuse - discussed importance of long term cessation. He jokes that he has been drinking since age 67 when his family used to ride back roads and drink.  He's made strides in cutting down.     Disposition: F/u with APP or Dr. Jacinto Halim 2 weeks post cath.  Signed, Laurann Montana, PA-C

## 2022-11-13 ENCOUNTER — Ambulatory Visit
Admission: RE | Admit: 2022-11-13 | Discharge: 2022-11-13 | Disposition: A | Discharge status: Home / Self Care | Payer: Medicare PPO | Source: Ambulatory Visit | Attending: Physician Assistant | Admitting: Physician Assistant

## 2022-11-13 ENCOUNTER — Ambulatory Visit: Payer: Medicare PPO | Attending: Physician Assistant | Admitting: Physician Assistant

## 2022-11-13 ENCOUNTER — Encounter: Payer: Self-pay | Admitting: Physician Assistant

## 2022-11-13 VITALS — BP 108/66 | HR 97 | Ht 71.0 in | Wt 227.8 lb

## 2022-11-13 DIAGNOSIS — I10 Essential (primary) hypertension: Secondary | ICD-10-CM | POA: Diagnosis not present

## 2022-11-13 DIAGNOSIS — J984 Other disorders of lung: Secondary | ICD-10-CM | POA: Diagnosis not present

## 2022-11-13 DIAGNOSIS — I371 Nonrheumatic pulmonary valve insufficiency: Secondary | ICD-10-CM

## 2022-11-13 DIAGNOSIS — E785 Hyperlipidemia, unspecified: Secondary | ICD-10-CM

## 2022-11-13 DIAGNOSIS — R0609 Other forms of dyspnea: Secondary | ICD-10-CM

## 2022-11-13 DIAGNOSIS — I517 Cardiomegaly: Secondary | ICD-10-CM | POA: Diagnosis not present

## 2022-11-13 DIAGNOSIS — I429 Cardiomyopathy, unspecified: Secondary | ICD-10-CM | POA: Diagnosis not present

## 2022-11-13 DIAGNOSIS — F1010 Alcohol abuse, uncomplicated: Secondary | ICD-10-CM | POA: Diagnosis not present

## 2022-11-13 DIAGNOSIS — I5022 Chronic systolic (congestive) heart failure: Secondary | ICD-10-CM

## 2022-11-13 DIAGNOSIS — I493 Ventricular premature depolarization: Secondary | ICD-10-CM | POA: Diagnosis not present

## 2022-11-13 NOTE — Patient Instructions (Signed)
Medication Instructions:  Your physician recommends that you continue on your current medications as directed. Please refer to the Current Medication list given to you today.  *If you need a refill on your cardiac medications before your next appointment, please call your pharmacy*   Lab Work: TODAY:  CMET, CBC, MAG, & PRO BNP   If you have labs (blood work) drawn today and your tests are completely normal, you will receive your results only by: MyChart Message (if you have MyChart) OR A paper copy in the mail If you have any lab test that is abnormal or we need to change your treatment, we will call you to review the results.  A chest x-ray takes a picture of the organs and structures inside the chest, including the heart, lungs, and blood vessels. This test can show several things, including, whether the heart is enlarges; whether fluid is building up in the lungs; and whether pacemaker / defibrillator leads are still in place. GO TO [facility] TODAY  [address]  [phone]  Testing/Procedures: Your physician has requested that you have a cardiac catheterization. Cardiac catheterization is used to diagnose and/or treat various heart conditions. Doctors may recommend this procedure for a number of different reasons. The most common reason is to evaluate chest pain. Chest pain can be a symptom of coronary artery disease (CAD), and cardiac catheterization can show whether plaque is narrowing or blocking your heart's arteries. This procedure is also used to evaluate the valves, as well as measure the blood flow and oxygen levels in different parts of your heart. For further information please visit [email or web address] Please follow instruction sheet, BELOW:        Cardiac/Peripheral Catheterization   You are scheduled for a Cardiac Catheterization on Thursday, November 14 with Dr. Verne Carrow.  1. Please arrive at the [facility] (Main Entrance A) at [facility]: [address] [place], [place] [address] at 10:00 AM (This time is 2 hour(s) before your procedure to ensure your preparation). Free valet parking service is available. You will check in at ADMITTING. The support person will be asked to wait in the waiting room.  It is OK to have someone drop you off and come back when you are ready to be discharged.        Special note: Every effort is made to have your procedure done on time. Please understand that emergencies sometimes delay scheduled procedures.  2. Diet: Do not eat solid foods after midnight.  You may have clear liquids until 5 AM the day of the procedure.  3. Labs: You will need to have blood drawn on TODA  4. Medication instructions in preparation for your procedure:   Contrast Allergy: No   DO NOT TAKE LOSARTAN-hydrochlorothiazide THE MORNING OF    On the morning of your procedure, take Aspirin 81 mg and any morning medicines NOT listed above.  You may use sips of water.  5. Plan to go home the same day, you will only stay overnight if medically necessary. 6. You MUST have a responsible adult to drive you home. 7. An adult MUST be with you the first 24 hours after you arrive home. 8. Bring a current list of your medications, and the last time and date medication taken. 9. Bring ID and current insurance cards. 10.Please wear clothes that are easy to get on and off and wear slip-on shoes.  Thank you for allowing [place] to care for you!   --  [facility]    Follow-Up: At [facility], you and your health needs are our priority.  As part of our continuing mission to provide you with exceptional heart care, we have created designated Provider Care Teams.  These Care Teams include your primary Cardiologist (physician) and Advanced Practice Providers (APPs -  Physician Assistants and Nurse Practitioners) who all work together to provide you with the care you need, when you need  it.  We recommend signing up for the patient portal called "MyChart".  Sign up information is provided on this After Visit Summary.  MyChart is used to connect with patients for Virtual Visits (Telemedicine).  Patients are able to view lab/test results, encounter notes, upcoming appointments, etc.  Non-urgent messages can be sent to your provider as well.   To learn more about what you can do with MyChart, go to [email or web address].    Your next appointment:   2 week(s)  Provider:   Yates Decamp, MD  or Ronie Spies, PA-C         Other Instructions

## 2022-11-14 LAB — TSH: TSH: 1.61 u[IU]/mL (ref 0.450–4.500)

## 2022-11-17 ENCOUNTER — Telehealth: Payer: Self-pay | Admitting: *Deleted

## 2022-11-17 ENCOUNTER — Telehealth: Payer: Self-pay | Admitting: Physician Assistant

## 2022-11-17 LAB — COMPREHENSIVE METABOLIC PANEL
ALT: 24 [IU]/L (ref 0–44)
AST: 31 IU/L (ref 0–40)
Albumin: 4 g/dL (ref 3.8–4.8)
Alkaline Phosphatase: 94 IU/L (ref 44–121)
BUN/Creatinine Ratio: 15 (ref 10–24)
BUN: 12 mg/dL (ref 8–27)
Bilirubin Total: 0.2 mg/dL (ref 0.0–1.2)
CO2: 22 mmol/L (ref 20–29)
Calcium: 9.2 mg/dL (ref 8.6–10.2)
Chloride: 102 mmol/L (ref 96–106)
Creatinine, Ser: 0.81 mg/dL (ref 0.76–1.27)
Globulin, Total: 2.6 g/dL (ref 1.5–4.5)
Glucose: 146 mg/dL — ABNORMAL HIGH (ref 70–99)
Potassium: 4.3 mmol/L (ref 3.5–5.2)
Sodium: 140 mmol/L (ref 134–144)
Total Protein: 6.6 g/dL (ref 6.0–8.5)
eGFR: 91 mL/min/{1.73_m2} (ref >59)

## 2022-11-17 LAB — CBC
Hematocrit: 38.2 % (ref 37.5–51.0)
Hemoglobin: 13 g/dL (ref 13.0–17.7)
MCH: 35.2 pg — ABNORMAL HIGH (ref 26.6–33.0)
MCHC: 34 g/dL (ref 31.5–35.7)
MCV: 104 fL — ABNORMAL HIGH (ref 79–97)
Platelets: 206 10*3/uL (ref 150–450)
RBC: 3.69 x10E6/uL — ABNORMAL LOW (ref 4.14–5.80)
RDW: 11.9 % (ref 11.6–15.4)
WBC: 11 10*3/uL — ABNORMAL HIGH (ref 3.4–10.8)

## 2022-11-17 LAB — MAGNESIUM: Magnesium: 1.8 mg/dL (ref 1.6–2.3)

## 2022-11-17 LAB — PRO B NATRIURETIC PEPTIDE: NT-Pro BNP: 400 pg/mL (ref 0–486)

## 2022-11-17 MED ORDER — MAGNESIUM OXIDE 400 MG PO TABS: 400.0000 mg | ORAL_TABLET | Freq: Every day | ORAL | 3 refills | Status: DC

## 2022-11-17 NOTE — Telephone Encounter (Signed)
Cardiac Catheterization scheduled at [facility] for: Thursday November 19, 2022 12 Noon Arrival time [facility] Main Entrance A at: 10 AM  Nothing to eat after midnight prior to procedure, clear liquids until 5 AM day of procedure.  Medication instructions: -Hold:  Losartan-HCT-AM of procedure  -Other usual morning medications can be taken with sips of water including aspirin 81 mg.  Plan to go home the same day, you will only stay overnight if medically necessary.  You must have responsible adult to drive you home.  Someone must be with you the first 24 hours after you arrive home.  Reviewed procedure instructions with patient's wife, Lupita Leash, at patient's request.

## 2022-11-17 NOTE — Telephone Encounter (Addendum)
Called patient to discuss lab results and clarify refusal of blood products for charting. Got VM. LMTCB. Addendum: patient called back. We discussed his labs. I advised he f/u PCP for elevated blood sugar and chronic appearing elevated WBC. I advised he start MagOx 400mg  daily for Mg 1.8 in setting of PVCs, can review plan for f/u labs at post cath appt, rx sent in to [facility] at [facility].   He reports that the notation of refusal of blood products is inaccurate. This was entered by someone in 2022 without further details. The patient is not a Jehovah's Witness and is open to blood products if needed, so this was removed from his chart per his request.

## 2022-11-17 NOTE — Telephone Encounter (Signed)
Patient requested I contact his wife to review instructions, no answer.

## 2022-11-18 NOTE — Telephone Encounter (Signed)
Reviewed procedure instructions with patient's wife, Lupita Leash, at patient's request.

## 2022-11-18 NOTE — Telephone Encounter (Signed)
Wife is returning call. Please advise

## 2022-11-19 ENCOUNTER — Other Ambulatory Visit: Payer: Self-pay

## 2022-11-19 ENCOUNTER — Telehealth: Payer: Self-pay | Admitting: *Deleted

## 2022-11-19 ENCOUNTER — Encounter (HOSPITAL_COMMUNITY)
Admission: RE | Disposition: A | Discharge status: Home / Self Care | Payer: Self-pay | Source: Home / Self Care | Attending: Cardiovascular Disease

## 2022-11-19 ENCOUNTER — Ambulatory Visit (HOSPITAL_COMMUNITY)
Admission: RE | Admit: 2022-11-19 | Discharge: 2022-11-19 | Disposition: A | Discharge status: Home / Self Care | Payer: Medicare PPO | Attending: Cardiovascular Disease | Admitting: Cardiovascular Disease

## 2022-11-19 DIAGNOSIS — R0609 Other forms of dyspnea: Secondary | ICD-10-CM

## 2022-11-19 DIAGNOSIS — I255 Ischemic cardiomyopathy: Secondary | ICD-10-CM | POA: Diagnosis not present

## 2022-11-19 DIAGNOSIS — G4733 Obstructive sleep apnea (adult) (pediatric): Secondary | ICD-10-CM | POA: Insufficient documentation

## 2022-11-19 DIAGNOSIS — Z87891 Personal history of nicotine dependence: Secondary | ICD-10-CM | POA: Insufficient documentation

## 2022-11-19 DIAGNOSIS — E785 Hyperlipidemia, unspecified: Secondary | ICD-10-CM | POA: Diagnosis not present

## 2022-11-19 DIAGNOSIS — Z7982 Long term (current) use of aspirin: Secondary | ICD-10-CM | POA: Diagnosis not present

## 2022-11-19 DIAGNOSIS — G621 Alcoholic polyneuropathy: Secondary | ICD-10-CM | POA: Insufficient documentation

## 2022-11-19 DIAGNOSIS — I2510 Atherosclerotic heart disease of native coronary artery without angina pectoris: Secondary | ICD-10-CM | POA: Diagnosis not present

## 2022-11-19 DIAGNOSIS — Z8249 Family history of ischemic heart disease and other diseases of the circulatory system: Secondary | ICD-10-CM | POA: Insufficient documentation

## 2022-11-19 DIAGNOSIS — I2584 Coronary atherosclerosis due to calcified coronary lesion: Secondary | ICD-10-CM | POA: Diagnosis not present

## 2022-11-19 DIAGNOSIS — Z79899 Other long term (current) drug therapy: Secondary | ICD-10-CM | POA: Diagnosis not present

## 2022-11-19 DIAGNOSIS — I5022 Chronic systolic (congestive) heart failure: Secondary | ICD-10-CM

## 2022-11-19 DIAGNOSIS — J449 Chronic obstructive pulmonary disease, unspecified: Secondary | ICD-10-CM | POA: Diagnosis not present

## 2022-11-19 DIAGNOSIS — I2582 Chronic total occlusion of coronary artery: Secondary | ICD-10-CM | POA: Diagnosis not present

## 2022-11-19 DIAGNOSIS — I429 Cardiomyopathy, unspecified: Secondary | ICD-10-CM

## 2022-11-19 DIAGNOSIS — I10 Essential (primary) hypertension: Secondary | ICD-10-CM | POA: Insufficient documentation

## 2022-11-19 DIAGNOSIS — F1010 Alcohol abuse, uncomplicated: Secondary | ICD-10-CM | POA: Diagnosis not present

## 2022-11-19 DIAGNOSIS — J8410 Pulmonary fibrosis, unspecified: Secondary | ICD-10-CM

## 2022-11-19 HISTORY — PX: RIGHT/LEFT HEART CATH AND CORONARY ANGIOGRAPHY: CATH118266

## 2022-11-19 LAB — POCT I-STAT EG7
Acid-Base Excess: 1 mmol/L (ref 0.0–2.0)
Bicarbonate: 26.7 mmol/L (ref 20.0–28.0)
Calcium, Ion: 1.12 mmol/L — ABNORMAL LOW (ref 1.15–1.40)
HCT: 36 % — ABNORMAL LOW (ref 39.0–52.0)
Hemoglobin: 12.2 g/dL — ABNORMAL LOW (ref 13.0–17.0)
O2 Saturation: 64 %
Potassium: 3.5 mmol/L (ref 3.5–5.1)
Sodium: 142 mmol/L (ref 135–145)
TCO2: 28 mmol/L (ref 22–32)
pCO2, Ven: 43.8 mm[Hg] — ABNORMAL LOW (ref 44–60)
pH, Ven: 7.393 (ref 7.25–7.43)
pO2, Ven: 33 mm[Hg] (ref 32–45)

## 2022-11-19 LAB — POCT I-STAT 7, (LYTES, BLD GAS, ICA,H+H)
Acid-Base Excess: 1 mmol/L (ref 0.0–2.0)
Bicarbonate: 25.5 mmol/L (ref 20.0–28.0)
Calcium, Ion: 1.19 mmol/L (ref 1.15–1.40)
HCT: 38 % — ABNORMAL LOW (ref 39.0–52.0)
Hemoglobin: 12.9 g/dL — ABNORMAL LOW (ref 13.0–17.0)
O2 Saturation: 91 %
Potassium: 3.7 mmol/L (ref 3.5–5.1)
Sodium: 140 mmol/L (ref 135–145)
TCO2: 27 mmol/L (ref 22–32)
pCO2 arterial: 40.5 mm[Hg] (ref 32–48)
pH, Arterial: 7.408 (ref 7.35–7.45)
pO2, Arterial: 61 mm[Hg] — ABNORMAL LOW (ref 83–108)

## 2022-11-19 SURGERY — RIGHT/LEFT HEART CATH AND CORONARY ANGIOGRAPHY
Anesthesia: LOCAL

## 2022-11-19 MED ORDER — HEPARIN SODIUM (PORCINE) 1000 UNIT/ML IJ SOLN
INTRAMUSCULAR | Status: AC
  Filled 2022-11-19: qty 10

## 2022-11-19 MED ORDER — HEPARIN SODIUM (PORCINE) 1000 UNIT/ML IJ SOLN
INTRAMUSCULAR | Status: DC | PRN
  Administered 2022-11-19: 5000 [IU] via INTRAVENOUS

## 2022-11-19 MED ORDER — ACETAMINOPHEN 325 MG PO TABS: 650.0000 mg | ORAL_TABLET | ORAL | Status: DC | PRN

## 2022-11-19 MED ORDER — SODIUM CHLORIDE 0.9 % IV SOLN: INTRAVENOUS | Status: DC

## 2022-11-19 MED ORDER — ASPIRIN 81 MG PO CHEW: 81.0000 mg | CHEWABLE_TABLET | ORAL | Status: DC

## 2022-11-19 MED ORDER — ONDANSETRON HCL 4 MG/2ML IJ SOLN: 4.0000 mg | Freq: Four times a day (QID) | INTRAMUSCULAR | Status: DC | PRN

## 2022-11-19 MED ORDER — SODIUM CHLORIDE 0.9% FLUSH: 10.0000 mL | Freq: Two times a day (BID) | INTRAVENOUS | Status: DC

## 2022-11-19 MED ORDER — HYDRALAZINE HCL 20 MG/ML IJ SOLN: 10.0000 mg | INTRAMUSCULAR | Status: DC | PRN

## 2022-11-19 MED ORDER — LABETALOL HCL 5 MG/ML IV SOLN: 10.0000 mg | INTRAVENOUS | Status: DC | PRN

## 2022-11-19 MED ORDER — MIDAZOLAM HCL 2 MG/2ML IJ SOLN
INTRAMUSCULAR | Status: AC
  Filled 2022-11-19: qty 2

## 2022-11-19 MED ORDER — VERAPAMIL HCL 2.5 MG/ML IV SOLN
INTRAVENOUS | Status: AC
  Filled 2022-11-19: qty 2

## 2022-11-19 MED ORDER — MIDAZOLAM HCL 2 MG/2ML IJ SOLN
INTRAMUSCULAR | Status: DC | PRN
  Administered 2022-11-19: 1 mg via INTRAVENOUS

## 2022-11-19 MED ORDER — FUROSEMIDE 40 MG PO TABS: 40.0000 mg | ORAL_TABLET | Freq: Every day | ORAL | 3 refills | Status: DC

## 2022-11-19 MED ORDER — SODIUM CHLORIDE 0.9% FLUSH: 3.0000 mL | INTRAVENOUS | Status: DC | PRN

## 2022-11-19 MED ORDER — LIDOCAINE HCL (PF) 1 % IJ SOLN
INTRAMUSCULAR | Status: DC | PRN
  Administered 2022-11-19: 2 mL
  Administered 2022-11-19: 3 mL

## 2022-11-19 MED ORDER — FENTANYL CITRATE (PF) 100 MCG/2ML IJ SOLN
INTRAMUSCULAR | Status: AC
  Filled 2022-11-19: qty 2

## 2022-11-19 MED ORDER — HEPARIN (PORCINE) IN NACL 1000-0.9 UT/500ML-% IV SOLN
INTRAVENOUS | Status: DC | PRN
  Administered 2022-11-19 (×2): 500 mL

## 2022-11-19 MED ORDER — FENTANYL CITRATE (PF) 100 MCG/2ML IJ SOLN
INTRAMUSCULAR | Status: DC | PRN
  Administered 2022-11-19: 25 ug via INTRAVENOUS

## 2022-11-19 MED ORDER — SODIUM CHLORIDE 0.9 % IV SOLN: 250.0000 mL | INTRAVENOUS | Status: DC | PRN

## 2022-11-19 MED ORDER — LIDOCAINE HCL (PF) 1 % IJ SOLN
INTRAMUSCULAR | Status: AC
  Filled 2022-11-19: qty 30

## 2022-11-19 MED ORDER — VERAPAMIL HCL 2.5 MG/ML IV SOLN
INTRAVENOUS | Status: DC | PRN
  Administered 2022-11-19: 10 mL via INTRA_ARTERIAL

## 2022-11-19 MED ORDER — IOHEXOL 350 MG/ML SOLN
INTRAVENOUS | Status: DC | PRN
  Administered 2022-11-19: 70 mL

## 2022-11-19 SURGICAL SUPPLY — 11 items
CATH 5FR JL3.5 JR4 ANG PIG MP (CATHETERS) IMPLANT
CATH BALLN WEDGE 5F 110CM (CATHETERS) IMPLANT
CATH INFINITI 5 FR AR1 MOD (CATHETERS) IMPLANT
CATH INFINITI 5FR AL1 (CATHETERS) IMPLANT
DEVICE RAD COMP TR BAND LRG (VASCULAR PRODUCTS) IMPLANT
GLIDESHEATH SLEND SS 6F .021 (SHEATH) IMPLANT
GUIDEWIRE INQWIRE 1.5J.035X260 (WIRE) IMPLANT
INQWIRE 1.5J .035X260CM (WIRE) ×1
PACK CARDIAC CATHETERIZATION (CUSTOM PROCEDURE TRAY) ×1 IMPLANT
SET ATX-X65L (MISCELLANEOUS) IMPLANT
SHEATH GLIDE SLENDER 4/5FR (SHEATH) IMPLANT

## 2022-11-19 NOTE — Progress Notes (Signed)
TR BAND REMOVAL  LOCATION:    right radial  DEFLATED PER PROTOCOL:    Yes.    TIME BAND OFF / DRESSING APPLIED:    1445 gauze dressing applied   SITE UPON ARRIVAL:    Level 0  SITE AFTER BAND REMOVAL:    Level 0  CIRCULATION SENSATION AND MOVEMENT:    Within Normal Limits   Yes.    COMMENTS:   no issues noted

## 2022-11-19 NOTE — Interval H&P Note (Signed)
History and Physical Interval Note:  11/19/2022 10:06 AM  Jose Hines.  has presented today for surgery, with the diagnosis of cardiomyopathy.  The various methods of treatment have been discussed with the patient and family. After consideration of risks, benefits and other options for treatment, the patient has consented to  Procedure(s): RIGHT/LEFT HEART CATH AND CORONARY ANGIOGRAPHY (N/A) as a surgical intervention.  The patient's history has been reviewed, patient examined, no change in status, stable for surgery.  I have reviewed the patient's chart and labs.  Questions were answered to the patient's satisfaction.    Cath Lab Visit (complete for each Cath Lab visit)  Clinical Evaluation Leading to the Procedure:   ACS: No.  Non-ACS:    Anginal Classification: CCS II  Anti-ischemic medical therapy: No Therapy  Non-Invasive Test Results: High risk stress test findings  Prior CABG: No previous CABG        Verne Carrow

## 2022-11-19 NOTE — Discharge Instructions (Signed)

## 2022-11-20 ENCOUNTER — Encounter (HOSPITAL_COMMUNITY): Payer: Self-pay | Admitting: Cardiovascular Disease

## 2022-11-23 ENCOUNTER — Encounter: Payer: Self-pay | Admitting: Physician Assistant

## 2022-11-23 NOTE — Telephone Encounter (Signed)
I spoke with patient and he is aware to have blood work checked this Friday. Dr Jacinto Halim asked that I follow up on pulmonary referral.  I spoke with patient who reports he did not receive a call regarding appointment. Referral has been closed.  New referral placed and patient given phone number for pulmonary.  Patient will call pulmonary to schedule appointment

## 2022-11-23 NOTE — Telephone Encounter (Signed)
Patient viewed mychart message  ?

## 2022-11-23 NOTE — Progress Notes (Signed)
Dr. Clifton James has been communicating with Dr. Jacinto Halim behind the scenes regarding plan of care. I discussed med plan with Dr. Clifton James for the time being - he recommends to leave other meds alone pending re-eval by Dr. Jacinto Halim in clinic with the exception of the new Lasix he prescribed (see other phone note). Dr. Jacinto Halim replied that he will discuss with patient further the options in clinic f/u as scheduled 12/2.

## 2022-11-23 NOTE — Telephone Encounter (Signed)
Patient has been scheduled to see Dr Marchelle Gearing on January 14, 2023

## 2022-11-23 NOTE — Telephone Encounter (Signed)
-----   Message from Franchot Gallo sent at 11/23/2022  8:47 AM EST ----- Regarding: FW: Referral to pulmonary Lurene Shadow,  It looks like patient refused pulmonology referral when called to schedule. ----- Message ----- From: Yates Decamp, MD Sent: 11/23/2022   4:15 AM EST To: [facility] Scheduling; [facility] Triage Subject: Referral to pulmonary                          I had referred him to pulmonary to evaluate his status of dyspnea and abnormal physical exam. Can you please follow up on this as I do not see an appointment and it is relatively urgent as we may have to consider CABG vs PCI soon.  JG

## 2022-11-24 ENCOUNTER — Encounter: Payer: Self-pay | Admitting: Cardiology

## 2022-11-25 ENCOUNTER — Telehealth: Payer: Self-pay | Admitting: Internal Medicine

## 2022-11-25 NOTE — Telephone Encounter (Signed)
Front desk   Javon Selina Cooley. Has appt with me in January but Dr Jacinto Halim requesting urgent consult due to CABG need. Has ILD. Can you get him in 11/30/22 1 pm please as add on

## 2022-11-25 NOTE — Telephone Encounter (Signed)
He is seeing you on Dec 9th. I am trying to make a determination whether he needs and is indeed a candidate for CABG versus PCI depending upon his PFTs as he is a heavy smoker.  Is it possible that one of the PAs or NP's can see him and schedule him for PFTs even before you see him this way I will make a early determination.

## 2022-11-26 ENCOUNTER — Other Ambulatory Visit: Payer: Medicare PPO

## 2022-11-26 NOTE — Telephone Encounter (Signed)
Jose Hines- Can you please set up appt as req by Dr. Elvera Lennox. I see a 15 min 11:00 appt but can not sched at 1:00 as requested.

## 2022-11-27 ENCOUNTER — Other Ambulatory Visit: Payer: Self-pay | Admitting: Cardiology

## 2022-11-27 NOTE — Telephone Encounter (Signed)
Appointment made- [facility] aware for staffing, nothing further needed.

## 2022-11-27 NOTE — Telephone Encounter (Signed)
Patient states he could come in on Monday November 25 at 1:00pm

## 2022-11-27 NOTE — Telephone Encounter (Signed)
Hold on guys. I now have a meeting at [facility] hospital at 1.15pm. I cannot change that now I will hve to finish that and then come back and see him but I am strting vacation as well. WHo else can see him next week? IF not I have to come back from meeting to do consult

## 2022-11-27 NOTE — Telephone Encounter (Signed)
LM for PT to call to see if 11/25 is a good day for him to come in.

## 2022-11-28 LAB — BASIC METABOLIC PANEL
BUN/Creatinine Ratio: 17 (ref 10–24)
BUN: 13 mg/dL (ref 8–27)
CO2: 27 mmol/L (ref 20–29)
Calcium: 9.7 mg/dL (ref 8.6–10.2)
Chloride: 97 mmol/L (ref 96–106)
Creatinine, Ser: 0.77 mg/dL (ref 0.76–1.27)
Glucose: 150 mg/dL — ABNORMAL HIGH (ref 70–99)
Potassium: 4.2 mmol/L (ref 3.5–5.2)
Sodium: 140 mmol/L (ref 134–144)
eGFR: 92 mL/min/{1.73_m2} (ref >59)

## 2022-11-28 LAB — LIPID PANEL
Chol/HDL Ratio: 3 ratio (ref 0.0–5.0)
Cholesterol, Total: 137 mg/dL (ref 100–199)
HDL: 46 mg/dL (ref >39)
LDL Chol Calc (NIH): 62 mg/dL (ref 0–99)
Triglycerides: 175 mg/dL — ABNORMAL HIGH (ref 0–149)
VLDL Cholesterol Cal: 29 mg/dL (ref 5–40)

## 2022-11-30 ENCOUNTER — Institutional Professional Consult (permissible substitution): Payer: Medicare PPO | Admitting: Internal Medicine

## 2022-11-30 NOTE — Telephone Encounter (Signed)
Patient scheduled for 12/16/22 with MR

## 2022-12-04 NOTE — Progress Notes (Unsigned)
Cardiology Office Note:  .   Date:  12/07/2022  ID:  Jose Fujita., DOB 04/23/1945, MRN [id number] PCP: Tally Joe, MD  [facility] Providers Cardiologist:  Yates Decamp, MD   History of Present Illness: .   Jose Hines. is a 77 y.o. 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  In view of his symptoms, underwent echocardiogram, exercise nuclear stress test which was high risk with severely reduced LVEF and hence underwent left heart catheterization revealing occluded RCA and severe calcific stenosis in the LAD.  He now presents to discuss options of revascularization versus medical therapy.  Discussed the use of AI scribe software for clinical note transcription with the patient, who gave verbal consent to proceed.  History of Present Illness   The patient, with a history of smoking and heavy alcohol use, presents with increasing shortness of breath. He reports a lifelong difficulty with physical exertion and breathing, even in childhood. An echocardiogram revealed a reduced heart function of 35%, approximately half of the normal function. A subsequent heart catheterization found one artery completely blocked and another significantly narrowed. The patient has recently reduced his alcohol consumption significantly, from several drinks a day to one drink a day, over the past two weeks. He also reports a history of smoking, starting at a young age, but quit approximately 40 years ago.      Review of Systems  Cardiovascular:  Positive for dyspnea on exertion. Negative for chest pain and leg swelling.    Labs   Lab Results  Component Value Date   CHOL 137 11/27/2022   HDL 46 11/27/2022   LDLCALC 62 11/27/2022   TRIG 175 (H)  11/27/2022   CHOLHDL 3.0 11/27/2022   Lab Results  Component Value Date   NA 140 11/27/2022   K 4.2 11/27/2022   CO2 27 11/27/2022   GLUCOSE 150 (H) 11/27/2022   BUN 13 11/27/2022   CREATININE 0.77 11/27/2022   CALCIUM 9.7 11/27/2022   EGFR 92 11/27/2022   GFRNONAA >60 03/28/2020      Latest Ref Rng & Units 11/27/2022    4:19 PM 11/19/2022   12:34 PM 11/13/2022    3:39 PM  BMP  Glucose 70 - 99 mg/dL 308   657   BUN 8 - 27 mg/dL 13   12   Creatinine 8.46 - 1.27 mg/dL 9.62   9.52   BUN/Creat Ratio 10 - 24 17   15    Sodium 134 - 144 mmol/L 140  142    140  140   Potassium 3.5 - 5.2 mmol/L 4.2  3.5    3.7  4.3   Chloride 96 - 106 mmol/L 97   102   CO2 20 - 29 mmol/L 27   22   Calcium 8.6 - 10.2 mg/dL 9.7   9.2        Latest Ref Rng & Units 11/19/2022   12:34 PM 11/13/2022    3:39 PM 03/28/2020    3:20 PM  CBC  WBC 3.4 - 10.8 x10E3/uL  11.0  9.9   Hemoglobin 13.0 - 17.0 g/dL 84.1 - 32.4 g/dL 40.1    02.7  25.3  66.4   Hematocrit 39.0 - 52.0 % 39.0 - 52.0 % 36.0    38.0  38.2  36.8   Platelets 150 - 450 x10E3/uL  206  181     External Labs:  Labs 08/06/2022:   Serum glucose 1 135 mg, BUN 12, creatinine 0.7, EGFR 94 mL, sodium 140, potassium 4.1, LFTs normal except for minimally elevated AST at 48 (0-39).  Uric acid 5.7, normal.   Protocol is normal and 72, triglycerides 127, HDL 48, LDL 101.    TSH normal at 3.00.   Hb 14.1/HCT 43.6, platelets 267.  Microcytic indicis.  Physical Exam:   VS:  BP 118/72 (BP Location: Left Arm, Patient Position: Sitting, Cuff Size: Large)   Pulse 86   Resp 16   Ht 5\' 11"  (1.803 m)   Wt 226 lb (102.5 kg)   SpO2 96%   BMI 31.52 kg/m    Wt Readings from Last 3 Encounters:  12/07/22 226 lb (102.5 kg)  11/19/22 226 lb (102.5 kg)  11/13/22 227 lb 12.8 oz (103.3 kg)     Physical Exam Neck:     Vascular: No carotid bruit or JVD.  Cardiovascular:     Rate and Rhythm: Normal rate and regular rhythm.     Pulses: Intact distal  pulses.     Heart sounds: Normal heart sounds. No murmur heard.    No gallop.  Pulmonary:     Effort: Pulmonary effort is normal. Prolonged expiration present.     Breath sounds: Examination of the right-middle field reveals rhonchi. Examination of the right-lower field reveals rhonchi. Examination of the left-lower field reveals rhonchi. Rhonchi present.  Abdominal:     General: Bowel sounds are normal.     Palpations: Abdomen is soft.  Musculoskeletal:     Right lower leg: No edema.     Left lower leg: No edema.     Studies Reviewed: Marland Kitchen    Chest x-ray 11/13/2022 and comparison 07/17/2020: Cardiac silhouette is prominent. There is stable scarring in the lung bases which is unchanged. Lungs are otherwise clear. No pneumothorax or pleural effusion. Normal pulmonary vasculature. Osseous structures appear grossly intact.  Regadenoson (with Mod Bruce protocol) Nuclear stress test 10/19/2022:   Findings are consistent with infarction with peri-infarct ischemia. The study is high risk.   LV perfusion is abnormal. There is no evidence of ischemia. There is evidence of infarction. There is a large defect with severe reduction in uptake present in the apical to basal inferior and apex location(s) that is partially reversible. There is abnormal wall motion in the defect area. Consistent with infarction and peri-infarct ischemia.   Left ventricular function is abnormal. Global function is severely reduced. There was a single regional abnormality. Nuclear stress EF: 27%. The left ventricular ejection fraction is severely decreased (<30%). End diastolic cavity size is severely enlarged. End systolic cavity size is severely enlarged.   Prior study not available for comparison.  Echocardiogram 10/29/2022:  1. Left ventricular ejection fraction, by estimation, is 30 to 35%. The left ventricle has moderately decreased function. The left ventricle demonstrates global hypokinesis. The left ventricular  internal cavity size was moderately to severely dilated. Left ventricular diastolic parameters are consistent with Grade II diastolic dysfunction (pseudonormalization). The average left ventricular global longitudinal strain is -14.1 %. The global longitudinal strain is abnormal. 2. Right ventricular systolic function is normal. The right ventricular size is normal. 3. Left atrial size was severely dilated. 4. The mitral valve is normal in structure. Trivial mitral valve regurgitation. No evidence of mitral stenosis. 5. The aortic valve is tricuspid. Aortic valve regurgitation is trivial.  Aortic valve sclerosis/calcification is present, without any evidence of aortic stenosis. 6. Pulmonic valve regurgitation is moderate. 7. The inferior vena cava is normal in size with greater than 50% respiratory variability, suggesting right atrial pressure of 3 mmHg.  CARDIAC CATHETERIZATION 11/19/2022   Narrative   Prox RCA to Mid RCA lesion is 100% stenosed.   Prox LAD lesion is 80% stenosed.   Ost Cx to Prox Cx lesion is 20% stenosed.   Prox LAD to Mid LAD lesion is 90% stenosed.   2nd Diag lesion is 50% stenosed.   Mid LAD lesion is 80% stenosed.   Severe, heavily calcified stenosis throughout the proximal LAD. There is a moderate caliber Diagonal branch arising from the mid LAD. Mild non-obstructive plaque in the proximal Circumflex Large dominant RCA with dense calcification throughout the proximal and mid vessel. The vessel could not be selectively engaged but non-selective angiography demonstrates chronic total occlusion of the proximal vessel. The distal RCA and the PDA fills from left to right collaterals. Elevation of right and left heart pressures.    Recommendations: He has complex two vessel CAD with heavily calcified severe stenosis throughout the proximal LAD. The RCA is totally occluded. While PCI of the LAD with orbital atherectomy would be an option, I think we need to consider bypass  surgery for complete revascularization.  EKG:    EKG 12/07/2022: Normal sinus rhythm at rate of 80 bpm, normal axis, no evidence of ischemia.  PVCs (2).  Compared to 11/13/2022, nonspecific inferolateral ST-T changes no longer present.  EKG 10/09/2022: Normal sinus rhythm at rate of 95 bpm, normal axis, frequent PVCs (4 including 1 ventricular couplet). Abnormal EKG. Compared to 03/28/2020, no significant change, there were 3 fusion complexes and 1 PVC.   Medications and allergies    No Known Allergies   Current Outpatient Medications:    albuterol (PROVENTIL) (2.5 MG/3ML) 0.083% nebulizer solution, Take 3 mLs (2.5 mg total) by nebulization every 6 (six) hours as needed for wheezing or shortness of breath., Disp: 75 mL, Rfl: 12   allopurinol (ZYLOPRIM) 300 MG tablet, Take 300 mg by mouth daily., Disp: , Rfl:    aspirin 81 MG tablet, Take 81 mg by mouth daily., Disp: , Rfl:    buPROPion (WELLBUTRIN XL) 150 MG 24 hr tablet, Take 1 tablet by mouth every morning., Disp: , Rfl:    esomeprazole (NEXIUM) 40 MG capsule, TAKE ONE CAPSULE BY MOUTH DAILY IN THE MORNING (Patient taking differently: Take 40 mg by mouth daily.), Disp: 30 capsule, Rfl: 0   furosemide (LASIX) 40 MG tablet, Take 1 tablet (40 mg total) by mouth daily., Disp: 90 tablet, Rfl: 3   glucosamine-chondroitin 500-400 MG tablet, Take 1 tablet by mouth daily., Disp: , Rfl:    losartan-hydrochlorothiazide (HYZAAR) 50-12.5 MG tablet, Take 1 tablet by mouth daily., Disp: 90 tablet, Rfl: 3   magnesium oxide (MAG-OX) 400 MG tablet, Take 1 tablet (400 mg total) by mouth daily., Disp: 30 tablet, Rfl: 3   metoprolol succinate (TOPROL XL) 25 MG 24 hr tablet, Take 1 tablet (25 mg total) by mouth daily., Disp: 90 tablet, Rfl: 3   Multiple Vitamins-Iron (MULTIVITAMINS WITH IRON) TABS, Take 1 tablet by mouth daily., Disp: , Rfl:    rosuvastatin (CRESTOR) 20 MG tablet, Take 1 tablet (20 mg total) by mouth daily., Disp: 90 tablet, Rfl: 3   albuterol  (PROVENTIL HFA;VENTOLIN HFA) 108 (90 Base) MCG/ACT inhaler, Inhale 1-2 puffs into the lungs every 6 (six) hours as needed  for wheezing or shortness of breath. (Patient not taking: Reported on 12/07/2022), Disp: 1 Inhaler, Rfl: 0   budesonide (PULMICORT) 0.5 MG/2ML nebulizer solution, INHALE TWO MILLILITERS VIA NEBULIZATION BY MOUTH TWICE A DAY (Patient not taking: Reported on 12/07/2022), Disp: 120 mL, Rfl: 0   fluticasone (FLONASE) 50 MCG/ACT nasal spray, Place 1 spray into both nostrils daily. (Patient not taking: Reported on 12/07/2022), Disp: 16 g, Rfl: 2   ASSESSMENT AND PLAN: .      ICD-10-CM   1. Coronary artery disease due to calcified coronary lesion  I25.10 EKG 12-Lead   I25.84 metoprolol succinate (TOPROL XL) 25 MG 24 hr tablet    2. Chronic HFrEF (heart failure with reduced ejection fraction) (HCC)  I50.22 metoprolol succinate (TOPROL XL) 25 MG 24 hr tablet    3. DOE (dyspnea on exertion)  R06.09     4. Hypercholesteremia  E78.00      1. Coronary artery disease due to calcified coronary lesion I have reviewed the results of the cardiac catheterization and manage patient has occluded RCA and heavily calcified LAD, consideration is for bypass versus angioplasty.  The question is whether the cardiomyopathy is related to excess alcohol, he was a heavy drinker which he has reduced alcohol drinking since he met me.  Would like to optimize his schedule for cardiac medications, we will start him on metoprolol succinate 25 mg daily, his blood pressure is very soft.  He is also on losartan HCT, for now continue the same and consider changing over to [facility].  2. Chronic HFrEF (heart failure with reduced ejection fraction) (HCC) Whether the HFrEF is related to alcohol or related to coronary disease is difficult to delineate.  Will consider CABG depending upon his pulmonary status as well.  Also optimizing his medical care and guideline directed medical therapy for HFrEF and rechecking his LVEF  would also be appropriate.   3. DOE (dyspnea on exertion) Dyspnea on exertion related to heavy tobacco use, although he quit 40 years ago, he started smoking at the age of 87.  He has an appointment coming up with Dr. Marchelle Gearing, depending upon PFTs and his recommendations will consider CABG versus PCI versus medical therapy.  Dyspnea on exertion is related to underlying COPD, CAD probably contributing.  No clinical evidence of acute decompensated heart failure.  4. Hypercholesteremia I had started him on Crestor which he is tolerating 20 mg daily, lipids are now at goal at less than 70 for LDL.  Continue the same.  Office visit in 6 weeks with me.  Will await on pulmonary evaluation.  Signed,  Yates Decamp, MD, [facility] 12/07/2022, 9:18 AM [facility] [address] [place], [place] [address] Phone: [phone]. Fax:  [phone]

## 2022-12-07 ENCOUNTER — Ambulatory Visit: Payer: Medicare PPO | Admitting: Cardiovascular Disease

## 2022-12-07 ENCOUNTER — Encounter: Payer: Self-pay | Admitting: Cardiology

## 2022-12-07 ENCOUNTER — Ambulatory Visit: Payer: Medicare PPO | Attending: Cardiology | Admitting: Cardiology

## 2022-12-07 VITALS — BP 118/72 | HR 86 | Resp 16 | Ht 71.0 in | Wt 226.0 lb

## 2022-12-07 DIAGNOSIS — E7800 Pure hypercholesterolemia, unspecified: Secondary | ICD-10-CM | POA: Diagnosis not present

## 2022-12-07 DIAGNOSIS — R0609 Other forms of dyspnea: Secondary | ICD-10-CM | POA: Diagnosis not present

## 2022-12-07 DIAGNOSIS — I2510 Atherosclerotic heart disease of native coronary artery without angina pectoris: Secondary | ICD-10-CM | POA: Diagnosis not present

## 2022-12-07 DIAGNOSIS — I2584 Coronary atherosclerosis due to calcified coronary lesion: Secondary | ICD-10-CM

## 2022-12-07 DIAGNOSIS — I5022 Chronic systolic (congestive) heart failure: Secondary | ICD-10-CM | POA: Diagnosis not present

## 2022-12-07 MED ORDER — METOPROLOL SUCCINATE ER 25 MG PO TB24: 25.0000 mg | ORAL_TABLET | Freq: Every day | ORAL | 3 refills | Status: DC

## 2022-12-07 NOTE — Patient Instructions (Addendum)
Medication Instructions:  Your physician has recommended you make the following change in your medication: Start metoprolol succinate 25 mg by mouth daily  *If you need a refill on your cardiac medications before your next appointment, please call your pharmacy*   Lab Work: none If you have labs (blood work) drawn today and your tests are completely normal, you will receive your results only by: MyChart Message (if you have MyChart) OR A paper copy in the mail If you have any lab test that is abnormal or we need to change your treatment, we will call you to review the results.   Testing/Procedures: none   Follow-Up: At [facility], you and your health needs are our priority.  As part of our continuing mission to provide you with exceptional heart care, we have created designated Provider Care Teams.  These Care Teams include your primary Cardiologist (physician) and Advanced Practice Providers (APPs -  Physician Assistants and Nurse Practitioners) who all work together to provide you with the care you need, when you need it.  We recommend signing up for the patient portal called "MyChart".  Sign up information is provided on this After Visit Summary.  MyChart is used to connect with patients for Virtual Visits (Telemedicine).  Patients are able to view lab/test results, encounter notes, upcoming appointments, etc.  Non-urgent messages can be sent to your provider as well.   To learn more about what you can do with MyChart, go to [email or web address].    Your next appointment:   January 21, 2023 at 12:00  Provider:   Yates Decamp, MD     Other Instructions Please reschedule Dr Tresa Endo appointment with Dr Mayford Knife  as Dr Tresa Endo is retiring

## 2022-12-10 ENCOUNTER — Ambulatory Visit: Payer: Medicare PPO | Admitting: Physician Assistant

## 2022-12-10 ENCOUNTER — Telehealth: Payer: Self-pay | Admitting: *Deleted

## 2022-12-10 ENCOUNTER — Encounter: Payer: Self-pay | Admitting: Cardiology

## 2022-12-10 NOTE — Telephone Encounter (Signed)
Med list updated

## 2022-12-15 NOTE — Progress Notes (Unsigned)
OV 12/16/2022  Subjective:  Patient ID: Jose Hines., male , DOB: 1945-02-29 , age 77 y.o. , MRN: [id number] , ADDRESS: [address] [place] [address] PCP Tally Joe, MD Patient Care Team: Tally Joe, MD as PCP - General (Family Medicine) Yates Decamp, MD as PCP - Cardiology (Cardiology)  This Provider for this visit: Treatment Team:  Attending Provider: Kalman Shan, MD    12/16/2022 -   Chief Complaint  Patient presents with   Consult    Sob,and cough and chest congestion. Symptoms started a few weeks ago with sinus drainage      HPI Jose Hines. 77 y.o. -referred by Dr. Jacinto Halim.  Dr. Jacinto Halim is concerned about presence of interstitial lung disease and also requires preoperative clearance prior to potential cardiac bypass.  Patient affirms understanding of the same.  He is a retired Psychologist, forensic.  He is to Social research officer, government at [employer], [place] in high and also [place].  He believes these buildings were all in the might have been mold but he does not recall any visible mold.  He tells me throughout his life especially from high school he always was noticed to be somebody who would often hugg puff but never actually feel short of breath.  But he says he is had perceivable shortness of breath for the last few to several years particularly several years.  However in the last 3 months has been a decline he is more short of breath.  He says he was short of breath just mowing the grass and his wife had to tell him to go visit with the physician.  And that is how he was diagnosed with coronary artery disease and chronic systolic dysfunction.   He has a history of sleep apnea.  He uses CPAP at night but not seen a sleep doctor for over 15 years.  Sleep doctors at [facility].  On 11/19/2022 Dr. Clifton James did left heart catheterization and he was deemed to have complex two-vessel coronary artery disease with heavily calcified severe stenosis with complete  occlusion of the RCA.  Bypass surgery is being preferred.  He also had a nuclear medicine stress test 10/20/2022 and his ejection fraction is 27%.  He says he does not have any chest pain with exertion.  Echo around [facility] 2024 showed EF 30%.  At this moment in time he is reporting significant dyspnea on exertion and decreased energy.  Dyspnea is at least class III if he finds dyspnea for changing clothes and even taking a shower.  There is also associated cough.  On exam he does have crackles especially Velcro crackles at the lung base..   Sit/stand test to mimic a stair climbing 12/16/2022    O2 used ra   Number laps completed Sit stand x 10   Comments about pace Study but below average   Resting Pulse Ox/HR 95% % and 63/min   Final Pulse Ox/HR 93% % and 86/min   Desaturated </= 88% No   Desaturated <= 3% points No   Got Tachycardic >/= 90/min Like to increase in heart rate but not greater than 90   Symptoms at end of test He was stably short of breath but denied shortness of breath is a symptom   Miscellaneous comments x       PFT      No data to display            LAB RESULTS last 96 hours No results  found.  LAB RESULTS last 90 days Recent Results (from the past 2160 hour(s))  MYOCARDIAL PERFUSION IMAGING     Status: None   Collection Time: 10/20/22 10:45 AM  Result Value Ref Range   Rest Nuclear Isotope Dose 10.2 mCi   Stress Nuclear Isotope Dose 31.2 mCi   Rest HR 80.0 bpm   Rest BP 112/80 mmHg   Peak HR 87 bpm   Peak BP 118/75 mmHg   SSS 7.0    SRS 4.0    SDS 3.0    TID 0.98    LV sys vol 162.0 mL   LV dias vol 221.0 62 - 150 mL   Nuc Stress EF 27 %   ST Depression (mm) 0 mm  ECHOCARDIOGRAM COMPLETE     Status: None   Collection Time: 10/29/22  9:06 AM  Result Value Ref Range   Area-P 1/2 3.87 cm2   S' Lateral 6.00 cm   MV M vel 4.34 m/s   MV Peak grad 75.3 mmHg   Est EF 30 - 35%   TSH     Status: None   Collection Time: 11/13/22  3:38 PM   Result Value Ref Range   TSH 1.610 0.450 - 4.500 uIU/mL  Comp Met (CMET)     Status: Abnormal   Collection Time: 11/13/22  3:39 PM  Result Value Ref Range   Glucose 146 (H) 70 - 99 mg/dL   BUN 12 8 - 27 mg/dL   Creatinine, Ser 1.61 0.76 - 1.27 mg/dL   eGFR 91 >09 UE/AVW/0.98   BUN/Creatinine Ratio 15 10 - 24   Sodium 140 134 - 144 mmol/L   Potassium 4.3 3.5 - 5.2 mmol/L   Chloride 102 96 - 106 mmol/L   CO2 22 20 - 29 mmol/L   Calcium 9.2 8.6 - 10.2 mg/dL   Total Protein 6.6 6.0 - 8.5 g/dL   Albumin 4.0 3.8 - 4.8 g/dL   Globulin, Total 2.6 1.5 - 4.5 g/dL   Bilirubin Total 0.2 0.0 - 1.2 mg/dL   Alkaline Phosphatase 94 44 - 121 IU/L   AST 31 0 - 40 IU/L   ALT 24 0 - 44 IU/L  Magnesium     Status: None   Collection Time: 11/13/22  3:39 PM  Result Value Ref Range   Magnesium 1.8 1.6 - 2.3 mg/dL  Pro b natriuretic peptide (BNP)     Status: None   Collection Time: 11/13/22  3:39 PM  Result Value Ref Range   NT-Pro BNP 400 0 - 486 pg/mL    Comment: The following cut-points have been suggested for the use of proBNP for the diagnostic evaluation of heart failure (HF) in patients with acute dyspnea: Modality                     Age           Optimal Cut                            (years)            Point ------------------------------------------------------ Diagnosis (rule in HF)        <50            450 pg/mL                           50 - 75  900 pg/mL                               >75           1800 pg/mL Exclusion (rule out HF)  Age independent     300 pg/mL   CBC     Status: Abnormal   Collection Time: 11/13/22  3:39 PM  Result Value Ref Range   WBC 11.0 (H) 3.4 - 10.8 x10E3/uL   RBC 3.69 (L) 4.14 - 5.80 x10E6/uL   Hemoglobin 13.0 13.0 - 17.7 g/dL   Hematocrit 32.4 40.1 - 51.0 %   MCV 104 (H) 79 - 97 fL   MCH 35.2 (H) 26.6 - 33.0 pg   MCHC 34.0 31.5 - 35.7 g/dL   RDW 02.7 25.3 - 66.4 %   Platelets 206 150 - 450 x10E3/uL  POCT I-Stat EG7     Status:  Abnormal   Collection Time: 11/19/22 12:34 PM  Result Value Ref Range   pH, Ven 7.393 7.25 - 7.43   pCO2, Ven 43.8 (L) 44 - 60 mmHg   pO2, Ven 33 32 - 45 mmHg   Bicarbonate 26.7 20.0 - 28.0 mmol/L   TCO2 28 22 - 32 mmol/L   O2 Saturation 64 %   Acid-Base Excess 1.0 0.0 - 2.0 mmol/L   Sodium 142 135 - 145 mmol/L   Potassium 3.5 3.5 - 5.1 mmol/L   Calcium, Ion 1.12 (L) 1.15 - 1.40 mmol/L   HCT 36.0 (L) 39.0 - 52.0 %   Hemoglobin 12.2 (L) 13.0 - 17.0 g/dL   Sample type VENOUS   I-STAT 7, (LYTES, BLD GAS, ICA, H+H)     Status: Abnormal   Collection Time: 11/19/22 12:34 PM  Result Value Ref Range   pH, Arterial 7.408 7.35 - 7.45   pCO2 arterial 40.5 32 - 48 mmHg   pO2, Arterial 61 (L) 83 - 108 mmHg   Bicarbonate 25.5 20.0 - 28.0 mmol/L   TCO2 27 22 - 32 mmol/L   O2 Saturation 91 %   Acid-Base Excess 1.0 0.0 - 2.0 mmol/L   Sodium 140 135 - 145 mmol/L   Potassium 3.7 3.5 - 5.1 mmol/L   Calcium, Ion 1.19 1.15 - 1.40 mmol/L   HCT 38.0 (L) 39.0 - 52.0 %   Hemoglobin 12.9 (L) 13.0 - 17.0 g/dL   Sample type ARTERIAL   Basic metabolic panel     Status: Abnormal   Collection Time: 11/27/22  4:19 PM  Result Value Ref Range   Glucose 150 (H) 70 - 99 mg/dL   BUN 13 8 - 27 mg/dL   Creatinine, Ser 4.03 0.76 - 1.27 mg/dL   eGFR 92 >47 QQ/VZD/6.38   BUN/Creatinine Ratio 17 10 - 24   Sodium 140 134 - 144 mmol/L   Potassium 4.2 3.5 - 5.2 mmol/L   Chloride 97 96 - 106 mmol/L   CO2 27 20 - 29 mmol/L   Calcium 9.7 8.6 - 10.2 mg/dL  Lipid panel     Status: Abnormal   Collection Time: 11/27/22  4:19 PM  Result Value Ref Range   Cholesterol, Total 137 100 - 199 mg/dL   Triglycerides 756 (H) 0 - 149 mg/dL   HDL 46 >43 mg/dL   VLDL Cholesterol Cal 29 5 - 40 mg/dL   LDL Chol Calc (NIH) 62 0 - 99 mg/dL   Chol/HDL Ratio 3.0 0.0 - 5.0 ratio  Comment:                                   T. Chol/HDL Ratio                                             Men  Women                               1/2  Avg.Risk  3.4    3.3                                   Avg.Risk  5.0    4.4                                2X Avg.Risk  9.6    7.1                                3X Avg.Risk 23.4   11.0          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), Alcoholic polyneuropathy (HCC), Allergy, Asthma, BPH (benign prostatic hyperplasia), COPD (chronic obstructive pulmonary disease) (HCC), Depression, Depression, Diverticulosis, Dyspnea on exertion, Elevated LDL cholesterol level, GERD with stricture, Gout, Hiatal hernia, Hypertension, Kidney stones, Lumbar and sacral osteoarthritis, Peripheral polyneuropathy, Sleep apnea, and Wears hearing aid.   reports that he quit smoking about 54 years ago. His smoking use included cigarettes. He started smoking about 64 years ago. He has a 20 pack-year smoking history. He has never used smokeless tobacco.  Past Surgical History:  Procedure Laterality Date   COLONOSCOPY     INGUINAL HERNIA REPAIR     POLYPECTOMY     RIGHT/LEFT HEART CATH AND CORONARY ANGIOGRAPHY N/A 11/19/2022   Procedure: RIGHT/LEFT HEART CATH AND CORONARY ANGIOGRAPHY;  Surgeon: Kathleene Hazel, MD;  Location: [facility];  Service: Cardiovascular;  Laterality: N/A;   TONSILLECTOMY AND ADENOIDECTOMY      No Known Allergies  Immunization History  Administered Date(s) Administered   Influenza Split 10/16/2016   Influenza, High Dose Seasonal PF 10/17/2017, 09/30/2018   Tdap 12/24/2016    Family History  Problem Relation Age of Onset   Transient ischemic attack Mother    Heart disease Father 78   Other Son        car wreck   Colon cancer Neg Hx    Esophageal cancer Neg Hx    Rectal cancer Neg Hx    Stomach cancer Neg Hx      Current Outpatient Medications:    albuterol (PROVENTIL) (2.5 MG/3ML) 0.083% nebulizer solution, Take 3 mLs (2.5 mg total) by nebulization every 6 (six) hours as needed for wheezing or shortness of breath., Disp: 75 mL, Rfl:  12   allopurinol (ZYLOPRIM) 300 MG tablet, Take 300 mg by mouth daily., Disp: , Rfl:    aspirin 81 MG tablet, Take 81 mg by mouth daily., Disp: , Rfl:    buPROPion (WELLBUTRIN XL) 150 MG 24 hr tablet, Take 1 tablet by mouth every morning.,  Disp: , Rfl:    esomeprazole (NEXIUM) 40 MG capsule, TAKE ONE CAPSULE BY MOUTH DAILY IN THE MORNING (Patient taking differently: Take 40 mg by mouth daily.), Disp: 30 capsule, Rfl: 0   furosemide (LASIX) 40 MG tablet, Take 1 tablet (40 mg total) by mouth daily., Disp: 90 tablet, Rfl: 3   Glucosamine HCl 1500 MG TABS, Take 1 tablet by mouth daily., Disp: , Rfl:    losartan-hydrochlorothiazide (HYZAAR) 50-12.5 MG tablet, Take 1 tablet by mouth daily., Disp: 90 tablet, Rfl: 3   magnesium oxide (MAG-OX) 400 MG tablet, Take 1 tablet (400 mg total) by mouth daily., Disp: 30 tablet, Rfl: 3   metoprolol succinate (TOPROL XL) 25 MG 24 hr tablet, Take 1 tablet (25 mg total) by mouth daily., Disp: 90 tablet, Rfl: 3   Multiple Vitamins-Iron (MULTIVITAMINS WITH IRON) TABS, Take 1 tablet by mouth daily., Disp: , Rfl:    rosuvastatin (CRESTOR) 20 MG tablet, Take 1 tablet (20 mg total) by mouth daily., Disp: 90 tablet, Rfl: 3   albuterol (PROVENTIL HFA;VENTOLIN HFA) 108 (90 Base) MCG/ACT inhaler, Inhale 1-2 puffs into the lungs every 6 (six) hours as needed for wheezing or shortness of breath. (Patient not taking: Reported on 12/07/2022), Disp: 1 Inhaler, Rfl: 0   budesonide (PULMICORT) 0.5 MG/2ML nebulizer solution, INHALE TWO MILLILITERS VIA NEBULIZATION BY MOUTH TWICE A DAY (Patient not taking: Reported on 12/07/2022), Disp: 120 mL, Rfl: 0   fluticasone (FLONASE) 50 MCG/ACT nasal spray, Place 1 spray into both nostrils daily. (Patient not taking: Reported on 12/07/2022), Disp: 16 g, Rfl: 2   glucosamine-chondroitin 500-400 MG tablet, Take 1 tablet by mouth daily. (Patient not taking: Reported on 12/10/2022), Disp: , Rfl:       Objective:   Vitals:   12/16/22 1553  BP: 111/73   Pulse: (!) 53  SpO2: 97%  Weight: 224 lb 6.4 oz (101.8 kg)  Height: 6' (1.829 m)    Estimated body mass index is 30.43 kg/m as calculated from the following:   Height as of this encounter: 6' (1.829 m).   Weight as of this encounter: 224 lb 6.4 oz (101.8 kg).  @WEIGHTCHANGE @  [employer]   12/16/22 1553  Weight: 224 lb 6.4 oz (101.8 kg)     Physical Exam   General: No distress. Looks well O2 at rest: no Cane present: no Sitting in wheel chair: no Frail: no Obese: no Neuro: Alert and Oriented x 3. GCS 15. Speech normal Psych: Pleasant Resp:  Barrel Chest - no.  Wheeze - no, Crackles -bilateral lower lobe right greater than left Velcro crackles, No overt respiratory distress CVS: Normal heart sounds. Murmurs - no Ext: Stigmata of Connective Tissue Disease - no but has possible clubbing HEENT: Normal upper airway. PEERL +. No post nasal drip        Assessment:       ICD-10-CM   1. DOE (dyspnea on exertion)  R06.09 Pulmonary function test    CT Chest High Resolution    Antinuclear Antib (ANA)    Rheumatoid factor    Cyclic citrul peptide antibody, IgG    Quantiferon tb gold assay    Hypersensitivity pnuemonitis profile    CBC w/Diff    IgE    2. Chronic cough  R05.3 Pulmonary function test    CT Chest High Resolution    Antinuclear Antib (ANA)    Rheumatoid factor    Cyclic citrul peptide antibody, IgG    Quantiferon tb gold assay  Hypersensitivity pnuemonitis profile    CBC w/Diff    IgE    3. Bibasilar crackles  R09.89 Pulmonary function test    CT Chest High Resolution    Antinuclear Antib (ANA)    Rheumatoid factor    Cyclic citrul peptide antibody, IgG    Quantiferon tb gold assay    Hypersensitivity pnuemonitis profile    CBC w/Diff    IgE    4. Chronic systolic heart failure (HCC)  W29.56 Pulmonary function test    CT Chest High Resolution    Antinuclear Antib (ANA)    Rheumatoid factor    Cyclic citrul peptide antibody, IgG     Quantiferon tb gold assay    Hypersensitivity pnuemonitis profile    CBC w/Diff    IgE    5. History of CAD (coronary artery disease)  Z86.79 Pulmonary function test    CT Chest High Resolution    Antinuclear Antib (ANA)    Rheumatoid factor    Cyclic citrul peptide antibody, IgG    Quantiferon tb gold assay    Hypersensitivity pnuemonitis profile    CBC w/Diff    IgE    6. 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  However the imminent question right now is safety for cardiac bypass.  The fact that he was able to sit stand without desaturating is reassuring.  Will make a formal determination once he gets his pulmonary function test and to some serology profile.  At some point we will not administer the ILD questionnaire.     Plan:     Patient Instructions     ICD-10-CM   1. DOE (dyspnea on exertion)  R06.09 Pulmonary function test    CT Chest High Resolution    Antinuclear Antib (ANA)    Rheumatoid factor    Cyclic citrul peptide antibody, IgG    Quantiferon tb gold assay    Hypersensitivity pnuemonitis profile    CBC w/Diff    IgE    2. Chronic cough  R05.3 Pulmonary function test    CT Chest High Resolution    Antinuclear Antib (ANA)    Rheumatoid factor    Cyclic citrul peptide antibody, IgG    Quantiferon tb gold assay    Hypersensitivity pnuemonitis profile    CBC w/Diff    IgE    3. Bibasilar crackles  R09.89 Pulmonary function test    CT Chest High Resolution    Antinuclear Antib (ANA)    Rheumatoid factor    Cyclic citrul peptide antibody, IgG     Quantiferon tb gold assay    Hypersensitivity pnuemonitis profile    CBC w/Diff    IgE    4. Chronic systolic heart failure (HCC)  O13.08 Pulmonary function test    CT Chest High Resolution    Antinuclear Antib (ANA)    Rheumatoid factor    Cyclic citrul peptide antibody, IgG    Quantiferon tb gold assay    Hypersensitivity pnuemonitis profile    CBC w/Diff    IgE  5. History of CAD (coronary artery disease)  Z86.79 Pulmonary function test    CT Chest High Resolution    Antinuclear Antib (ANA)    Rheumatoid factor    Cyclic citrul peptide antibody, IgG    Quantiferon tb gold assay    Hypersensitivity pnuemonitis profile    CBC w/Diff    IgE    6. 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  I base this based on his symptoms of shortness of breath and cough and also crackles on the exam, age greater than 58 and the progressive nature of his symptoms.  If you have this condition then it has an impact for preoperative fitness prior to potential bypass surgery  The fact you are able to sit and stand 10 times without dropping oxygen is somewhat reassuring in terms of being able to have a bypass surgery  Plan - Do high-resolution CT chest within the next week - Do spirometry and DLCO [try to get the December 28, 2022 appointment] - Do blood work for autoimmune panel QuantiFERON gold and hypersensitivity pneumonitis panel  Follow-up - Return within 2 or 3 weeks to see Dr. Marchelle Gearing  -Dr. Jane Canary opening schedule January 22, 2023 and also January 25, 2023 (try to get on this for a 30-minute  visit]   FOLLOWUP Return for 15 min visit, after [facility],, after HRCT  with Dr Marchelle Gearing - Jan 17 or JAn 20 or earlier .    SIGNATURE    Dr. Kalman Shan, M.D., F.C.C.P,  Pulmonary and Critical Care Medicine Staff Physician, [facility] Director - Interstitial Lung Disease  Program  Pulmonary Fibrosis [facility] at [facility] [place], [place], [address]  Pager: [phone], If no answer or between  15:00h - 7:00h: call [phone] Telephone: [phone]  4:38 PM 12/16/2022   Moderate Complexity MDM OFFICE  2021 E/M guidelines, first released in 2021, with minor revisions added in 2023 and 2024 Must meet the requirements for 2 out of 3 dimensions to qualify.    Number and complexity of problems addressed Amount and/or complexity of data reviewed Risk of complications and/or morbidity  One or more chronic illness with mild exacerbation, OR progression, OR  side effects of treatment  Two or more stable chronic illnesses  One undiagnosed new problem with uncertain prognosis  One acute illness with systemic symptoms   One Acute complicated injury Must meet the requirements for 1 of 3 of the categories)  Category 1: Tests and documents, historian  Any combination of 3 of the following:  Assessment requiring an independent historian  Review of prior external note(s) from each unique source  Review of results of each unique test  Ordering of each unique test    Category 2: Interpretation of tests   Independent interpretation of a test performed by another physician/other qualified health care professional (not separately reported)  Category 3: Discuss management/tests  Discussion of management or test interpretation with external physician/other qualified health care professional/appropriate source (not separately  reported) Moderate risk of morbidity from additional diagnostic testing or  treatment Examples only:  Prescription drug management  Decision regarding minor surgery with identfied patient or procedure risk factors  Decision regarding elective major surgery without identified patient or procedure risk factors  Diagnosis or treatment significantly limited by social determinants of health             HIGh Complexity  OFFICE   2021 E/M guidelines, first released in 2021, with minor revisions added in 2023. Must meet the requirements for 2 out of 3 dimensions to qualify.    Number and complexity of problems addressed Amount and/or complexity of data reviewed Risk of complications and/or morbidity  Severe exacerbation of chronic illness  Acute or chronic illnesses that may pose a threat to life or bodily function, e.g., multiple trauma, acute MI, pulmonary embolus, severe respiratory distress, progressive rheumatoid arthritis, psychiatric illness with potential threat to self or others, peritonitis, acute renal failure, abrupt change in neurological status Must meet the requirements for 2 of 3 of the categories)  Category 1: Tests and documents, historian  Any combination of 3 of the following:  Assessment requiring an independent historian  Review of prior external note(s) from each unique source  Review of results of each unique test  Ordering of each unique test    Category 2: Interpretation of tests    Independent interpretation of a test performed by another physician/other qualified health care professional (not separately reported)  Category 3: Discuss management/tests  Discussion of management or test interpretation with external physician/other qualified health care professional/appropriate source (not separately reported)  HIGH risk of morbidity from additional diagnostic testing or treatment Examples only:  Drug therapy requiring intensive monitoring for toxicity  Decision for elective major surgery with identified pateint or  procedure risk factors  Decision regarding hospitalization or escalation of level of care  Decision for DNR or to de-escalate care   Parenteral controlled  substances            LEGEND - Independent interpretation involves the interpretation of a test for which there is a CPT code, and an interpretation or report is customary. When a review and interpretation of a test is performed and documented by the provider, but not separately reported (billed), then this would represent an independent interpretation. This report does not need to conform to the usual standards of a complete report of the test. This does not include interpretation of tests that do not have formal reports such as a complete blood count with differential and blood cultures. Examples would include reviewing a chest radiograph and documenting in the medical record an interpretation, but not separately reporting (billing) the interpretation of the chest radiograph.   An appropriate source includes professionals who are not health care professionals but may be involved in the management of the patient, such as a Clinical research associate, upper officer, case manager or teacher, and does not include discussion with family or informal caregivers.    - SDOH: SDOH are the conditions in the environments where people are born, live, learn, work, play, worship, and age that affect a wide range of health, functioning, and quality-of-life outcomes and risks. (e.g., housing, food insecurity, transportation, etc.). SDOH-related Z codes ranging from Z55-Z65 are the ICD-10-CM diagnosis codes used to document SDOH data Z55 - Problems related to education and literacy Z56 - Problems related to employment and unemployment Z57 - Occupational exposure to risk factors Z58 - Problems related to physical environment Z59 - Problems related to  housing and economic circumstances [phone] - Problems related to social environment [phone] - Problems related to  upbringing [phone] - Other problems related to primary support group, including family circumstances Z52 - Problems related to certain psychosocial circumstances Z65 - Problems related to other psychosocial circumstances

## 2022-12-16 ENCOUNTER — Encounter: Payer: Self-pay | Admitting: Internal Medicine

## 2022-12-16 ENCOUNTER — Ambulatory Visit: Payer: Medicare PPO | Admitting: Internal Medicine

## 2022-12-16 VITALS — BP 111/73 | HR 53 | Ht 72.0 in | Wt 224.4 lb

## 2022-12-16 DIAGNOSIS — Z01811 Encounter for preprocedural respiratory examination: Secondary | ICD-10-CM | POA: Diagnosis not present

## 2022-12-16 DIAGNOSIS — R0989 Other specified symptoms and signs involving the circulatory and respiratory systems: Secondary | ICD-10-CM | POA: Diagnosis not present

## 2022-12-16 DIAGNOSIS — Z8679 Personal history of other diseases of the circulatory system: Secondary | ICD-10-CM | POA: Diagnosis not present

## 2022-12-16 DIAGNOSIS — I5022 Chronic systolic (congestive) heart failure: Secondary | ICD-10-CM

## 2022-12-16 DIAGNOSIS — R053 Chronic cough: Secondary | ICD-10-CM | POA: Diagnosis not present

## 2022-12-16 DIAGNOSIS — R0609 Other forms of dyspnea: Secondary | ICD-10-CM

## 2022-12-16 NOTE — Patient Instructions (Addendum)
ICD-10-CM   1. DOE (dyspnea on exertion)  R06.09 Pulmonary function test    CT Chest High Resolution    Antinuclear Antib (ANA)    Rheumatoid factor    Cyclic citrul peptide antibody, IgG    Quantiferon tb gold assay    Hypersensitivity pnuemonitis profile    CBC w/Diff    IgE    2. Chronic cough  R05.3 Pulmonary function test    CT Chest High Resolution    Antinuclear Antib (ANA)    Rheumatoid factor    Cyclic citrul peptide antibody, IgG    Quantiferon tb gold assay    Hypersensitivity pnuemonitis profile    CBC w/Diff    IgE    3. Bibasilar crackles  R09.89 Pulmonary function test    CT Chest High Resolution    Antinuclear Antib (ANA)    Rheumatoid factor    Cyclic citrul peptide antibody, IgG    Quantiferon tb gold assay    Hypersensitivity pnuemonitis profile    CBC w/Diff    IgE    4. Chronic systolic heart failure (HCC)  W09.81 Pulmonary function test    CT Chest High Resolution    Antinuclear Antib (ANA)    Rheumatoid factor    Cyclic citrul peptide antibody, IgG    Quantiferon tb gold assay    Hypersensitivity pnuemonitis profile    CBC w/Diff    IgE    5. History of CAD (coronary artery disease)  Z86.79 Pulmonary function test    CT Chest High Resolution    Antinuclear Antib (ANA)    Rheumatoid factor    Cyclic citrul peptide antibody, IgG    Quantiferon tb gold assay    Hypersensitivity pnuemonitis profile    CBC w/Diff    IgE    6. 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  I base this based on his symptoms of shortness of breath and cough and also crackles on the  exam, age greater than 70 and the progressive nature of his symptoms.  If you have this condition then it has an impact for preoperative fitness prior to potential bypass surgery  The fact you are able to sit and stand 10 times without dropping oxygen is somewhat reassuring in terms of being able to have a bypass surgery  Plan - Do high-resolution CT chest within the next week - Do spirometry and DLCO [try to get the December 28, 2022 appointment] - Do blood work for autoimmune panel QuantiFERON gold and hypersensitivity pneumonitis panel  Follow-up - Return within 2 or 3 weeks to see Dr. Marchelle Gearing  -Dr. Jane Canary opening schedule January 22, 2023 and also January 25, 2023 (try to get on this for a 30-minute visit]

## 2022-12-21 ENCOUNTER — Other Ambulatory Visit (INDEPENDENT_AMBULATORY_CARE_PROVIDER_SITE_OTHER): Payer: Medicare PPO

## 2022-12-21 DIAGNOSIS — R053 Chronic cough: Secondary | ICD-10-CM | POA: Diagnosis not present

## 2022-12-21 DIAGNOSIS — R0609 Other forms of dyspnea: Secondary | ICD-10-CM | POA: Diagnosis not present

## 2022-12-21 DIAGNOSIS — Z8679 Personal history of other diseases of the circulatory system: Secondary | ICD-10-CM | POA: Diagnosis not present

## 2022-12-21 DIAGNOSIS — R0989 Other specified symptoms and signs involving the circulatory and respiratory systems: Secondary | ICD-10-CM | POA: Diagnosis not present

## 2022-12-21 DIAGNOSIS — Z01811 Encounter for preprocedural respiratory examination: Secondary | ICD-10-CM

## 2022-12-21 DIAGNOSIS — I5022 Chronic systolic (congestive) heart failure: Secondary | ICD-10-CM

## 2022-12-21 LAB — CBC WITH DIFFERENTIAL/PLATELET
Basophils Absolute: 0.1 10*3/uL (ref 0.0–0.1)
Basophils Relative: 0.5 % (ref 0.0–3.0)
Eosinophils Absolute: 1.1 10*3/uL — ABNORMAL HIGH (ref 0.0–0.7)
Eosinophils Relative: 10.5 % — ABNORMAL HIGH (ref 0.0–5.0)
HCT: 43.8 % (ref 39.0–52.0)
Hemoglobin: 15 g/dL (ref 13.0–17.0)
Lymphocytes Relative: 25.2 % (ref 12.0–46.0)
Lymphs Abs: 2.6 10*3/uL (ref 0.7–4.0)
MCHC: 34.2 g/dL (ref 30.0–36.0)
MCV: 103.9 fL — ABNORMAL HIGH (ref 78.0–100.0)
Monocytes Absolute: 0.9 10*3/uL (ref 0.1–1.0)
Monocytes Relative: 8.8 % (ref 3.0–12.0)
Neutro Abs: 5.8 10*3/uL (ref 1.4–7.7)
Neutrophils Relative %: 55 % (ref 43.0–77.0)
Platelets: 263 10*3/uL (ref 150.0–400.0)
RBC: 4.21 Mil/uL — ABNORMAL LOW (ref 4.22–5.81)
RDW: 13.8 % (ref 11.5–15.5)
WBC: 10.5 10*3/uL (ref 4.0–10.5)

## 2022-12-22 LAB — BASIC METABOLIC PANEL
BUN/Creatinine Ratio: 18 (ref 10–24)
BUN: 14 mg/dL (ref 8–27)
CO2: 24 mmol/L (ref 20–29)
Calcium: 9.6 mg/dL (ref 8.6–10.2)
Chloride: 99 mmol/L (ref 96–106)
Creatinine, Ser: 0.8 mg/dL (ref 0.76–1.27)
Glucose: 150 mg/dL — ABNORMAL HIGH (ref 70–99)
Potassium: 4.1 mmol/L (ref 3.5–5.2)
Sodium: 140 mmol/L (ref 134–144)
eGFR: 91 mL/min/{1.73_m2} (ref >59)

## 2022-12-22 LAB — SPECIMEN STATUS REPORT

## 2022-12-24 DIAGNOSIS — J209 Acute bronchitis, unspecified: Secondary | ICD-10-CM | POA: Diagnosis not present

## 2022-12-24 DIAGNOSIS — F3341 Major depressive disorder, recurrent, in partial remission: Secondary | ICD-10-CM | POA: Diagnosis not present

## 2022-12-28 ENCOUNTER — Ambulatory Visit: Payer: Medicare PPO | Admitting: Internal Medicine

## 2022-12-28 DIAGNOSIS — R0609 Other forms of dyspnea: Secondary | ICD-10-CM

## 2022-12-28 DIAGNOSIS — R053 Chronic cough: Secondary | ICD-10-CM

## 2022-12-28 DIAGNOSIS — R0989 Other specified symptoms and signs involving the circulatory and respiratory systems: Secondary | ICD-10-CM

## 2022-12-28 DIAGNOSIS — Z8679 Personal history of other diseases of the circulatory system: Secondary | ICD-10-CM

## 2022-12-28 DIAGNOSIS — I5022 Chronic systolic (congestive) heart failure: Secondary | ICD-10-CM

## 2022-12-28 DIAGNOSIS — Z01811 Encounter for preprocedural respiratory examination: Secondary | ICD-10-CM

## 2022-12-28 LAB — ANA: Anti Nuclear Antibody (ANA): NEGATIVE

## 2022-12-28 LAB — PULMONARY FUNCTION TEST
DL/VA % pred: 103 %
DL/VA: 4.02 ml/min/mmHg/L
DLCO cor % pred: 56 %
DLCO cor: 14.76 ml/min/mmHg
DLCO unc % pred: 56 %
DLCO unc: 14.92 ml/min/mmHg
FEF 25-75 Pre: 0.61 L/s
FEF2575-%Pred-Pre: 27 %
FEV1-%Pred-Pre: 41 %
FEV1-Pre: 1.34 L
FEV1FVC-%Pred-Pre: 75 %
FEV6-%Pred-Pre: 59 %
FEV6-Pre: 2.48 L
FEV6FVC-%Pred-Pre: 106 %
FVC-%Pred-Pre: 55 %
FVC-Pre: 2.49 L
Pre FEV1/FVC ratio: 54 %
Pre FEV6/FVC Ratio: 100 %

## 2022-12-28 LAB — HYPERSENSITIVITY PNUEMONITIS PROFILE
ASPERGILLUS FUMIGATUS: NEGATIVE
Faenia retivirgula: NEGATIVE
Pigeon Serum: NEGATIVE
S. VIRIDIS: NEGATIVE
T. CANDIDUS: NEGATIVE
T. VULGARIS: NEGATIVE

## 2022-12-28 LAB — IGE: IgE (Immunoglobulin E), Serum: 13 kU/L (ref <=114)

## 2022-12-28 LAB — RHEUMATOID FACTOR: Rheumatoid fact SerPl-aCnc: <10 [IU]/mL (ref <14)

## 2022-12-28 LAB — CYCLIC CITRUL PEPTIDE ANTIBODY, IGG: Cyclic Citrullin Peptide Ab: <16 U

## 2022-12-28 NOTE — Patient Instructions (Addendum)
Spirometry/DLCO performed today. 

## 2022-12-28 NOTE — Progress Notes (Signed)
Spirometry/DLCO performed today. 

## 2022-12-31 ENCOUNTER — Ambulatory Visit
Admission: RE | Admit: 2022-12-31 | Discharge: 2022-12-31 | Disposition: A | Discharge status: Home / Self Care | Payer: Medicare PPO | Source: Ambulatory Visit | Attending: Internal Medicine | Admitting: Internal Medicine

## 2022-12-31 DIAGNOSIS — I5022 Chronic systolic (congestive) heart failure: Secondary | ICD-10-CM

## 2022-12-31 DIAGNOSIS — J8410 Pulmonary fibrosis, unspecified: Secondary | ICD-10-CM | POA: Diagnosis not present

## 2022-12-31 DIAGNOSIS — I700 Atherosclerosis of aorta: Secondary | ICD-10-CM | POA: Diagnosis not present

## 2022-12-31 DIAGNOSIS — R0609 Other forms of dyspnea: Secondary | ICD-10-CM

## 2022-12-31 DIAGNOSIS — Z01811 Encounter for preprocedural respiratory examination: Secondary | ICD-10-CM

## 2022-12-31 DIAGNOSIS — R0989 Other specified symptoms and signs involving the circulatory and respiratory systems: Secondary | ICD-10-CM

## 2022-12-31 DIAGNOSIS — R053 Chronic cough: Secondary | ICD-10-CM

## 2022-12-31 DIAGNOSIS — Z8679 Personal history of other diseases of the circulatory system: Secondary | ICD-10-CM

## 2023-01-04 DIAGNOSIS — J209 Acute bronchitis, unspecified: Secondary | ICD-10-CM | POA: Diagnosis not present

## 2023-01-12 ENCOUNTER — Encounter: Payer: Self-pay | Admitting: Cardiology

## 2023-01-12 ENCOUNTER — Ambulatory Visit: Payer: Medicare PPO | Attending: Cardiology | Admitting: Cardiology

## 2023-01-12 VITALS — BP 112/70 | HR 65 | Ht 71.0 in | Wt 221.2 lb

## 2023-01-12 DIAGNOSIS — I10 Essential (primary) hypertension: Secondary | ICD-10-CM

## 2023-01-12 DIAGNOSIS — G4733 Obstructive sleep apnea (adult) (pediatric): Secondary | ICD-10-CM | POA: Diagnosis not present

## 2023-01-12 NOTE — Patient Instructions (Signed)
 Medication Instructions:  Your physician recommends that you continue on your current medications as directed. Please refer to the Current Medication list given to you today.  *If you need a refill on your cardiac medications before your next appointment, please call your pharmacy*   Lab Work: None.  If you have labs (blood work) drawn today and your tests are completely normal, you will receive your results only by: MyChart Message (if you have MyChart) OR A paper copy in the mail If you have any lab test that is abnormal or we need to change your treatment, we will call you to review the results.   Testing/Procedures: Your physician has recommended that you have an in-lab split night sleep study. This test records several body functions during sleep, including: brain activity, eye movement, oxygen and carbon dioxide blood levels, heart rate and rhythm, breathing rate and rhythm, the flow of air through your mouth and nose, snoring, body muscle movements, and chest and belly movement.    Follow-Up:   Your next appointment will be dependent on the results of your testing and it will be with:     Provider:   Dr. Wilbert Bihari, MD

## 2023-01-12 NOTE — Progress Notes (Signed)
 Sleep Medicine CONSULT Note    Date:  01/12/2023   ID:  Jose MALVA Con Mickey., DOB 01-Apr-1945, MRN [id number]  PCP:  Seabron Lenis, MD  Cardiologist: Gordy Bergamo, MD   Chief Complaint  Patient presents with   New Patient (Initial Visit)    Obstructive sleep apnea    History of Present Illness:  Jose Jerusalem Wert. is a 78 y.o. male who is being seen today for the evaluation of OSA at the request of Gordy Bergamo, MD.  This is a 78 year old male with a history of asthma, shortness of breath, GERD, hypertension, hiatal hernia and sleep apnea on CPAP.  He apparently had been treated for sleep apnea in [place] but has not seen a sleep doctor in years.  I do not have any records on him at this time.  He is doing well with his PAP device but needs a new travel device (he only uses a travel CPAP).  He tolerates the nasal pillow mask and feels the pressure is adequate. He does not know what his pressure setting is.  Since going on PAP he feels rested in the am .  Occasionally  he will nap during the day but no significant daytime sleepiness. Jose Hines  He denies any significant mouth or nasal dryness or nasal congestion.  He does not think that he snores.    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   Alcoholic polyneuropathy (HCC)    Allergy    Asthma    BPH (benign prostatic hyperplasia)    COPD (chronic obstructive pulmonary disease) (HCC)    Depression    Depression    Diverticulosis    Dyspnea on exertion    Elevated LDL cholesterol level    GERD with stricture    Gout    Hiatal hernia    Hypertension    Kidney stones    2 times - passed stones, no surgery required   Lumbar and sacral osteoarthritis    Peripheral polyneuropathy    Sleep apnea    wears CPAP   Wears hearing aid    Bil    Past Surgical History:  Procedure Laterality Date   COLONOSCOPY     INGUINAL HERNIA REPAIR     POLYPECTOMY     RIGHT/LEFT HEART CATH AND CORONARY ANGIOGRAPHY N/A  11/19/2022   Procedure: RIGHT/LEFT HEART CATH AND CORONARY ANGIOGRAPHY;  Surgeon: Verlin Lonni BIRCH, MD;  Location: [facility];  Service: Cardiovascular;  Laterality: N/A;   TONSILLECTOMY AND ADENOIDECTOMY      Current Medications: Current Meds  Medication Sig   albuterol  (PROVENTIL ) (2.5 MG/3ML) 0.083% nebulizer solution Take 3 mLs (2.5 mg total) by nebulization every 6 (six) hours as needed for wheezing or shortness of breath.   allopurinol  (ZYLOPRIM ) 300 MG tablet Take 300 mg by mouth daily.   aspirin  81 MG tablet Take 81 mg by mouth daily.   buPROPion  (WELLBUTRIN  XL) 150 MG 24 hr tablet Take 1 tablet by mouth every morning.   esomeprazole  (NEXIUM ) 40 MG capsule TAKE ONE CAPSULE BY MOUTH DAILY IN THE MORNING (Patient taking differently: Take 40 mg by mouth daily.)   furosemide  (LASIX ) 40 MG tablet Take 1 tablet (40 mg total) by mouth daily.   Glucosamine HCl 1500 MG TABS Take 1 tablet by mouth daily.   losartan -hydrochlorothiazide (HYZAAR) 50-12.5 MG tablet Take 1 tablet by mouth daily.   magnesium  oxide (MAG-OX) 400 MG tablet Take 1 tablet (  400 mg total) by mouth daily.   metoprolol  succinate (TOPROL  XL) 25 MG 24 hr tablet Take 1 tablet (25 mg total) by mouth daily.   Multiple Vitamins-Iron (MULTIVITAMINS WITH IRON) TABS Take 1 tablet by mouth daily.   rosuvastatin  (CRESTOR ) 20 MG tablet Take 1 tablet (20 mg total) by mouth daily.    Allergies:   Patient has no known allergies.   Social History   Socioeconomic History   Marital status: Married    Spouse name: Not on file   Number of children: 2   Years of education: Not on file   Highest education level: Not on file  Occupational History   Occupation: Engineer, Site  Tobacco Use   Smoking status: Former    Current packs/day: 0.00    Average packs/day: 2.0 packs/day for 10.0 years (20.0 ttl pk-yrs)    Types: Cigarettes    Start date: 12/29/1958    Quit date: 12/28/1968    Years since quitting: 54.0    Smokeless tobacco: Never  Vaping Use   Vaping status: Never Used  Substance and Sexual Activity   Alcohol  use: Yes    Alcohol /week: 7.0 - 14.0 standard drinks of alcohol     Types: 7 - 14 Standard drinks or equivalent per week    Comment: bourbon   Drug use: No   Sexual activity: Not on file  Other Topics Concern   Not on file  Social History Narrative   Not on file   Social Drivers of Health   Financial Resource Strain: Not on file  Food Insecurity: Not on file  Transportation Needs: Not on file  Physical Activity: Not on file  Stress: Not on file  Social Connections: Not on file     Family History:  The patient's family history includes Heart disease (age of onset: 31) in his father; Other in his son; Transient ischemic attack in his mother.   ROS:   Please see the history of present illness.    ROS All other systems reviewed and are negative.      No data to display             PHYSICAL EXAM:   VS:  BP 112/70   Pulse 65   Ht 5' 11 (1.803 m)   Wt 221 lb 3.2 oz (100.3 kg)   SpO2 95%   BMI 30.85 kg/m    GEN: Well nourished, well developed, in no acute distress  HEENT: normal  Neck: no JVD, carotid bruits, or masses Cardiac: RRR; no murmurs, rubs, or gallops,no edema.  Intact distal pulses bilaterally.  Respiratory:  clear to auscultation bilaterally, normal work of breathing GI: soft, nontender, nondistended, + BS MS: no deformity or atrophy  Skin: warm and dry, no rash Neuro:  Alert and Oriented x 3, Strength and sensation are intact Psych: euthymic mood, full affect  Wt Readings from Last 3 Encounters:  01/12/23 221 lb 3.2 oz (100.3 kg)  12/16/22 224 lb 6.4 oz (101.8 kg)  12/07/22 226 lb (102.5 kg)      Studies/Labs Reviewed:   None  Recent Labs: 11/13/2022: ALT 24; Magnesium  1.8; NT-Pro BNP 400; TSH 1.610 12/21/2022: BUN 14; Creatinine, Ser 0.80; Hemoglobin 15.0; Platelets 263.0; Potassium 4.1; Sodium 140    ASSESSMENT:    1. OSA on CPAP    2. Essential hypertension      PLAN:  In order of problems listed above:  OSA - The patient is tolerating PAP therapy well without any problems.  The patient has been using and benefiting from PAP use and will continue to benefit from therapy.  -he uses a travel CPAP device -He has not had a sleep study in 10 years so I will order a split night sleep study to assess degree of OSA and pressure setting needed  Hypertension -BP controlled on exam today -Continue prescription drug management with losartan  HCT 50/12.5 mg daily and Toprol  XL 25 mg daily with.  Refills   Time Spent: 20 minutes total time of encounter, including 15 minutes spent in face-to-face patient care on the date of this encounter. This time includes coordination of care and counseling regarding above mentioned problem list. Remainder of non-face-to-face time involved reviewing chart documents/testing relevant to the patient encounter and documentation in the medical record. I have independently reviewed documentation from referring provider  Medication Adjustments/Labs and Tests Ordered: Current medicines are reviewed at length with the patient today.  Concerns regarding medicines are outlined above.  Medication changes, Labs and Tests ordered today are listed in the Patient Instructions below.  There are no Patient Instructions on file for this visit.   Signed, Wilbert Bihari, MD  01/12/2023 11:21 AM    [facility] [address] [place], [place], [place]  [address] Phone: [phone]; Fax: [phone]

## 2023-01-12 NOTE — Addendum Note (Signed)
 Addended by: Luellen Pucker on: 01/12/2023 11:28 AM   Modules accepted: Orders

## 2023-01-14 ENCOUNTER — Institutional Professional Consult (permissible substitution): Payer: Medicare PPO | Admitting: Internal Medicine

## 2023-01-21 ENCOUNTER — Ambulatory Visit: Payer: Medicare PPO | Attending: Cardiology | Admitting: Cardiology

## 2023-01-21 ENCOUNTER — Encounter: Payer: Self-pay | Admitting: Cardiology

## 2023-01-21 VITALS — BP 110/72 | HR 66 | Resp 16 | Ht 71.0 in | Wt 219.0 lb

## 2023-01-21 DIAGNOSIS — R739 Hyperglycemia, unspecified: Secondary | ICD-10-CM

## 2023-01-21 DIAGNOSIS — R0609 Other forms of dyspnea: Secondary | ICD-10-CM | POA: Diagnosis not present

## 2023-01-21 DIAGNOSIS — I2584 Coronary atherosclerosis due to calcified coronary lesion: Secondary | ICD-10-CM | POA: Diagnosis not present

## 2023-01-21 DIAGNOSIS — I2510 Atherosclerotic heart disease of native coronary artery without angina pectoris: Secondary | ICD-10-CM

## 2023-01-21 DIAGNOSIS — F1010 Alcohol abuse, uncomplicated: Secondary | ICD-10-CM | POA: Diagnosis not present

## 2023-01-21 DIAGNOSIS — I5022 Chronic systolic (congestive) heart failure: Secondary | ICD-10-CM | POA: Diagnosis not present

## 2023-01-21 MED ORDER — ENTRESTO 49-51 MG PO TABS: 1.0000 | ORAL_TABLET | Freq: Two times a day (BID) | ORAL | 6 refills | Status: DC

## 2023-01-21 NOTE — Patient Instructions (Signed)
Medication Instructions:  Your physician has recommended you make the following change in your medication:  Stop Losartan hydrochlorothiazide Start Entresto 49/51 by mouth twice daily   *If you need a refill on your cardiac medications before your next appointment, please call your pharmacy*   Lab Work: Have lab work done at [facility] on the first floor today--A1C If you have labs (blood work) drawn today and your tests are completely normal, you will receive your results only by: [employer] (if you have MyChart) OR A paper copy in the mail If you have any lab test that is abnormal or we need to change your treatment, we will call you to review the results.   Testing/Procedures: none   Follow-Up: At [facility], you and your health needs are our priority.  As part of our continuing mission to provide you with exceptional heart care, we have created designated Provider Care Teams.  These Care Teams include your primary Cardiologist (physician) and Advanced Practice Providers (APPs -  Physician Assistants and Nurse Practitioners) who all work together to provide you with the care you need, when you need it.  We recommend signing up for the patient portal called "MyChart".  Sign up information is provided on this After Visit Summary.  MyChart is used to connect with patients for Virtual Visits (Telemedicine).  Patients are able to view lab/test results, encounter notes, upcoming appointments, etc.  Non-urgent messages can be sent to your provider as well.   To learn more about what you can do with MyChart, go to [email or web address].    Your next appointment:   2 month(s)  Provider:   Yates Decamp, MD     Other Instructions

## 2023-01-21 NOTE — Progress Notes (Signed)
Cardiology Office Note:  .   Date:  01/21/2023  ID:  Jose Fujita., DOB 1945/11/23, MRN [id number] PCP: Tally Joe, MD  [facility] Providers Cardiologist:  Yates Decamp, MD   History of Present Illness: .   Jose Yakir Moffit. is a 78 y.o. Caucasian patient with hypercholesterolemia, primary hypertension, OSA on CPAP, COPD with mild obstructive disease, alcoholic peripheral neuropathy and has reduced alcohol intake significantly over the past 2 years, coronary artery disease by cardiac catheterization on 11/19/2022 with occluded RCA and heavily calcified proximal and mid LAD and recommended consideration for CABG, severely reduced LVEF at 30 to 35% on 10/29/2022 presents for 6-week follow-up.  Question was whether his cardiomyopathy was related to alcohol use versus ischemic cardiomyopathy.  Also he has underlying COPD and I am suspecting he has pulmonary fibrosis. and hence referral was made for pulmonary medicine and repeat evaluation for OSA and sleep study. The consults are done and pending final word on fitness for CABG.   In the interim patient has been compliant with all his medication regimen and has cut down on his alcohol still drinking about 2-3 drinks a day of hard liquor.  No chest pain, no shortness of breath, no leg edema, states that he uses only Lasix occasionally.  Discussed the use of AI scribe software for clinical note transcription with the patient, who gave verbal consent to proceed.  History of Present Illness   The patient, with a history of heart disease and alcohol use, presents with concerns about his breathing and potential bypass surgery. He reports his breathing as "okay" and possibly improved, but provides no further details. He has seen a sleep doctor, Dr. Carolanne Grumbling, but expresses dissatisfaction with the visit and confusion about the follow-up plan. He is also under the care of a lung doctor, Dr. Marchelle Gearing, who has ordered a CT scan of the chest and a  pulmonary function test. The results of these tests will help determine if the patient is a candidate for bypass surgery.  The patient is currently taking metoprolol, which is managed by his wife. He reports no noticeable difference since starting the medication. He also admits to daily alcohol consumption, specifically one to two fingers of bourbon. He denies any nocturnal dyspnea or leg swelling.  The patient also mentions a preference for salty foods, but is trying to reduce his intake. He expresses understanding of the potential consequences of high salt intake on his heart function.      Labs   Lab Results  Component Value Date   CHOL 137 11/27/2022   HDL 46 11/27/2022   LDLCALC 62 11/27/2022   TRIG 175 (H) 11/27/2022   CHOLHDL 3.0 11/27/2022   Lab Results  Component Value Date   NA 140 12/21/2022   K 4.1 12/21/2022   CO2 24 12/21/2022   GLUCOSE 150 (H) 12/21/2022   BUN 14 12/21/2022   CREATININE 0.80 12/21/2022   CALCIUM 9.6 12/21/2022   EGFR 91 12/21/2022   GFRNONAA >60 03/28/2020      Latest Ref Rng & Units 12/21/2022   12:00 AM 11/27/2022    4:19 PM 11/19/2022   12:34 PM  BMP  Glucose 70 - 99 mg/dL 865  784    BUN 8 - 27 mg/dL 14  13    Creatinine 6.96 - 1.27 mg/dL 2.95  2.84    BUN/Creat Ratio 10 - 24 18  17     Sodium 134 - 144 mmol/L 140  140  142    140   Potassium 3.5 - 5.2 mmol/L 4.1  4.2  3.5    3.7   Chloride 96 - 106 mmol/L 99  97    CO2 20 - 29 mmol/L 24  27    Calcium 8.6 - 10.2 mg/dL 9.6  9.7        Latest Ref Rng & Units 12/21/2022    2:20 PM 11/19/2022   12:34 PM 11/13/2022    3:39 PM  CBC  WBC 4.0 - 10.5 K/uL 10.5   11.0   Hemoglobin 13.0 - 17.0 g/dL 09.8  11.9    14.7  82.9   Hematocrit 39.0 - 52.0 % 43.8  36.0    38.0  38.2   Platelets 150.0 - 400.0 K/uL 263.0   206    No results found for: "HGBA1C"  External Labs:  Labs 08/06/2022:   Serum glucose 1 135 mg, BUN 12, creatinine 0.7, EGFR 94 mL, sodium 140, potassium 4.1, LFTs  normal except for minimally elevated AST at 48 (0-39).  Uric acid 5.7, normal.   Protocol is normal and 72, triglycerides 127, HDL 48, LDL 101.    TSH normal at 3.00.   Hb 14.1/HCT 43.6, platelets 267.  Microcytic indicis.  Review of Systems  Cardiovascular:  Positive for dyspnea on exertion (mild). Negative for chest pain and leg swelling.    Physical Exam:   VS:  BP 110/72 (BP Location: Right Arm, Patient Position: Sitting, Cuff Size: Large)   Pulse 66   Resp 16   Ht 5\' 11"  (1.803 m)   Wt 219 lb (99.3 kg)   SpO2 96%   BMI 30.54 kg/m    Wt Readings from Last 3 Encounters:  01/21/23 219 lb (99.3 kg)  01/12/23 221 lb 3.2 oz (100.3 kg)  12/16/22 224 lb 6.4 oz (101.8 kg)     Physical Exam Neck:     Vascular: No carotid bruit or JVD.  Cardiovascular:     Rate and Rhythm: Normal rate and regular rhythm.     Pulses: Intact distal pulses.     Heart sounds: Normal heart sounds. No murmur heard.    No gallop.  Pulmonary:     Effort: Pulmonary effort is normal. Prolonged expiration present.     Breath sounds: Examination of the right-middle field reveals rales. Examination of the right-lower field reveals rales. Examination of the left-lower field reveals rales. Rales present.  Abdominal:     General: Bowel sounds are normal.     Palpations: Abdomen is soft.  Musculoskeletal:     Right lower leg: No edema.     Left lower leg: No edema.     Studies Reviewed: .    Echocardiogram 10/29/2022:  1. Left ventricular ejection fraction, by estimation, is 30 to 35%. The left ventricle has moderately decreased function. The left ventricle demonstrates global hypokinesis. The left ventricular internal cavity size was moderately to severely dilated. Left ventricular diastolic parameters are consistent with Grade II diastolic dysfunction (pseudonormalization). The average left ventricular global longitudinal strain is -14.1 %. The global longitudinal strain is abnormal. 2. Right  ventricular systolic function is normal. The right ventricular size is normal. 3. Left atrial size was severely dilated. 4. The mitral valve is normal in structure. Trivial mitral valve regurgitation. No evidence of mitral stenosis. 5. The aortic valve is tricuspid. Aortic valve regurgitation is trivial. Aortic valve sclerosis/calcification is present, without any evidence of aortic stenosis. 6. Pulmonic valve regurgitation is moderate. 7. The  inferior vena cava is normal in size with greater than 50% respiratory variability, suggesting right atrial pressure of 3 mmHg.   CARDIAC CATHETERIZATION 11/19/2022   Narrative   Prox RCA to Mid RCA lesion is 100% stenosed.   Prox LAD lesion is 80% stenosed.   Ost Cx to Prox Cx lesion is 20% stenosed.   Prox LAD to Mid LAD lesion is 90% stenosed.   2nd Diag lesion is 50% stenosed.   Mid LAD lesion is 80% stenosed.   Severe, heavily calcified stenosis throughout the proximal LAD. There is a moderate caliber Diagonal branch arising from the mid LAD. Mild non-obstructive plaque in the proximal Circumflex Large dominant RCA with dense calcification throughout the proximal and mid vessel. The vessel could not be selectively engaged but non-selective angiography demonstrates chronic total occlusion of the proximal vessel. The distal RCA and the PDA fills from left to right collaterals. Elevation of right and left heart pressures.    Recommendations: He has complex two vessel CAD with heavily calcified severe stenosis throughout the proximal LAD. The RCA is totally occluded. While PCI of the LAD with orbital atherectomy would be an option, I think we need to consider bypass surgery for complete revascularization. EKG:         EKG 12/07/2022: Normal sinus rhythm at rate of 80 bpm, normal axis, no evidence of ischemia. PVCs (2).   Medications and allergies    No Known Allergies   Current Outpatient Medications:    albuterol (PROVENTIL HFA;VENTOLIN  HFA) 108 (90 Base) MCG/ACT inhaler, Inhale 1-2 puffs into the lungs every 6 (six) hours as needed for wheezing or shortness of breath., Disp: 1 Inhaler, Rfl: 0   albuterol (PROVENTIL) (2.5 MG/3ML) 0.083% nebulizer solution, Take 3 mLs (2.5 mg total) by nebulization every 6 (six) hours as needed for wheezing or shortness of breath., Disp: 75 mL, Rfl: 12   allopurinol (ZYLOPRIM) 300 MG tablet, Take 300 mg by mouth daily., Disp: , Rfl:    aspirin 81 MG tablet, Take 81 mg by mouth daily., Disp: , Rfl:    budesonide (PULMICORT) 0.5 MG/2ML nebulizer solution, INHALE TWO MILLILITERS VIA NEBULIZATION BY MOUTH TWICE A DAY, Disp: 120 mL, Rfl: 0   buPROPion (WELLBUTRIN XL) 150 MG 24 hr tablet, Take 1 tablet by mouth every morning., Disp: , Rfl:    esomeprazole (NEXIUM) 40 MG capsule, TAKE ONE CAPSULE BY MOUTH DAILY IN THE MORNING (Patient taking differently: Take 40 mg by mouth daily.), Disp: 30 capsule, Rfl: 0   fluticasone (FLONASE) 50 MCG/ACT nasal spray, Place 1 spray into both nostrils daily., Disp: 16 g, Rfl: 2   furosemide (LASIX) 40 MG tablet, Take 1 tablet (40 mg total) by mouth daily., Disp: 90 tablet, Rfl: 3   Glucosamine HCl 1500 MG TABS, Take 1 tablet by mouth daily., Disp: , Rfl:    magnesium oxide (MAG-OX) 400 MG tablet, Take 1 tablet (400 mg total) by mouth daily., Disp: 30 tablet, Rfl: 3   metoprolol succinate (TOPROL XL) 25 MG 24 hr tablet, Take 1 tablet (25 mg total) by mouth daily., Disp: 90 tablet, Rfl: 3   Multiple Vitamins-Iron (MULTIVITAMINS WITH IRON) TABS, Take 1 tablet by mouth daily., Disp: , Rfl:    rosuvastatin (CRESTOR) 20 MG tablet, Take 1 tablet (20 mg total) by mouth daily., Disp: 90 tablet, Rfl: 3   sacubitril-valsartan (ENTRESTO) 49-51 MG, Take 1 tablet by mouth 2 (two) times daily., Disp: 60 tablet, Rfl: 6   ASSESSMENT AND PLAN: .  ICD-10-CM   1. Coronary artery disease due to calcified coronary lesion  I25.10    I25.84     2. Chronic HFrEF (heart failure with  reduced ejection fraction) (HCC)  I50.22     3. DOE (dyspnea on exertion)  R06.09     4. ETOH abuse  F10.10     5. Hyperglycemia  R73.9 HgB A1c     Medications Discontinued During This Encounter  Medication Reason   losartan-hydrochlorothiazide (HYZAAR) 50-12.5 MG tablet     Assessment and Plan    Cardiomyopathy with reduced ejection fraction Moderately reduced heart function (30-35%) possibly due to a combination of coronary artery disease, underlying lung disease, and alcohol use. Currently on Metoprolol Succinate 25mg . No worsening of symptoms reported. -Discontinue Losartan. -Initiate Entresto 49/51mg . Patient was on Losartan 50/12.5 mg daily -Continue Metoprolol Succinate 25mg . -Check Hemoglobin A1c due to hyperglycemia noted on labs.  Sleep Apnea  Unclear if sleep study was ordered. Will clarify with Dr. Malachy Mood staff. -Follow up with sleep specialist's office to clarify plan and ensure sleep study or appropriate steps are taken.  Alcohol Use Patient reports daily alcohol intake of 1-2 servings of bourbon. -Advise reduction to 1 serving per day. I am suspecting non ischemic cardiomyopathy as there was no regional wall motion abnormality on echo.   Pending Bypass Surgery Decision on candidacy for bypass surgery is pending further evaluation by pulmonologist and sleep specialist. -Continue current plan of evaluation before making final decision on surgery.  Follow-up in 6 weeks to reassess response to medication changes and progress of evaluations.   Signed,  Yates Decamp, MD, [facility] 01/21/2023, 5:01 PM [facility] [address] [place], [place] [address] Phone: [phone]. Fax:  [phone]

## 2023-01-26 DIAGNOSIS — R739 Hyperglycemia, unspecified: Secondary | ICD-10-CM | POA: Diagnosis not present

## 2023-01-27 ENCOUNTER — Encounter: Payer: Self-pay | Admitting: Cardiology

## 2023-01-27 ENCOUNTER — Telehealth: Payer: Self-pay | Admitting: *Deleted

## 2023-01-27 LAB — HEMOGLOBIN A1C
Est. average glucose Bld gHb Est-mCnc: 143 mg/dL
Hgb A1c MFr Bld: 6.6 % — ABNORMAL HIGH (ref 4.8–5.6)

## 2023-01-27 NOTE — Telephone Encounter (Signed)
-----   Message from Nurse Elease Hashimoto A sent at 01/21/2023 12:58 PM EST ----- Regarding: sleep study Jose Hines, Patient saw Dr Jacinto Halim today and had questions regarding sleep study.  Can you contact him? Thanks [employer]

## 2023-01-27 NOTE — Progress Notes (Signed)
A1c in diabetic range and may benefit from [place]. I have faxed report to PCP

## 2023-01-29 IMAGING — CR DG CHEST 2V
2 series · 2 of 2 positions shown · non-contrast
Comparison: 03/28/2020

CLINICAL DATA: Cough, shortness of breath, wheezing for 2-3 weeks,
history of pneumonia, former smoker, hypertension, GERD

EXAM:
CHEST - 2 VIEW

[w chest pa]
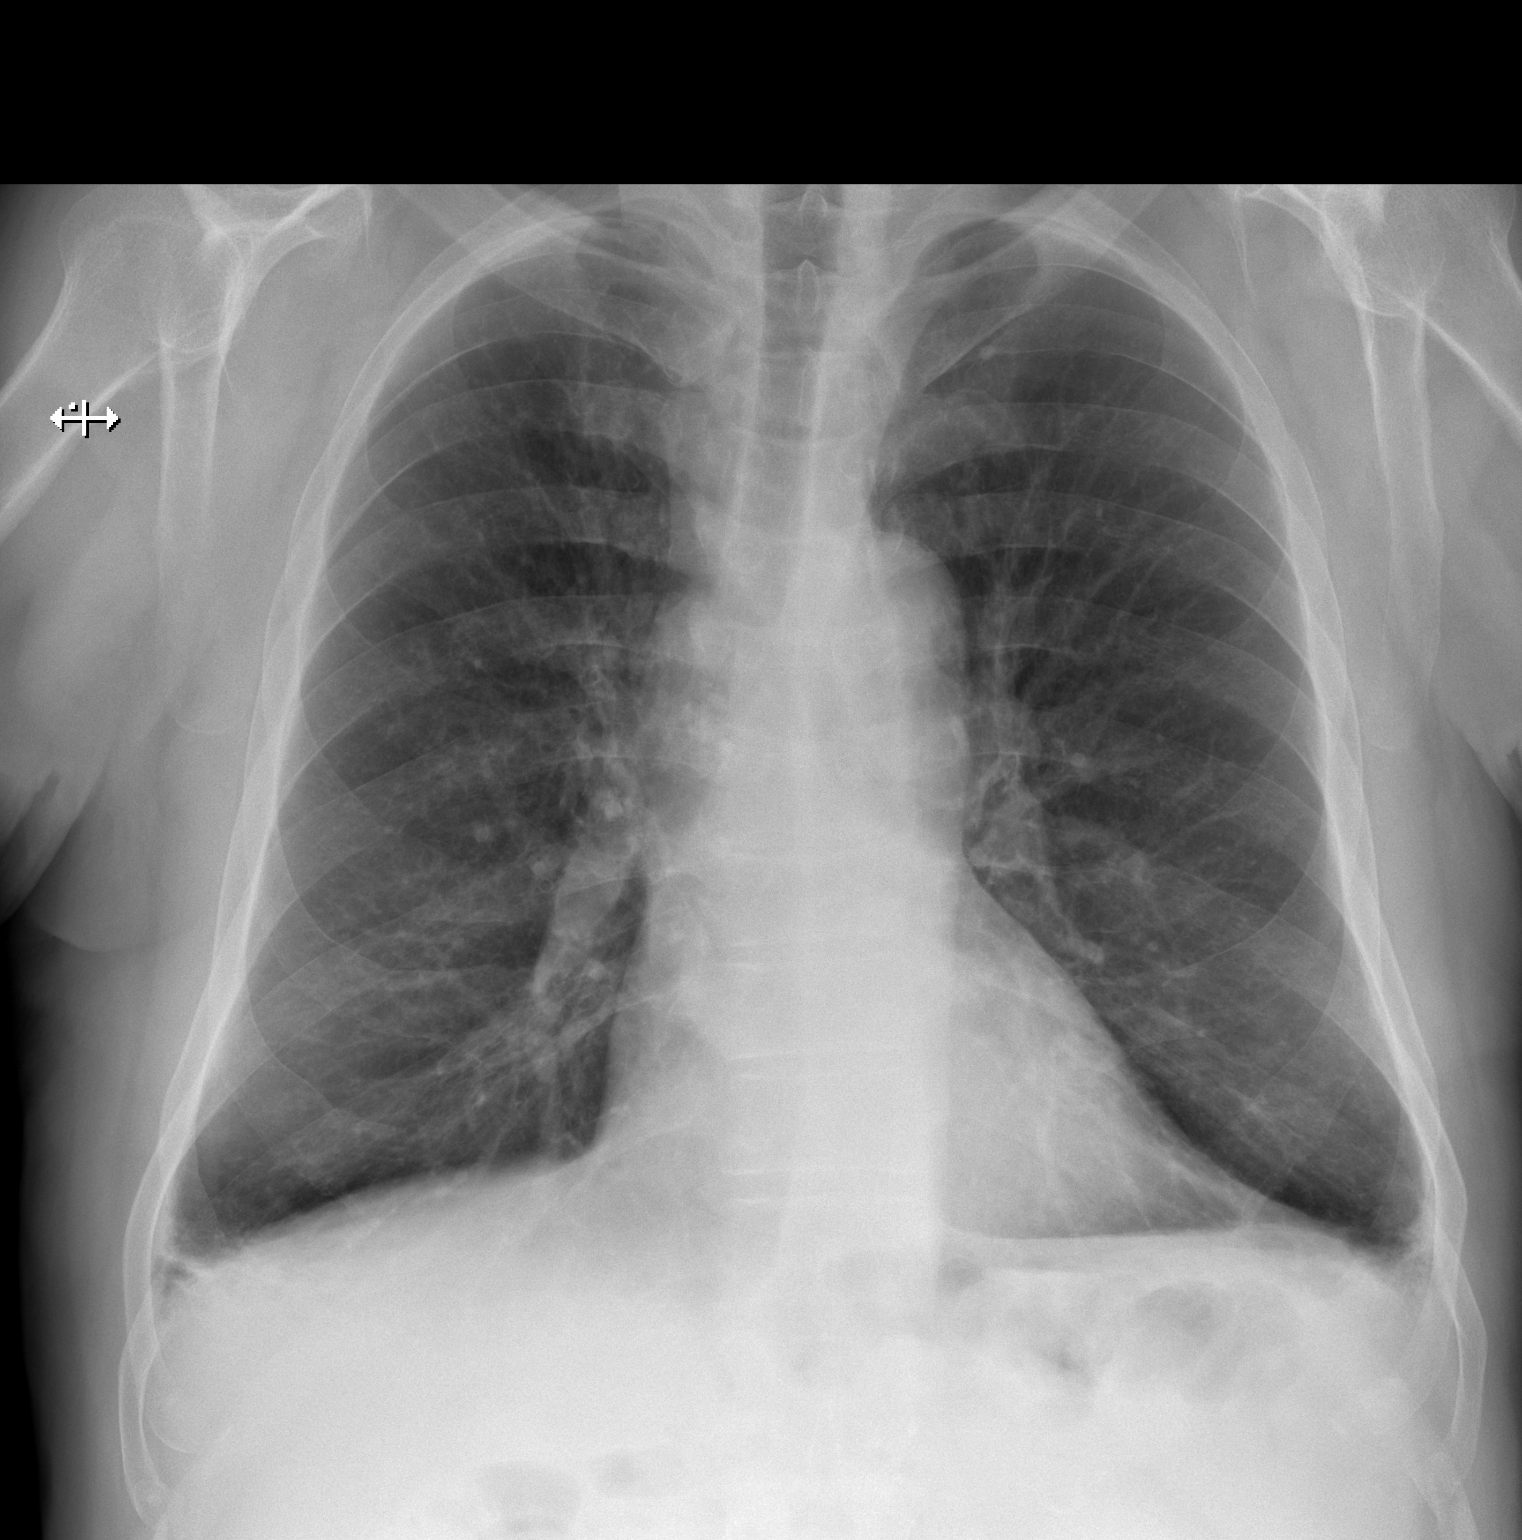

[w chest lat]
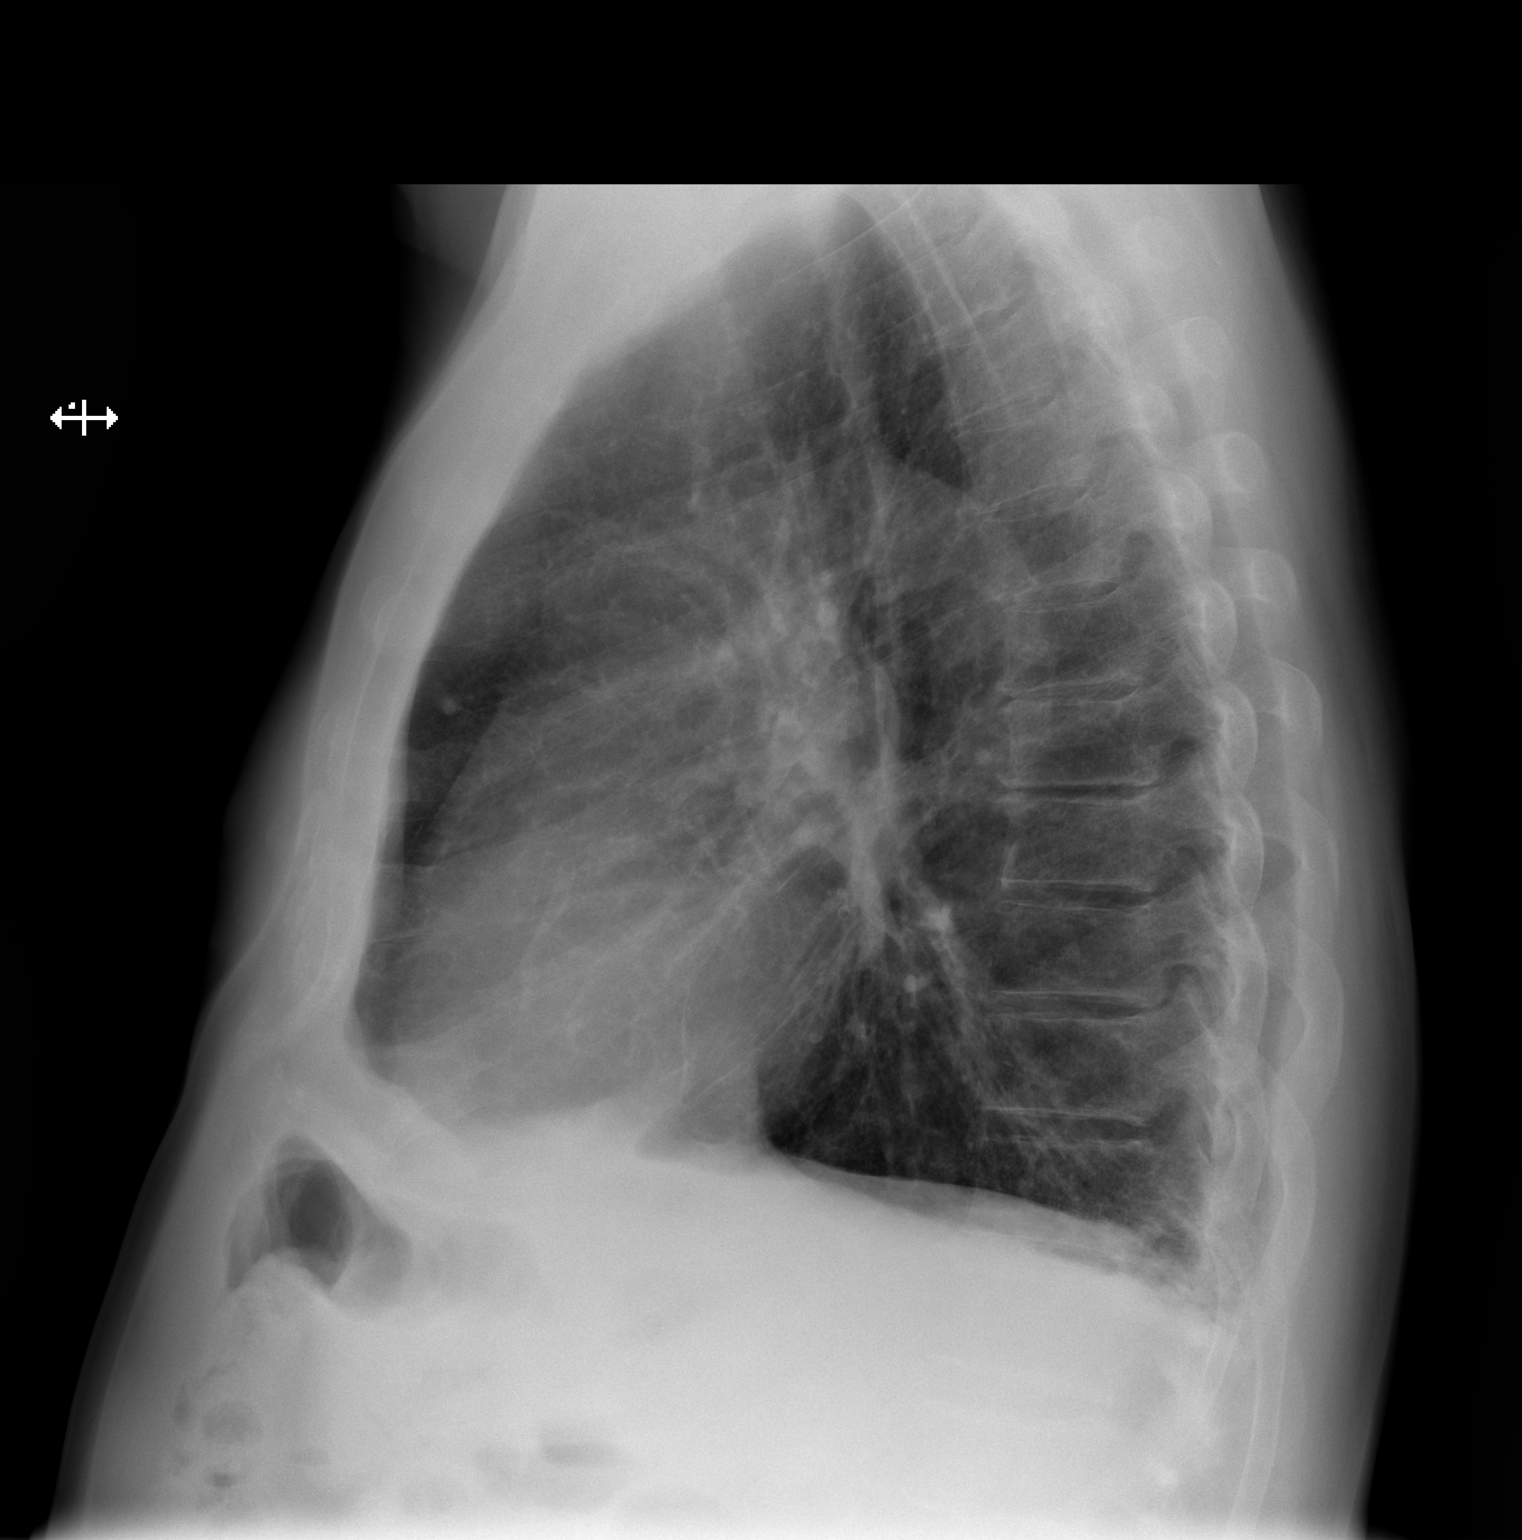

[2 of 2 positions shown; findings below may reference images not displayed]

FINDINGS: Normal heart size, mediastinal contours, and pulmonary vascularity.

Bronchitic changes with bibasilar atelectasis.

Lungs otherwise clear.

No acute infiltrate, pleural effusion, or pneumothorax.

Bones demineralized.
IMPRESSION: Bronchitic changes with bibasilar atelectasis.

## 2023-01-29 NOTE — Telephone Encounter (Signed)
Patient called in to get an appointment for his sleep study. He asked when am I going to get my sleep study? I informed him I would get his test authorized today and send it to the sleep lab and ask them to call him with an appointment because they have the calendar there and we don't schedule those. Patient states he was not impressed with Dr Mayford Knife at his visit. He states she came in the room, sat at the computer, never looked a him and she left the room. He mentioned he was thinking about going back to his doctor in [place]. Dr Mayford Knife did not have any notes or sleep studies on the patient to do an office visit. He came from [facility] with no notes.

## 2023-01-29 NOTE — Telephone Encounter (Signed)
Prior Authorization for SPLIT NIGHT sent to [facility] via web portal. Tracking Number . Approved-Tracking #[id number]   DATES-03/24/2023--05/23/2023.

## 2023-02-02 NOTE — Telephone Encounter (Signed)
LMOVM (DPR) advising that split night study has been authorized by insurance and that sleep lab is scheduling in March. Advised pt that he should receive a call from the sleep lab in the next couple of weeks to schedule test.   Direct office number provided for questions or concerns.

## 2023-02-03 NOTE — Telephone Encounter (Signed)
Message has not been read.  I spoke with patient who asked me to contact his wife Lupita Leash.  I spoke with Lupita Leash.  She read message and will check medications when she gets home.  She is aware patient should not be taking losartan hydrochlorothiazide.  Patient is not using flonase nasal spray or inhalers.  Will sometimes use nebulizer.  Lupita Leash will send an update through my chart

## 2023-02-04 NOTE — Telephone Encounter (Signed)
Patient is returning call requesting to speak directly with Irving Burton, RN if possible.

## 2023-02-05 NOTE — Telephone Encounter (Signed)
LMOVM requesting call back to direct number.

## 2023-02-05 NOTE — Telephone Encounter (Signed)
Jose Hines can you check off

## 2023-02-08 NOTE — Telephone Encounter (Signed)
Patient LMOVM on 02/05/23 at 4:32pm.  Patient called back on 02/08/23 and spoke with this RN. Discussed patient's concerns related to recent OV on 01/12/23 and apologized for his experience. He declines to schedule split night study. Pt plans to see Dr. Reginia Naas in [facility]) for sleep medicine consult in March (he had previously seen Dr. Reginia Naas several years ago). Pt expressed appreciation for call and denied additional concerns at this time.  Split night order canceled. Routed to Dr. Mayford Knife as Lorain Childes.

## 2023-02-08 NOTE — Addendum Note (Signed)
Addended by: Fiora Weill C on: 02/08/2023 03:19 PM   Modules accepted: Orders

## 2023-02-18 ENCOUNTER — Ambulatory Visit: Payer: Medicare PPO | Admitting: Internal Medicine

## 2023-02-18 ENCOUNTER — Telehealth: Payer: Self-pay | Admitting: Internal Medicine

## 2023-02-18 ENCOUNTER — Encounter: Payer: Self-pay | Admitting: Internal Medicine

## 2023-02-18 VITALS — BP 132/83 | HR 71 | Temp 98.3°F | Ht 71.0 in | Wt 226.6 lb

## 2023-02-18 DIAGNOSIS — R942 Abnormal results of pulmonary function studies: Secondary | ICD-10-CM

## 2023-02-18 DIAGNOSIS — R0689 Other abnormalities of breathing: Secondary | ICD-10-CM | POA: Diagnosis not present

## 2023-02-18 DIAGNOSIS — J849 Interstitial pulmonary disease, unspecified: Secondary | ICD-10-CM

## 2023-02-18 DIAGNOSIS — Z01811 Encounter for preprocedural respiratory examination: Secondary | ICD-10-CM | POA: Diagnosis not present

## 2023-02-18 DIAGNOSIS — J84112 Idiopathic pulmonary fibrosis: Secondary | ICD-10-CM | POA: Diagnosis not present

## 2023-02-18 NOTE — Progress Notes (Signed)
OV 12/16/2022  Subjective:  Patient ID: Jose Selina Cooley., male , DOB: 03/07/1945 , age 78 y.o. , MRN: [id number] , ADDRESS: [address] [place] [address] PCP Tally Joe, MD Patient Care Team: Tally Joe, MD as PCP - General (Family Medicine) Yates Decamp, MD as PCP - Cardiology (Cardiology)  This Provider for this visit: Treatment Team:  Attending Provider: Kalman Shan, MD    12/16/2022 -   Chief Complaint  Patient presents with   Consult    Sob,and cough and chest congestion. Symptoms started a few weeks ago with sinus drainage      HPI Jose Selina Cooley. 78 y.o. -referred by Dr. Jacinto Halim.  Dr. Jacinto Halim is concerned about presence of interstitial lung disease and also requires preoperative clearance prior to potential cardiac bypass.  Patient affirms understanding of the same.  He is a retired Psychologist, forensic.  He is to Social research officer, government at [employer], [place] in high and also [place].  He believes these buildings were all in the might have been mold but he does not recall any visible mold.  He tells me throughout his life especially from high school he always was noticed to be somebody who would often hugg puff but never actually feel short of breath.  But he says he is had perceivable shortness of breath for the last few to several years particularly several years.  However in the last 3 months has been a decline he is more short of breath.  He says he was short of breath just mowing the grass and his wife had to tell him to go visit with the physician.  And that is how he was diagnosed with coronary artery disease and chronic systolic dysfunction.   He has a history of sleep apnea.  He uses CPAP at night but not seen a sleep doctor for over 15 years.  Sleep doctors at [facility].  On 11/19/2022 Dr. Clifton James did left heart catheterization and he was deemed to have complex two-vessel coronary artery disease with heavily calcified severe stenosis with  complete occlusion of the RCA.  Bypass surgery is being preferred.  He also had a nuclear medicine stress test 10/20/2022 and his ejection fraction is 27%.  He says he does not have any chest pain with exertion.  Echo around [facility] 2024 showed EF 30%.  At this moment in time he is reporting significant dyspnea on exertion and decreased energy.  Dyspnea is at least class III if he finds dyspnea for changing clothes and even taking a shower.  There is also associated cough.  On exam he does have crackles especially Velcro crackles at the lung base..     Sudie Grumbling 02/18/2023  Subjective:  Patient ID: Jose Selina Cooley., male , DOB: 13-Oct-1945 , age 20 y.o. , MRN: [id number] , ADDRESS: [address] [place] [address] PCP Tally Joe, MD Patient Care Team: Tally Joe, MD as PCP - General (Family Medicine) Yates Decamp, MD as PCP - Cardiology (Cardiology)  This Provider for this visit: Treatment Team:  Attending Provider: Kalman Shan, MD    02/18/2023 -   Chief Complaint  Patient presents with   Follow-up    ILD F/U   #Interstitial lung disease workup in progress - #Chronic systolic heart failure with coronary artery disease very likely needs bypass [Dr. Ganji] #History of sleep apnea.  HPI Jose Selina Cooley. 78 y.o. -returns for follow-up to review the test results.  His serology  profile is normal.  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  I agree with this finding I personally visualized it.  He says his shortness of breath is the same he is not any worse.  But he always seems to have noisy breathing.  Reviewed external medical record I noticed that even Dr. Jacinto Halim and documented this.  Symptom score as below.  However, his exercise hypoxemia test is normal.  .  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  Indicated antifibrotic's indicated.  There are 2 nintedanib and pirfenidone.  I do not recommend nintedanib because of his coronary  artery disease.  We discussed pirfenidone and the need to assess sunscreen.  Went over nausea low appetite and weight loss and side effects along with rare diarrhea and also skin rash.  Advised him of the dosing schedule.  I have contacted our pharmacy team about this.  The only discordant thing with his pulmonary workup is that his pulm function test shows obstruction.  He cough during expiration.  With inspiratory flow volume loop looks fine.  There is no emphysema described on the CT chest he was a remote smoker.  He seems at this noisy breathing just audible and seems to be coming from the upper airway.  He did not desaturate.  There could be something going on with his vocal cords.  All this could be asthma.  Will refer him to ENT.    However he also states that few weeks ago he fell down and and therefore he is walking with a limp.  He pointed towards his right knee.  I examined this area there is no deformities.  He is got good range of motion.  I advised him to talk to his primary care physician if he wanted an x-ray.  Reviewed Dr. Verl Dicker note Dr. Jacinto Halim is waiting for our evaluation on preoperative risk assessment.  Rarely anesthesia after cardiac surgery in the setting of IPF can cause IPF flareup.  However I think his life expectancy is shortened with his systolic heart failure currently that is more of a problem than his fibrosis.  But the combined is reducing his life expectancy.  With antifibrotic's his life expectancy can be improved.  Therefore recommending it.  If he needs surgery in terms of bypass he might have to take the risk as this might be the best time point for this.  The risk is not prohibitive but moderate high for prolonged ventilator dependence after cardiac surgery.    SYMPTOM SCALE - ILD 02/18/2023  Current weight   O2 use Ra CPAP sleep apnea.  Shortness of Breath 0 -> 5 scale with 5 being worst (score 6 If unable to do)  At rest 0  Simple tasks - showers, clothes  change, eating, shaving 3.5  Household (dishes, doing bed, laundry) 3.5  Shopping x  Walking level at own pace 3  Walking up Stairs 3.5  Total (30-36) Dyspnea Score 13.5  How bad is your cough? 0  How bad is your fatigue Not good  How bad is nausea Not mch apperties  How bad is vomiting?  x  How bad is diarrhea? x  How bad is anxiety? xx  How bad is depression x  Any chronic pain - if so where and how bad x       Sit/stand test to mimic a stair climbing 12/16/2022  02/18/2023   O2 used ra ra  Number laps completed Sit stand  x 10 Sit stand x 15  Comments about pace Study but below average   Resting Pulse Ox/HR 95% % and 63/min 100% pulse ox with a heart rate 72  Final Pulse Ox/HR 93% % and 86/min 97% with a peak heart rate of 89.  Desaturated </= 88% No   Desaturated <= 3% points No   Got Tachycardic >/= 90/min Like to increase in heart rate but not greater than 90   Symptoms at end of test He was stably short of breath but denied shortness of breath is a symptom   Miscellaneous comments x     CT Chest data from date:*  - personally visualized and independently interpreted : YES - my findings are: agree Narrative & Impression  CLINICAL DATA:  Diffuse/interstitial lung disease.  Chronic cough.   EXAM: CT CHEST WITHOUT CONTRAST   TECHNIQUE: Multidetector CT imaging of the chest was performed following the standard protocol without intravenous contrast. High resolution imaging of the lungs, as well as inspiratory and expiratory imaging, was performed.   RADIATION DOSE REDUCTION: This exam was performed according to the departmental dose-optimization program which includes automated exposure control, adjustment of the mA and/or kV according to patient size and/or use of iterative reconstruction technique.   COMPARISON:  None available.   FINDINGS: Cardiovascular: Atherosclerotic calcification of the aorta, aortic valve and coronary arteries. Enlarged right and  left pulmonary arteries. Heart is at the upper limits of normal in size to mildly enlarged. No pericardial effusion.   Mediastinum/Nodes: Calcified and enlarged noncalcified mediastinal and hilar lymph nodes. Hilar regions are difficult to definitively evaluate without IV contrast. No axillary adenopathy. Esophagus is grossly unremarkable.   Lungs/Pleura: Calcified granulomas. Basilar subpleural reticulation, ground-glass, traction bronchiolectasis and suspected honeycombing. Scattered mucoid impaction. No pleural fluid. Airway is unremarkable. Expiratory phase imaging was not performed in true expiration, limiting the evaluation for air trapping.   Upper Abdomen: Visualized portions of the liver, gallbladder, adrenal glands, right kidney, spleen, pancreas, stomach and bowel are grossly unremarkable. No upper abdominal adenopathy.   Musculoskeletal: Degenerative changes in the spine.   IMPRESSION: 1. 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 Am Rosezetta Schlatter Crit Care Med Vol 198, Iss 5, [phone], Sep 05 2016. 2. Aortic atherosclerosis (ICD10-I70.0). Coronary artery calcification. 3. Enlarged right and left pulmonary arteries, indicative of pulmonary arterial hypertension.     Electronically Signed   By: Leanna Battles M.D.   On: 01/13/2023 12:36     Latest Reference Range & Units 12/21/22 14:20  IgE (Immunoglobulin E), Serum <OR=114 kU/L 13  Aspergillus Fumigatus NEGATIVE  NEGATIVE  Pigeon Serum NEGATIVE  NEGATIVE  Anti Nuclear Antibody (ANA) NEGATIVE  NEGATIVE  Cyclic Citrullin Peptide Ab UNITS <16  RA Latex Turbid. <14 IU/mL <10    PFT     Latest Ref Rng & Units 12/28/2022    2:23 PM  PFT Results  FVC-Pre L 2.49   FVC-Predicted Pre % 55   Pre FEV1/FVC % %  54   FEV1-Pre L 1.34   FEV1-Predicted Pre % 41   DLCO uncorrected ml/min/mmHg 14.92   DLCO UNC% % 56   DLCO corrected ml/min/mmHg 14.76   DLCO COR %Predicted % 56   DLVA Predicted % 103        LAB RESULTS last 96 hours No results found.       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   reports that he quit smoking about 54 years ago. His smoking use included cigarettes. He started smoking about 64 years ago. He has a 20 pack-year smoking history. He has never used smokeless tobacco.  Past Surgical History:  Procedure Laterality Date   COLONOSCOPY     INGUINAL HERNIA REPAIR     POLYPECTOMY     RIGHT/LEFT HEART CATH AND CORONARY ANGIOGRAPHY N/A 11/19/2022   Procedure: RIGHT/LEFT HEART CATH AND CORONARY ANGIOGRAPHY;  Surgeon: Kathleene Hazel, MD;  Location: [facility];  Service: Cardiovascular;  Laterality: N/A;   TONSILLECTOMY AND ADENOIDECTOMY      No Known Allergies  Immunization History  Administered Date(s) Administered   Influenza Split 10/16/2016   Influenza, High Dose Seasonal PF 10/17/2017, 09/30/2018   Tdap 12/24/2016    Family History  Problem Relation Age of Onset   Transient ischemic attack Mother    Heart disease Father 54   Other Son        car wreck   Colon cancer Neg Hx    Esophageal cancer Neg Hx    Rectal cancer Neg Hx    Stomach cancer Neg Hx      Current Outpatient Medications:    albuterol (PROVENTIL HFA;VENTOLIN HFA) 108 (90 Base) MCG/ACT inhaler, Inhale 1-2 puffs into the lungs every 6 (six) hours as needed for  wheezing or shortness of breath., Disp: 1 Inhaler, Rfl: 0   albuterol (PROVENTIL) (2.5 MG/3ML) 0.083% nebulizer solution, Take 3 mLs (2.5 mg total) by nebulization every 6 (six) hours as needed for wheezing or shortness of breath., Disp: 75 mL, Rfl: 12   allopurinol (ZYLOPRIM) 300 MG tablet, Take 300 mg by mouth daily., Disp: , Rfl:    aspirin 81 MG tablet, Take 81 mg by mouth daily., Disp: , Rfl:    budesonide (PULMICORT) 0.5 MG/2ML nebulizer solution, INHALE TWO MILLILITERS VIA NEBULIZATION BY MOUTH TWICE A DAY, Disp: 120 mL, Rfl: 0   buPROPion (WELLBUTRIN XL) 150 MG 24 hr tablet, Take 1 tablet by mouth every morning., Disp: , Rfl:    esomeprazole (NEXIUM) 40 MG capsule, TAKE ONE CAPSULE BY MOUTH DAILY IN THE MORNING (Patient taking differently: Take 40 mg by mouth daily.), Disp: 30 capsule, Rfl: 0   fluticasone (FLONASE) 50 MCG/ACT nasal spray, Place 1 spray into both nostrils daily., Disp: 16 g, Rfl: 2   furosemide (LASIX) 40 MG tablet, Take 1 tablet (40 mg total) by mouth daily., Disp: 90 tablet, Rfl: 3   Glucosamine HCl 1500 MG TABS, Take 1 tablet by mouth daily., Disp: , Rfl:    magnesium oxide (MAG-OX) 400 MG tablet, Take 1 tablet (400 mg total) by mouth daily., Disp: 30 tablet, Rfl: 3   metoprolol succinate (TOPROL XL) 25 MG 24 hr tablet, Take 1 tablet (25 mg total) by mouth daily., Disp: 90 tablet, Rfl: 3   Multiple Vitamins-Iron (MULTIVITAMINS WITH IRON) TABS, Take 1 tablet by mouth daily., Disp: , Rfl:    rosuvastatin (CRESTOR) 20 MG tablet, Take 1 tablet (20 mg total) by mouth daily., Disp: 90 tablet, Rfl: 3   sacubitril-valsartan (ENTRESTO) 49-51 MG, Take 1 tablet by mouth 2 (two) times daily., Disp: 60  tablet, Rfl: 6      Objective:   Vitals:   02/18/23 1524  BP: 132/83  Pulse: 71  Temp: 98.3 F (36.8 C)  TempSrc: Oral  SpO2: 97%  Weight: 226 lb 9.6 oz (102.8 kg)  Height: 5\' 11"  (1.803 m)    Estimated body mass index is 31.6 kg/m as calculated from the following:    Height as of this encounter: 5\' 11"  (1.803 m).   Weight as of this encounter: 226 lb 9.6 oz (102.8 kg).  @WEIGHTCHANGE @  [employer]   02/18/23 1524  Weight: 226 lb 9.6 oz (102.8 kg)     Physical Exam   General: No distress. Looks well . NOISY BREATHING UPPER AIRWAY O2 at rest: no Cane present: no Sitting in wheel chair: no Frail: no Obese: no Neuro: Alert and Oriented x 3. GCS 15. Speech normal Psych: Pleasant Resp:  Barrel Chest - no.  Wheeze - no, Crackles - YES BASE, No overt respiratory distress CVS: Normal heart sounds. Murmurs - no Ext: Stigmata of Connective Tissue Disease - no. WALKS MILD LIMP HEENT: Normal upper airway. PEERL +. No post nasal drip        Assessment:       ICD-10-CM   1. IPF (idiopathic pulmonary fibrosis) (HCC)  J84.112 Ambulatory referral to ENT    2. ILD (interstitial lung disease) (HCC)  J84.9 Ambulatory referral to ENT    3. Abnormal flow volume loop  R94.2 Ambulatory referral to ENT    4. Noisy breathing  R06.89 Ambulatory referral to ENT    5. Preop respiratory exam  Z01.811 Ambulatory referral to ENT        1) RISK FOR PROLONGED MECHANICAL VENTILAION - > 48h  1A) Arozullah - Prolonged mech ventilation risk Arozullah Postperative Pulmonary Risk Score - for mech ventilation dependence >48h USAA, Ann Surg 2000, major non-cardiac surgery) Comment Score  Type of surgery - abd ao aneurysm (27), thoracic (21), neurosurgery / upper abdominal / vascular (21), neck (11) thoracic 21  Emergency Surgery - (11) no   ALbumin < 3 or poor nutritional state - (9) no   BUN > 30 -  (8) no   Partial or completely dependent functional status - (7) no   COPD -  (6) IPFage 6  Age - 60 to 51 (4), > 70  (6) Age 50 6  TOTAL  33  Risk Stratifcation scores  - < 10 (0.5%), 11-19 (1.8%), 20-27 (4.2%), 28-40 (10.1%), >40 (26.6%)  Miderate - high          Plan:     Patient Instructions     ICD-10-CM   1. IPF (idiopathic pulmonary  fibrosis) (HCC)  J84.112     2. ILD (interstitial lung disease) (HCC)  J84.9     3. Noisy breathing  R06.89     4. Preop respiratory exam  Z01.811      #IPF Giving you 02/18/2023 diagnosis of  idiopathic pulmonary fibrosis [IPF].  I base this based on his symptoms of shortness of breath and cough and also crackles on the exam, age greater than 42 and the progressive nature of his symptoms and blood test negative for autoimune and descriptin of probable UIP on CT chest.   Plan - Start pirfenidone per protocol  #Preoperative respiratory exam  The fact you are able to sit and stand 1rtimes without dropping oxygen is somewhat reassuring in terms of being able to have a bypass surgery but overall risk for prolonged  ventilator dependence is around moderate high.  This based on the fact of pulmonary fibrosis and also your age and the type of surgery.  Patients can have pulmonary fibrosis flareup   Plan - Moderate high risk for pulmonary complications after bypass surgery   #Noisy breathing even at rest -Abnormal flow-volume pattern on the PFT  Unclear cause and PFT shows obstruction when he has obvious and there is no emphysema described   Plan - Talk about this with the sleep doctor -Refer ENT -Based on above can consider inhaler therapy.   # Goals of care  -I think overall life expectancy is shortened because of cardiac disease with low ejection fraction and pulmonary fibrosis.  Currently the cardiac disease is a bigger issue.  In the presence of antifibrotic's patients can potentially live over 5 years to 10 years compared to the natural progression of this disease.  Therefore if surgery is needed for the heart it is probably worth taking the risk and having a bypass surgery   Follow-up - Return within 6 weeks to discuss progress and also initiation of antifibrotic's.  -Okay to see nurse practitioner   FOLLOWUP Return in about 6 weeks (around 04/01/2023) for 30 min visit,  with any of the APPS, Face to Face Visit.    ( Level 05 visit E&M 2024: Estb >= 40 min  in  visit type: on-site physical face to visit  in total care time and counseling or/and coordination of care by this undersigned MD - Dr Kalman Shan. This includes one or more of the following on this same day 02/18/2023: pre-charting, chart review, note writing, documentation discussion of test results, diagnostic or treatment recommendations, prognosis, risks and benefits of management options, instructions, education, compliance or risk-factor reduction. It excludes time spent by the CMA or office staff in the care of the patient. Actual time 45 min)    SIGNATURE    Dr. Kalman Shan, M.D., F.C.C.P,  Pulmonary and Critical Care Medicine Staff Physician, [facility] Center Director - Interstitial Lung Disease  Program  Pulmonary Fibrosis [facility] at [facility] [place], [place], [address]  Pager: [phone], If no answer or between  15:00h - 7:00h: call [phone] Telephone: [phone]  4:11 PM 02/18/2023

## 2023-02-18 NOTE — Patient Instructions (Addendum)
ICD-10-CM   1. IPF (idiopathic pulmonary fibrosis) (HCC)  J84.112     2. ILD (interstitial lung disease) (HCC)  J84.9     3. Noisy breathing  R06.89     4. Preop respiratory exam  Z01.811      #IPF Giving you 02/18/2023 diagnosis of  idiopathic pulmonary fibrosis [IPF].  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   Plan - Start pirfenidone per protocol  #Preoperative respiratory exam  The fact you are able to sit and stand 1rtimes without dropping oxygen is somewhat reassuring in terms of being able to have a bypass surgery but overall risk for prolonged ventilator dependence is around moderate high.  This based on the fact of pulmonary fibrosis and also your age and the type of surgery.  Patients can have pulmonary fibrosis flareup   Plan - Moderate high risk for pulmonary complications after bypass surgery   #Noisy breathing even at rest -Abnormal flow-volume pattern on the PFT  Unclear cause and PFT shows obstruction when he has obvious and there is no emphysema described   Plan - Talk about this with the sleep doctor -Refer ENT -Based on above can consider inhaler therapy.   # Goals of care  -I think overall life expectancy is shortened because of cardiac disease with low ejection fraction and pulmonary fibrosis.  Currently the cardiac disease is a bigger issue.  In the presence of antifibrotic's patients can potentially live over 5 years to 10 years compared to the natural progression of this disease.  Therefore if surgery is needed for the heart it is probably worth taking the risk and having a bypass surgery   Follow-up - Return within 6 weeks to discuss progress and also initiation of antifibrotic's.  -Okay to see nurse practitioner

## 2023-02-18 NOTE — Telephone Encounter (Signed)
Jose Hines  [name]. -please start Esbriet per protocol he has coronary artery disease in the looking at bypass.

## 2023-02-22 ENCOUNTER — Telehealth: Payer: Self-pay | Admitting: Pharmacist

## 2023-02-22 DIAGNOSIS — Z5181 Encounter for therapeutic drug level monitoring: Secondary | ICD-10-CM

## 2023-02-22 DIAGNOSIS — J84112 Idiopathic pulmonary fibrosis: Secondary | ICD-10-CM

## 2023-02-22 NOTE — Telephone Encounter (Signed)
Will start pirfenidone BIV  Knox Saliva, PharmD, MPH, BCPS, CPP Clinical Pharmacist (Rheumatology and Pulmonology)

## 2023-02-22 NOTE — Telephone Encounter (Signed)
 Patient is new start to pirfenidone (per protocol dosing)  Submitted a Prior Authorization request to [facility] for PIRFENIDONE via CoverMyMeds. Will update once we receive a response.  Key: [id number]

## 2023-02-24 ENCOUNTER — Other Ambulatory Visit (HOSPITAL_COMMUNITY): Payer: Self-pay

## 2023-02-24 NOTE — Telephone Encounter (Signed)
 Received notification from [facility] regarding a prior authorization for PIRFENIDONE. Authorization has been APPROVED from 02/22/2023 to 01/05/2024. Approval letter sent to scan center.  Per test claim, copay for 30 days supply is $100  Patient can fill through [facility]: [phone]   Attempted to enroll patient into PAF grant but they need additional information for income verification. Patient and MDO will be receiving additional communication

## 2023-02-25 ENCOUNTER — Other Ambulatory Visit (HOSPITAL_COMMUNITY): Payer: Self-pay

## 2023-02-25 ENCOUNTER — Other Ambulatory Visit: Payer: Self-pay

## 2023-02-25 MED ORDER — PIRFENIDONE 267 MG PO TABS
ORAL_TABLET | ORAL | 0 refills | Status: DC
  Filled 2023-03-03: qty 270, 30d supply, fill #0

## 2023-02-25 MED ORDER — PIRFENIDONE 267 MG PO TABS
801.0000 mg | ORAL_TABLET | Freq: Three times a day (TID) | ORAL | 4 refills | Status: DC
  Filled 2023-02-25 – 2023-04-05 (×2): qty 270, 30d supply, fill #0
  Filled 2023-04-27: qty 270, 30d supply, fill #1
  Filled 2023-06-03: qty 270, 30d supply, fill #2
  Filled 2023-07-05: qty 270, 30d supply, fill #3

## 2023-02-25 NOTE — Telephone Encounter (Signed)
 Patient's wife counseled on purpose, proper use, and potential adverse effects including nausea, vomiting, abdominal pain, GERD, weight loss, arthralgia, dizziness, and suns sensitivity/rash.  Stressed the importance of routine lab monitoring. Will monitor LFT's every month for the first 6 months of treatment then every 3 months. Will monitor CBC every 3 months.  Starting dose will be Esbriet 267 mg 1 tablet three times daily for 7 days, then 2 tablets three times daily for 7 days, then 3 tablets three times daily.  Maintenance dose will be 801 mg 1 tablet three times daily if tolerated.  Stressed the importance of taking with meals to minimize stomach upset.  Rx sent to [facility]. Patient ware of $100 copay. He does not qualify for [facility] [place] based on their household income of 2.    Chesley Mires, PharmD, MPH, BCPS, CPP Clinical Pharmacist (Rheumatology and Pulmonology)

## 2023-02-26 ENCOUNTER — Other Ambulatory Visit: Payer: Self-pay | Admitting: Pharmacist

## 2023-02-26 NOTE — Progress Notes (Signed)
 Patient's wife counseled on purpose, proper use, and potential adverse effects including nausea, vomiting, abdominal pain, GERD, weight loss, arthralgia, dizziness, and suns sensitivity/rash.  Stressed the importance of routine lab monitoring. Will monitor LFT's every month for the first 6 months of treatment then every 3 months. Will monitor CBC every 3 months.  Starting dose will be Esbriet 267 mg 1 tablet three times daily for 7 days, then 2 tablets three times daily for 7 days, then 3 tablets three times daily.  Maintenance dose will be 801 mg 1 tablet three times daily if tolerated.  Stressed the importance of taking with meals to minimize stomach upset.  Rx sent to [facility]. Patient ware of $100 copay. He does not qualify for [facility] [place] based on their household income of 2.    Chesley Mires, PharmD, MPH, BCPS, CPP Clinical Pharmacist (Rheumatology and Pulmonology)

## 2023-03-03 ENCOUNTER — Other Ambulatory Visit: Payer: Self-pay

## 2023-03-03 NOTE — Progress Notes (Signed)
 Specialty Pharmacy Initial Fill Coordination Note  Jose Hines. is a 78 y.o. male contacted today regarding initial fill of specialty medication(s) Pirfenidone   Patient requested Delivery   Delivery date: 03/05/23   Verified address: [address]   [place] [address]   Medication will be filled on 03/04/23.   Patient is aware of $100 copayment. Payment info collected and forwarded to [place] at [facility].

## 2023-03-04 ENCOUNTER — Other Ambulatory Visit (HOSPITAL_COMMUNITY): Payer: Self-pay

## 2023-03-04 ENCOUNTER — Other Ambulatory Visit: Payer: Self-pay

## 2023-03-05 ENCOUNTER — Other Ambulatory Visit (HOSPITAL_COMMUNITY): Payer: Self-pay

## 2023-03-09 ENCOUNTER — Encounter: Payer: Self-pay | Admitting: Cardiology

## 2023-03-09 ENCOUNTER — Other Ambulatory Visit (HOSPITAL_COMMUNITY): Payer: Self-pay

## 2023-03-09 ENCOUNTER — Encounter: Payer: Self-pay | Admitting: Internal Medicine

## 2023-03-09 DIAGNOSIS — I429 Cardiomyopathy, unspecified: Secondary | ICD-10-CM | POA: Diagnosis not present

## 2023-03-09 DIAGNOSIS — E7800 Pure hypercholesterolemia, unspecified: Secondary | ICD-10-CM | POA: Diagnosis not present

## 2023-03-09 DIAGNOSIS — M109 Gout, unspecified: Secondary | ICD-10-CM | POA: Diagnosis not present

## 2023-03-09 DIAGNOSIS — K219 Gastro-esophageal reflux disease without esophagitis: Secondary | ICD-10-CM | POA: Diagnosis not present

## 2023-03-09 DIAGNOSIS — I2510 Atherosclerotic heart disease of native coronary artery without angina pectoris: Secondary | ICD-10-CM

## 2023-03-09 DIAGNOSIS — I5020 Unspecified systolic (congestive) heart failure: Secondary | ICD-10-CM | POA: Diagnosis not present

## 2023-03-09 DIAGNOSIS — I10 Essential (primary) hypertension: Secondary | ICD-10-CM | POA: Diagnosis not present

## 2023-03-09 DIAGNOSIS — Z0000 Encounter for general adult medical examination without abnormal findings: Secondary | ICD-10-CM | POA: Diagnosis not present

## 2023-03-09 DIAGNOSIS — F3341 Major depressive disorder, recurrent, in partial remission: Secondary | ICD-10-CM | POA: Diagnosis not present

## 2023-03-09 DIAGNOSIS — I5022 Chronic systolic (congestive) heart failure: Secondary | ICD-10-CM

## 2023-03-09 DIAGNOSIS — R001 Bradycardia, unspecified: Secondary | ICD-10-CM | POA: Diagnosis not present

## 2023-03-10 ENCOUNTER — Other Ambulatory Visit: Payer: Self-pay

## 2023-03-10 ENCOUNTER — Other Ambulatory Visit (HOSPITAL_COMMUNITY): Payer: Self-pay

## 2023-03-23 ENCOUNTER — Encounter: Payer: Self-pay | Admitting: Cardiology

## 2023-03-23 ENCOUNTER — Other Ambulatory Visit: Payer: Self-pay

## 2023-03-23 ENCOUNTER — Ambulatory Visit: Attending: Cardiology | Admitting: Cardiology

## 2023-03-23 VITALS — BP 136/83 | HR 83 | Resp 16 | Ht 71.0 in | Wt 221.6 lb

## 2023-03-23 DIAGNOSIS — R0609 Other forms of dyspnea: Secondary | ICD-10-CM

## 2023-03-23 DIAGNOSIS — I25118 Atherosclerotic heart disease of native coronary artery with other forms of angina pectoris: Secondary | ICD-10-CM

## 2023-03-23 DIAGNOSIS — I5020 Unspecified systolic (congestive) heart failure: Secondary | ICD-10-CM

## 2023-03-23 DIAGNOSIS — E782 Mixed hyperlipidemia: Secondary | ICD-10-CM | POA: Diagnosis not present

## 2023-03-23 DIAGNOSIS — F1010 Alcohol abuse, uncomplicated: Secondary | ICD-10-CM

## 2023-03-23 MED ORDER — EZETIMIBE 10 MG PO TABS: 10.0000 mg | ORAL_TABLET | Freq: Every day | ORAL | 3 refills | Status: DC

## 2023-03-23 NOTE — Patient Instructions (Signed)
 Medication Instructions:  Increase metoprolol succinate to 25 mg by mouth daily  Start Zetia 10 mg by mouth daily  Stop Rosuvastatin for 3 weeks and then start lower dose of Rosuvastatin 10 mg by mouth daily  *If you need a refill on your cardiac medications before your next appointment, please call your pharmacy*   Lab Work: none If you have labs (blood work) drawn today and your tests are completely normal, you will receive your results only by: MyChart Message (if you have MyChart) OR A paper copy in the mail If you have any lab test that is abnormal or we need to change your treatment, we will call you to review the results.   Testing/Procedures: Your physician has requested that you have an echocardiogram. Echocardiography is a painless test that uses sound waves to create images of your heart. It provides your doctor with information about the size and shape of your heart and how well your heart's chambers and valves are working. This procedure takes approximately one hour. There are no restrictions for this procedure. Please do NOT wear cologne, perfume, aftershave, or lotions (deodorant is allowed). Please arrive 15 minutes prior to your appointment time.  Please note: We ask at that you not bring children with you during ultrasound (echo/ vascular) testing. Due to room size and safety concerns, children are not allowed in the ultrasound rooms during exams. Our front office staff cannot provide observation of children in our lobby area while testing is being conducted. An adult accompanying a patient to their appointment will only be allowed in the ultrasound room at the discretion of the ultrasound technician under special circumstances. We apologize for any inconvenience.    Follow-Up: At [facility], you and your health needs are our priority.  As part of our continuing mission to provide you with exceptional heart care, we have created designated Provider Care Teams.   These Care Teams include your primary Cardiologist (physician) and Advanced Practice Providers (APPs -  Physician Assistants and Nurse Practitioners) who all work together to provide you with the care you need, when you need it.  We recommend signing up for the patient portal called "MyChart".  Sign up information is provided on this After Visit Summary.  MyChart is used to connect with patients for Virtual Visits (Telemedicine).  Patients are able to view lab/test results, encounter notes, upcoming appointments, etc.  Non-urgent messages can be sent to your provider as well.   To learn more about what you can do with MyChart, go to [email or web address].    Your next appointment:   3 month(s)  Provider:   Yates Decamp, MD     Other Instructions

## 2023-03-23 NOTE — Progress Notes (Unsigned)
 Cardiology Office Note:  .   Date:  03/24/2023  ID:  Jose Fujita., DOB 06/26/1945, MRN [id number] PCP: Tally Joe, MD  [facility] Providers Cardiologist:  Yates Decamp, MD   History of Present Illness: .   Jose Jensen Kilburg. is a 78 y.o. Caucasian patient with hypercholesterolemia, primary hypertension, OSA on CPAP, COPD with mild obstructive disease, alcoholic peripheral neuropathy and has reduced alcohol intake significantly over the past 2 years, coronary artery disease by cardiac catheterization performed due to abnormal stress test and dyspnea on exertion on 11/19/2022 with occluded RCA and heavily calcified proximal and mid LAD with severe LV systolic dysfunction at 30 to 01% and recommended consideration for CABG. 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  Is also being evaluated for COPD/pulmonary fibrosis and fitness for CABG.  Discussed the use of AI scribe software for clinical note transcription with the patient, who gave verbal consent to proceed.  History of Present Illness   The patient, with a history of heart disease, pulmonary fibrosis, and high cholesterol, presents with complaints of decreased energy, shortness of breath, and leg pain. The leg pain is constant and affects the patient's mobility, making it difficult to rise from a seated position. The pain is located from the knee down and is present even upon waking in the morning. The patient also reports a persistent cough, described as an irritation in the throat, with no expectoration. The cough has been present for approximately three to four weeks. The patient's spouse notes that the patient's energy levels have decreased and that the patient has difficulty with mobility due to leg pain.      Labs   Lab Results  Component Value Date   CHOL 137 11/27/2022   HDL 46 11/27/2022   LDLCALC 62 11/27/2022   TRIG 175 (H) 11/27/2022   CHOLHDL 3.0 11/27/2022   Lab Results   Component Value Date   NA 140 12/21/2022   K 4.1 12/21/2022   CO2 24 12/21/2022   GLUCOSE 150 (H) 12/21/2022   BUN 14 12/21/2022   CREATININE 0.80 12/21/2022   CALCIUM 9.6 12/21/2022   EGFR 91 12/21/2022   GFRNONAA >60 03/28/2020      Latest Ref Rng & Units 12/21/2022   12:00 AM 11/27/2022    4:19 PM 11/19/2022   12:34 PM  BMP  Glucose 70 - 99 mg/dL 027  253    BUN 8 - 27 mg/dL 14  13    Creatinine 6.64 - 1.27 mg/dL 4.03  4.74    BUN/Creat Ratio 10 - 24 18  17     Sodium 134 - 144 mmol/L 140  140  142    140   Potassium 3.5 - 5.2 mmol/L 4.1  4.2  3.5    3.7   Chloride 96 - 106 mmol/L 99  97    CO2 20 - 29 mmol/L 24  27    Calcium 8.6 - 10.2 mg/dL 9.6  9.7        Latest Ref Rng & Units 12/21/2022    2:20 PM 11/19/2022   12:34 PM 11/13/2022    3:39 PM  CBC  WBC 4.0 - 10.5 K/uL 10.5   11.0   Hemoglobin 13.0 - 17.0 g/dL 25.9  56.3    87.5  64.3   Hematocrit 39.0 - 52.0 % 43.8  36.0    38.0  38.2   Platelets 150.0 - 400.0 K/uL 263.0   206  Lab Results  Component Value Date   HGBA1C 6.6 (H) 01/26/2023    Lab Results  Component Value Date   TSH 1.610 11/13/2022    External Labs:  K PN PCP labs 03/09/2023:  Total cholesterol 149, triglycerides 160, HDL 55, LDL 67.  TSH normal at 2.090  Labs 01/26/2023:  A1c 6.6%.  Review of Systems  Constitutional: Positive for malaise/fatigue.  Cardiovascular:  Positive for dyspnea on exertion. Negative for chest pain and leg swelling.   Physical Exam:   VS:  BP 136/83 (BP Location: Left Arm, Patient Position: Sitting, Cuff Size: Large)   Pulse 83   Resp 16   Ht 5\' 11"  (1.803 m)   Wt 221 lb 9.6 oz (100.5 kg)   SpO2 96%   BMI 30.91 kg/m    Wt Readings from Last 3 Encounters:  03/23/23 221 lb 9.6 oz (100.5 kg)  02/18/23 226 lb 9.6 oz (102.8 kg)  01/21/23 219 lb (99.3 kg)    Physical Exam Neck:     Vascular: No carotid bruit or JVD.  Cardiovascular:     Rate and Rhythm: Normal rate and regular rhythm.      Pulses: Intact distal pulses.     Heart sounds: Normal heart sounds. No murmur heard.    No gallop.  Pulmonary:     Effort: Pulmonary effort is normal. Prolonged expiration present.     Breath sounds: Examination of the right-middle field reveals rales. Examination of the right-lower field reveals rales. Examination of the left-lower field reveals rales. Rales present.  Abdominal:     General: Bowel sounds are normal.     Palpations: Abdomen is soft.  Musculoskeletal:     Right lower leg: No edema.     Left lower leg: No edema.    Studies Reviewed: .    Echocardiogram 10/29/2022:  1. Left ventricular ejection fraction, by estimation, is 30 to 35%. The left ventricle has moderately decreased function. The left ventricle demonstrates global hypokinesis. The left ventricular internal cavity size was moderately to severely dilated. Left ventricular diastolic parameters are consistent with Grade II diastolic dysfunction (pseudonormalization). The average left ventricular global longitudinal strain is -14.1 %. The global longitudinal strain is abnormal.  CARDIAC CATHETERIZATION 11/19/2022  Severe, heavily calcified stenosis throughout the proximal LAD. There is a moderate caliber Diagonal branch arising from the mid LAD. Mild non-obstructive plaque in the proximal Circumflex Large dominant RCA with dense calcification throughout the proximal and mid vessel. The vessel could not be selectively engaged but non-selective angiography demonstrates chronic total occlusion of the proximal vessel. The distal RCA and the PDA fills from left to right collaterals.     EKG:          Medications and allergies    No Known Allergies   Current Outpatient Medications:    albuterol (PROVENTIL HFA;VENTOLIN HFA) 108 (90 Base) MCG/ACT inhaler, Inhale 1-2 puffs into the lungs every 6 (six) hours as needed for wheezing or shortness of breath., Disp: 1 Inhaler, Rfl: 0   albuterol (PROVENTIL) (2.5 MG/3ML)  0.083% nebulizer solution, Take 3 mLs (2.5 mg total) by nebulization every 6 (six) hours as needed for wheezing or shortness of breath., Disp: 75 mL, Rfl: 12   allopurinol (ZYLOPRIM) 300 MG tablet, Take 300 mg by mouth daily., Disp: , Rfl:    aspirin 81 MG tablet, Take 81 mg by mouth daily., Disp: , Rfl:    budesonide (PULMICORT) 0.5 MG/2ML nebulizer solution, INHALE TWO MILLILITERS VIA NEBULIZATION BY MOUTH TWICE  A DAY, Disp: 120 mL, Rfl: 0   buPROPion (WELLBUTRIN XL) 150 MG 24 hr tablet, Take 1 tablet by mouth every morning., Disp: , Rfl:    esomeprazole (NEXIUM) 40 MG capsule, TAKE ONE CAPSULE BY MOUTH DAILY IN THE MORNING (Patient taking differently: Take 40 mg by mouth daily.), Disp: 30 capsule, Rfl: 0   ezetimibe (ZETIA) 10 MG tablet, Take 1 tablet (10 mg total) by mouth daily., Disp: 90 tablet, Rfl: 3   fluticasone (FLONASE) 50 MCG/ACT nasal spray, Place 1 spray into both nostrils daily., Disp: 16 g, Rfl: 2   furosemide (LASIX) 40 MG tablet, Take 1 tablet (40 mg total) by mouth daily., Disp: 90 tablet, Rfl: 3   Glucosamine HCl 1500 MG TABS, Take 1 tablet by mouth daily., Disp: , Rfl:    magnesium oxide (MAG-OX) 400 MG tablet, Take 1 tablet (400 mg total) by mouth daily., Disp: 30 tablet, Rfl: 3   Multiple Vitamins-Iron (MULTIVITAMINS WITH IRON) TABS, Take 1 tablet by mouth daily., Disp: , Rfl:    Pirfenidone 267 MG TABS, Take 3 tablets (801 mg total) by mouth 3 (three) times daily with meals., Disp: 270 tablet, Rfl: 4   sacubitril-valsartan (ENTRESTO) 49-51 MG, Take 1 tablet by mouth 2 (two) times daily., Disp: 60 tablet, Rfl: 6   metoprolol succinate (TOPROL-XL) 25 MG 24 hr tablet, Take 1 tablet (25 mg total) by mouth daily., Disp: , Rfl:    rosuvastatin (CRESTOR) 20 MG tablet, Take 0.5 tablets (10 mg total) by mouth daily., Disp: , Rfl:    ASSESSMENT AND PLAN: .      ICD-10-CM   1. 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 HFrEF (heart failure with reduced ejection fraction) (HCC)  I50.20 metoprolol succinate (TOPROL-XL) 25 MG 24 hr tablet    ECHOCARDIOGRAM COMPLETE    3. Alcohol abuse  F10.10 ECHOCARDIOGRAM COMPLETE    4. Dyspnea on exertion  R06.09 ECHOCARDIOGRAM COMPLETE    5. Mixed hyperlipidemia  E78.2 rosuvastatin (CRESTOR) 20 MG tablet    ezetimibe (ZETIA) 10 MG tablet     Assessment and Plan    Coronary Artery Disease (CAD) with Reduced Ejection Fraction   He has coronary artery disease with a reduced ejection fraction of 30-35%. Cardiac catheterization showed two (occluded RCA and high-grade proximal long segment calcific stenosis) blocked arteries. Managed with medical therapy, including cholesterol-lowering medications. Bypass surgery is moderately high risk due to pulmonary fibrosis and overall health, so medical management is preferred as he is well-managed and asymptomatic. Medical therapy can improve outcomes, though lifespan may be reduced.   Also there is question whether his LVEF reduction may have been contributed by alcohol.  Increase metoprolol succinate to 25 mg daily and monitor heart rate. Order echocardiogram to assess heart function. Encourage regular physical activity. Advise cessation of alcohol consumption.  HFrEF Will recheck his echocardiogram. Presently on maximum tolerated dose of Entresto at 49/51 mg daily and metoprolol succinate 25 mg daily, was recently reduced to 12.5 mg due to bradycardia although patient asymptomatic.  Will increase it back to 25 mg daily.  Leg weakness and fatigue Hold rosuvastatin for two weeks, then restart at 10 mg daily. Add ezetimibe 10 mg daily.   Pulmonary Fibrosis  Diagnosed with pulmonary fibrosis, contributing to dyspnea and reduced energy. Anesthesia and cardiac  surgery could worsen the condition, leading to a flare-up and potentially shortening lifespan. Already experiencing dyspnea with activity, further deterioration could significantly impact quality of life. Not a candidate for lung transplant due to heart disease. Continue current medications for IPF as prescribed by Dr. Marchelle Gearing. Encourage regular physical activity to improve circulation and overall health.  Evaluated by Dr. Marchelle Gearing no being treated for ILD "Rarely anesthesia after cardiac surgery in the setting of IPF can cause IPF flareup. However I think his life expectancy is shortened with his systolic heart failure currently that is more of a problem than his fibrosis. But the combined is reducing his life expectancy. " "The risk is not prohibitive but moderate high for prolonged ventilator dependence after cardiac surgery. "  Hyperlipidemia   Cholesterol levels are well-controlled with current medication, but he experiences myalgia likely due to rosuvastatin. Adjust medication regimen to alleviate side effects while maintaining cholesterol control. Hold rosuvastatin for two weeks, then restart at 10 mg daily. Add ezetimibe 10 mg daily. Recheck cholesterol levels in 3-6 months.  Follow-up   Ongoing monitoring of cardiac and pulmonary conditions is required. Schedule follow-up appointment in 3 months. Monitor heart rate and oxygen levels at home. Communicate with Dr. Azucena Cecil regarding the rationale for reducing metoprolol dose.  He was told to have low heart rate, as long as he is asymptomatic, would like to continue maximum tolerated beta-blocker dose.  Although has occluded dominant RCA and high-grade stenosis in the LAD, in the absence of angina, heart failure symptoms, dyspnea can be attributed to his underlying IPF.  Hence recommending medical therapy for now.  His wife is present and all questions answered. It was a 40-minute office visit encounter.  I reviewed the notes from Dr. Marchelle Gearing,  reviewed his labs.  Signed,  Yates Decamp, MD, [facility] 03/24/2023, 10:35 PM [facility] [address] [place], [place] [address] Phone: [phone]. Fax:  [phone]

## 2023-03-31 DIAGNOSIS — G4733 Obstructive sleep apnea (adult) (pediatric): Secondary | ICD-10-CM | POA: Diagnosis not present

## 2023-03-31 DIAGNOSIS — Z1330 Encounter for screening examination for mental health and behavioral disorders, unspecified: Secondary | ICD-10-CM | POA: Diagnosis not present

## 2023-03-31 DIAGNOSIS — E66811 Obesity, class 1: Secondary | ICD-10-CM | POA: Diagnosis not present

## 2023-04-02 ENCOUNTER — Other Ambulatory Visit: Payer: Self-pay

## 2023-04-05 ENCOUNTER — Other Ambulatory Visit: Payer: Self-pay | Admitting: Pharmacy Technician

## 2023-04-05 ENCOUNTER — Other Ambulatory Visit: Payer: Self-pay

## 2023-04-05 NOTE — Progress Notes (Signed)
 Specialty Pharmacy Refill Coordination Note  [name]. is a 78 y.o. male contacted today regarding refills of specialty medication(s) Pirfenidone   Patient requested Delivery   Delivery date: 04/09/23   Verified address: [address] [place]   Medication will be filled on 04/08/23.

## 2023-04-08 ENCOUNTER — Other Ambulatory Visit: Payer: Self-pay

## 2023-04-12 ENCOUNTER — Encounter: Payer: Self-pay | Admitting: Cardiology

## 2023-04-12 ENCOUNTER — Ambulatory Visit: Payer: Medicare PPO | Admitting: Cardiology

## 2023-04-13 ENCOUNTER — Encounter: Payer: Self-pay | Admitting: Internal Medicine

## 2023-04-13 DIAGNOSIS — Z5181 Encounter for therapeutic drug level monitoring: Secondary | ICD-10-CM

## 2023-04-13 DIAGNOSIS — J84112 Idiopathic pulmonary fibrosis: Secondary | ICD-10-CM

## 2023-04-14 DIAGNOSIS — G4733 Obstructive sleep apnea (adult) (pediatric): Secondary | ICD-10-CM | POA: Diagnosis not present

## 2023-04-15 NOTE — Telephone Encounter (Signed)
 As counseled before and apologize if this has not been understood before  -Pirfenidone (Esbriet) does not make anyone feel better.  He does not improve upon the condition of fibrosis.  It only prevents it from getting worse   - If he is tolerating it without any side effects then that is a plus  -Needs liver function test within 1 month of starting it-and then every month for the next 4 months and then every what 3 months

## 2023-04-17 ENCOUNTER — Other Ambulatory Visit: Payer: Self-pay | Admitting: Physician Assistant

## 2023-04-20 ENCOUNTER — Encounter: Payer: Self-pay | Admitting: Cardiology

## 2023-04-20 ENCOUNTER — Ambulatory Visit (HOSPITAL_COMMUNITY): Attending: Cardiology

## 2023-04-20 DIAGNOSIS — I25118 Atherosclerotic heart disease of native coronary artery with other forms of angina pectoris: Secondary | ICD-10-CM | POA: Diagnosis not present

## 2023-04-20 DIAGNOSIS — F1010 Alcohol abuse, uncomplicated: Secondary | ICD-10-CM | POA: Diagnosis not present

## 2023-04-20 DIAGNOSIS — I340 Nonrheumatic mitral (valve) insufficiency: Secondary | ICD-10-CM

## 2023-04-20 DIAGNOSIS — I5020 Unspecified systolic (congestive) heart failure: Secondary | ICD-10-CM | POA: Diagnosis not present

## 2023-04-20 DIAGNOSIS — R0609 Other forms of dyspnea: Secondary | ICD-10-CM | POA: Insufficient documentation

## 2023-04-20 LAB — ECHOCARDIOGRAM COMPLETE
Area-P 1/2: 4.6 cm2
MV M vel: 4.61 m/s
MV Peak grad: 85 mmHg
Radius: 0.7 cm
S' Lateral: 5.8 cm

## 2023-04-20 NOTE — Progress Notes (Signed)
 Jose Hines is 78 years of age and has severe IPF and severe coronary artery disease, not a candidate for CABG in view of underlying comorbidities.  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  Can you please review and see what you think, I will send a formal referral.  [name] please refer to Dr. Lorie Rook

## 2023-04-21 ENCOUNTER — Other Ambulatory Visit: Payer: Self-pay

## 2023-04-23 ENCOUNTER — Encounter (HOSPITAL_COMMUNITY): Payer: Self-pay | Admitting: *Deleted

## 2023-04-23 ENCOUNTER — Other Ambulatory Visit: Payer: Self-pay

## 2023-04-23 ENCOUNTER — Ambulatory Visit
Admission: RE | Admit: 2023-04-23 | Discharge: 2023-04-23 | Disposition: A | Discharge status: Home / Self Care | Source: Ambulatory Visit | Attending: Physician Assistant

## 2023-04-23 ENCOUNTER — Observation Stay (HOSPITAL_COMMUNITY)
Admission: EM | Admit: 2023-04-23 | Discharge: 2023-04-25 | Disposition: A | Discharge status: Home / Self Care | Attending: Internal Medicine | Admitting: Internal Medicine

## 2023-04-23 ENCOUNTER — Ambulatory Visit: Payer: Self-pay | Admitting: Internal Medicine

## 2023-04-23 ENCOUNTER — Inpatient Hospital Stay (HOSPITAL_COMMUNITY)

## 2023-04-23 ENCOUNTER — Emergency Department (HOSPITAL_COMMUNITY)

## 2023-04-23 VITALS — BP 107/63 | HR 79 | Temp 98.1°F | Resp 22 | Ht 71.0 in | Wt 220.0 lb

## 2023-04-23 DIAGNOSIS — J9601 Acute respiratory failure with hypoxia: Secondary | ICD-10-CM | POA: Diagnosis not present

## 2023-04-23 DIAGNOSIS — I340 Nonrheumatic mitral (valve) insufficiency: Secondary | ICD-10-CM | POA: Diagnosis not present

## 2023-04-23 DIAGNOSIS — R7981 Abnormal blood-gas level: Secondary | ICD-10-CM

## 2023-04-23 DIAGNOSIS — R059 Cough, unspecified: Secondary | ICD-10-CM | POA: Diagnosis not present

## 2023-04-23 DIAGNOSIS — Z1152 Encounter for screening for COVID-19: Secondary | ICD-10-CM | POA: Diagnosis not present

## 2023-04-23 DIAGNOSIS — Z79899 Other long term (current) drug therapy: Secondary | ICD-10-CM | POA: Insufficient documentation

## 2023-04-23 DIAGNOSIS — R0602 Shortness of breath: Secondary | ICD-10-CM | POA: Diagnosis not present

## 2023-04-23 DIAGNOSIS — I2510 Atherosclerotic heart disease of native coronary artery without angina pectoris: Secondary | ICD-10-CM | POA: Diagnosis not present

## 2023-04-23 DIAGNOSIS — R9431 Abnormal electrocardiogram [ECG] [EKG]: Secondary | ICD-10-CM | POA: Diagnosis not present

## 2023-04-23 DIAGNOSIS — R918 Other nonspecific abnormal finding of lung field: Secondary | ICD-10-CM | POA: Diagnosis not present

## 2023-04-23 DIAGNOSIS — J441 Chronic obstructive pulmonary disease with (acute) exacerbation: Secondary | ICD-10-CM | POA: Diagnosis not present

## 2023-04-23 DIAGNOSIS — J8410 Pulmonary fibrosis, unspecified: Secondary | ICD-10-CM | POA: Diagnosis not present

## 2023-04-23 DIAGNOSIS — Z87891 Personal history of nicotine dependence: Secondary | ICD-10-CM | POA: Diagnosis not present

## 2023-04-23 DIAGNOSIS — I10 Essential (primary) hypertension: Secondary | ICD-10-CM | POA: Diagnosis present

## 2023-04-23 DIAGNOSIS — G4733 Obstructive sleep apnea (adult) (pediatric): Secondary | ICD-10-CM | POA: Diagnosis not present

## 2023-04-23 DIAGNOSIS — Z7982 Long term (current) use of aspirin: Secondary | ICD-10-CM | POA: Insufficient documentation

## 2023-04-23 DIAGNOSIS — I5042 Chronic combined systolic (congestive) and diastolic (congestive) heart failure: Secondary | ICD-10-CM | POA: Diagnosis present

## 2023-04-23 DIAGNOSIS — J84112 Idiopathic pulmonary fibrosis: Secondary | ICD-10-CM | POA: Diagnosis not present

## 2023-04-23 DIAGNOSIS — I110 Hypertensive heart disease with heart failure: Secondary | ICD-10-CM | POA: Insufficient documentation

## 2023-04-23 DIAGNOSIS — R140 Abdominal distension (gaseous): Secondary | ICD-10-CM | POA: Diagnosis not present

## 2023-04-23 HISTORY — DX: Nonrheumatic mitral (valve) insufficiency: I34.0

## 2023-04-23 HISTORY — DX: Obstructive sleep apnea (adult) (pediatric): G47.33

## 2023-04-23 HISTORY — DX: Chronic combined systolic (congestive) and diastolic (congestive) heart failure: I50.42

## 2023-04-23 HISTORY — DX: Idiopathic pulmonary fibrosis: J84.112

## 2023-04-23 LAB — TROPONIN I (HIGH SENSITIVITY)
Troponin I (High Sensitivity): 28 ng/L — ABNORMAL HIGH (ref <18)
Troponin I (High Sensitivity): 32 ng/L — ABNORMAL HIGH (ref <18)

## 2023-04-23 LAB — COMPREHENSIVE METABOLIC PANEL WITH GFR
ALT: 16 U/L (ref 0–44)
AST: 19 U/L (ref 15–41)
Albumin: 3.6 g/dL (ref 3.5–5.0)
Alkaline Phosphatase: 85 U/L (ref 38–126)
Anion gap: 11 (ref 5–15)
BUN: 8 mg/dL (ref 8–23)
CO2: 23 mmol/L (ref 22–32)
Calcium: 9.2 mg/dL (ref 8.9–10.3)
Chloride: 104 mmol/L (ref 98–111)
Creatinine, Ser: 0.73 mg/dL (ref 0.61–1.24)
GFR, Estimated: >60 mL/min (ref >60)
Glucose, Bld: 110 mg/dL — ABNORMAL HIGH (ref 70–99)
Potassium: 3.9 mmol/L (ref 3.5–5.1)
Sodium: 138 mmol/L (ref 135–145)
Total Bilirubin: 1.2 mg/dL (ref 0.0–1.2)
Total Protein: 7 g/dL (ref 6.5–8.1)

## 2023-04-23 LAB — CBC WITH DIFFERENTIAL/PLATELET
Abs Immature Granulocytes: 0.03 10*3/uL (ref 0.00–0.07)
Basophils Absolute: 0.1 10*3/uL (ref 0.0–0.1)
Basophils Relative: 1 %
Eosinophils Absolute: 1.1 10*3/uL — ABNORMAL HIGH (ref 0.0–0.5)
Eosinophils Relative: 11 %
HCT: 43.2 % (ref 39.0–52.0)
Hemoglobin: 15 g/dL (ref 13.0–17.0)
Immature Granulocytes: 0 %
Lymphocytes Relative: 7 %
Lymphs Abs: 0.7 10*3/uL (ref 0.7–4.0)
MCH: 36.1 pg — ABNORMAL HIGH (ref 26.0–34.0)
MCHC: 34.7 g/dL (ref 30.0–36.0)
MCV: 104.1 fL — ABNORMAL HIGH (ref 80.0–100.0)
Monocytes Absolute: 0.8 10*3/uL (ref 0.1–1.0)
Monocytes Relative: 9 %
Neutro Abs: 6.9 10*3/uL (ref 1.7–7.7)
Neutrophils Relative %: 72 %
Platelets: 203 10*3/uL (ref 150–400)
RBC: 4.15 MIL/uL — ABNORMAL LOW (ref 4.22–5.81)
RDW: 13 % (ref 11.5–15.5)
WBC: 9.6 10*3/uL (ref 4.0–10.5)
nRBC: 0 % (ref 0.0–0.2)

## 2023-04-23 LAB — RESP PANEL BY RT-PCR (RSV, FLU A&B, COVID)  RVPGX2
Influenza A by PCR: NEGATIVE
Influenza B by PCR: NEGATIVE
Resp Syncytial Virus by PCR: NEGATIVE
SARS Coronavirus 2 by RT PCR: NEGATIVE

## 2023-04-23 LAB — BRAIN NATRIURETIC PEPTIDE: B Natriuretic Peptide: 707.9 pg/mL — ABNORMAL HIGH (ref 0.0–100.0)

## 2023-04-23 MED ORDER — ONDANSETRON HCL 4 MG/2ML IJ SOLN: 4.0000 mg | Freq: Four times a day (QID) | INTRAMUSCULAR | Status: DC | PRN

## 2023-04-23 MED ORDER — POTASSIUM CHLORIDE CRYS ER 20 MEQ PO TBCR
20.0000 meq | EXTENDED_RELEASE_TABLET | Freq: Once | ORAL | Status: AC
  Administered 2023-04-23: 20 meq via ORAL
  Filled 2023-04-23: qty 1

## 2023-04-23 MED ORDER — METOPROLOL SUCCINATE ER 25 MG PO TB24
25.0000 mg | ORAL_TABLET | Freq: Every day | ORAL | Status: DC
  Administered 2023-04-24 – 2023-04-25 (×2): 25 mg via ORAL
  Filled 2023-04-23 (×2): qty 1

## 2023-04-23 MED ORDER — POLYETHYLENE GLYCOL 3350 17 G PO PACK: 17.0000 g | PACK | Freq: Every day | ORAL | Status: DC | PRN

## 2023-04-23 MED ORDER — PANTOPRAZOLE SODIUM 40 MG PO TBEC
40.0000 mg | DELAYED_RELEASE_TABLET | Freq: Every day | ORAL | Status: DC
  Administered 2023-04-24 – 2023-04-25 (×2): 40 mg via ORAL
  Filled 2023-04-23 (×2): qty 1

## 2023-04-23 MED ORDER — FUROSEMIDE 40 MG PO TABS
40.0000 mg | ORAL_TABLET | Freq: Every day | ORAL | Status: DC
  Administered 2023-04-24 – 2023-04-25 (×2): 40 mg via ORAL
  Filled 2023-04-23 (×2): qty 1

## 2023-04-23 MED ORDER — IPRATROPIUM-ALBUTEROL 0.5-2.5 (3) MG/3ML IN SOLN: 3.0000 mL | RESPIRATORY_TRACT | Status: DC | PRN

## 2023-04-23 MED ORDER — PIRFENIDONE 267 MG PO TABS
801.0000 mg | ORAL_TABLET | Freq: Three times a day (TID) | ORAL | Status: DC
  Administered 2023-04-24 – 2023-04-25 (×4): 801 mg via ORAL
  Filled 2023-04-23 (×6): qty 3

## 2023-04-23 MED ORDER — IPRATROPIUM-ALBUTEROL 0.5-2.5 (3) MG/3ML IN SOLN
3.0000 mL | Freq: Four times a day (QID) | RESPIRATORY_TRACT | Status: DC
  Administered 2023-04-23 – 2023-04-25 (×7): 3 mL via RESPIRATORY_TRACT
  Filled 2023-04-23 (×7): qty 3

## 2023-04-23 MED ORDER — SODIUM CHLORIDE 0.9 % IV SOLN
2.0000 g | Freq: Every day | INTRAVENOUS | Status: DC
  Administered 2023-04-23 – 2023-04-24 (×2): 2 g via INTRAVENOUS
  Filled 2023-04-23 (×2): qty 20

## 2023-04-23 MED ORDER — ROSUVASTATIN CALCIUM 5 MG PO TABS
10.0000 mg | ORAL_TABLET | Freq: Every day | ORAL | Status: DC
Start: 2023-04-24 — End: 2023-04-25
  Administered 2023-04-24 – 2023-04-25 (×2): 10 mg via ORAL
  Filled 2023-04-23 (×2): qty 2

## 2023-04-23 MED ORDER — BUPROPION HCL ER (XL) 150 MG PO TB24
150.0000 mg | ORAL_TABLET | Freq: Every morning | ORAL | Status: DC
  Administered 2023-04-24 – 2023-04-25 (×2): 150 mg via ORAL
  Filled 2023-04-23 (×2): qty 1

## 2023-04-23 MED ORDER — ALBUTEROL (5 MG/ML) CONTINUOUS INHALATION SOLN
10.0000 mg/h | INHALATION_SOLUTION | RESPIRATORY_TRACT | Status: AC
  Filled 2023-04-23: qty 20

## 2023-04-23 MED ORDER — METHYLPREDNISOLONE SODIUM SUCC 125 MG IJ SOLR
125.0000 mg | Freq: Once | INTRAMUSCULAR | Status: AC
  Administered 2023-04-23: 125 mg via INTRAVENOUS
  Filled 2023-04-23: qty 2

## 2023-04-23 MED ORDER — ACETAMINOPHEN 325 MG PO TABS
650.0000 mg | ORAL_TABLET | Freq: Four times a day (QID) | ORAL | Status: DC | PRN
  Administered 2023-04-24 (×2): 650 mg via ORAL
  Filled 2023-04-23 (×2): qty 2

## 2023-04-23 MED ORDER — ALLOPURINOL 300 MG PO TABS
300.0000 mg | ORAL_TABLET | Freq: Every day | ORAL | Status: DC
  Administered 2023-04-24 – 2023-04-25 (×2): 300 mg via ORAL
  Filled 2023-04-23 (×2): qty 1

## 2023-04-23 MED ORDER — ASPIRIN 81 MG PO CHEW
81.0000 mg | CHEWABLE_TABLET | Freq: Every day | ORAL | Status: DC
  Administered 2023-04-24 – 2023-04-25 (×2): 81 mg via ORAL
  Filled 2023-04-23 (×2): qty 1

## 2023-04-23 MED ORDER — ALBUTEROL SULFATE (2.5 MG/3ML) 0.083% IN NEBU
10.0000 mg | INHALATION_SOLUTION | Freq: Once | RESPIRATORY_TRACT | Status: AC
  Administered 2023-04-23: 10 mg via RESPIRATORY_TRACT
  Filled 2023-04-23: qty 12

## 2023-04-23 MED ORDER — ENOXAPARIN SODIUM 40 MG/0.4ML IJ SOSY
40.0000 mg | PREFILLED_SYRINGE | INTRAMUSCULAR | Status: DC
  Administered 2023-04-24 (×2): 40 mg via SUBCUTANEOUS
  Filled 2023-04-23 (×2): qty 0.4

## 2023-04-23 MED ORDER — SACUBITRIL-VALSARTAN 49-51 MG PO TABS
1.0000 | ORAL_TABLET | Freq: Two times a day (BID) | ORAL | Status: DC
  Administered 2023-04-24 – 2023-04-25 (×3): 1 via ORAL
  Filled 2023-04-23 (×5): qty 1

## 2023-04-23 MED ORDER — ONDANSETRON HCL 4 MG PO TABS: 4.0000 mg | ORAL_TABLET | Freq: Four times a day (QID) | ORAL | Status: DC | PRN

## 2023-04-23 MED ORDER — SODIUM CHLORIDE 0.9% FLUSH
3.0000 mL | Freq: Two times a day (BID) | INTRAVENOUS | Status: DC
  Administered 2023-04-23 – 2023-04-25 (×4): 3 mL via INTRAVENOUS

## 2023-04-23 MED ORDER — ACETAMINOPHEN 650 MG RE SUPP: 650.0000 mg | Freq: Four times a day (QID) | RECTAL | Status: DC | PRN

## 2023-04-23 NOTE — ED Provider Notes (Signed)
 [facility] AT [facility] Provider Note   CSN: [id number] Arrival date & time: 04/23/23  1551     History  Chief Complaint  Patient presents with   Shortness of Breath    Jose Hines. is a 78 y.o. male.   Shortness of Breath  This patient is a 78 year old male with a complicated history including pulmonary fibrosis, Idiopathic, history of coronary disease and referred for cardiac bypass, he has had progressive shortness of breath over the last few months.  Evidently the patient had a nuclear stress test in October and had an ejection fraction of 27%, echocardiogram in 2024 showed 30%, he currently takes occasional breathing treatments but has never been formally diagnosed with COPD that he is aware of.  He was a remote smoker many many years ago.  Echocardiogram was repeated approximately 3 days ago and shows mitral valve abnormalities, ejection fraction was 35 to 40%, global hypokinesis, grade 2 diastolic dysfunction there was moderately severe mitral valve regurgitation  Patient was referred for evaluation of this by interventional cardiology  He presents with increasing shortness of breath that occurred last night, he has been generally feeling poorly over the last 2 weeks with general progressive weakness but last night became much more short of breath.  When he showed up to the urgent care today, the patient had an oxygen level that was slightly low at about 89% although he had been up to 94% at home.  He did have a couple nebulized treatments today.  He describes having a slight productive cough, no swelling of the legs, no chest pain or abdominal pain, noted to have multiple PVCs on the monitor.  It was recommended that the patient be transported to the hospital.  His oxygen level was 88% on arrival to the urgent care.    Home Medications Prior to Admission medications   Medication Sig Start Date End Date Taking? Authorizing Provider   albuterol  (PROVENTIL  HFA;VENTOLIN  HFA) 108 (90 Base) MCG/ACT inhaler Inhale 1-2 puffs into the lungs every 6 (six) hours as needed for wheezing or shortness of breath. 12/24/16   Midge Golas, MD  albuterol  (PROVENTIL ) (2.5 MG/3ML) 0.083% nebulizer solution Take 3 mLs (2.5 mg total) by nebulization every 6 (six) hours as needed for wheezing or shortness of breath. 08/02/18   Olalere, Adewale A, MD  allopurinol  (ZYLOPRIM ) 300 MG tablet Take 300 mg by mouth daily.    [provider]  aspirin  81 MG tablet Take 81 mg by mouth daily.    [provider]  budesonide  (PULMICORT ) 0.5 MG/2ML nebulizer solution INHALE TWO MILLILITERS VIA NEBULIZATION BY MOUTH TWICE A DAY 01/02/19   Olalere, Adewale A, MD  buPROPion  (WELLBUTRIN  XL) 150 MG 24 hr tablet Take 1 tablet by mouth every morning.    [provider]  esomeprazole  (NEXIUM ) 40 MG capsule TAKE ONE CAPSULE BY MOUTH DAILY IN THE MORNING Patient taking differently: Take 40 mg by mouth daily. 07/19/14   Aneita Gwendlyn DASEN, MD  ezetimibe  (ZETIA ) 10 MG tablet Take 1 tablet (10 mg total) by mouth daily. 03/23/23 06/21/23  Ladona Heinz, MD  fluticasone  (FLONASE ) 50 MCG/ACT nasal spray Place 1 spray into both nostrils daily. 04/02/17   Sood, Vineet, MD  furosemide  (LASIX ) 40 MG tablet Take 1 tablet (40 mg total) by mouth daily. 11/19/22   Ladona Heinz, MD  Glucosamine HCl 1500 MG TABS Take 1 tablet by mouth daily.    [provider]  magnesium  oxide (MAG-OX)  400 (240 Mg) MG tablet TAKE 1 TABLET BY MOUTH DAILY 04/20/23   Dunn, Dayna N, PA-C  metoprolol  succinate (TOPROL -XL) 25 MG 24 hr tablet Take 1 tablet (25 mg total) by mouth daily. 03/23/23   Ladona Heinz, MD  Multiple Vitamins-Iron (MULTIVITAMINS WITH IRON) TABS Take 1 tablet by mouth daily.    [provider]  Pirfenidone  267 MG TABS Take 3 tablets (801 mg total) by mouth 3 (three) times daily with meals. 02/25/23   Geronimo Amel, MD  rosuvastatin  (CRESTOR ) 20 MG tablet  Take 0.5 tablets (10 mg total) by mouth daily. 03/23/23   Ladona Heinz, MD  sacubitril -valsartan  (ENTRESTO ) 49-51 MG Take 1 tablet by mouth 2 (two) times daily. 01/21/23   Ladona Heinz, MD      Allergies    Patient has no known allergies.    Review of Systems   Review of Systems  Respiratory:  Positive for shortness of breath.   All other systems reviewed and are negative.   Physical Exam Updated Vital Signs BP 113/64 (BP Location: Left Arm)   Pulse 76   Temp 98.7 F (37.1 C) (Oral)   Resp (!) 27   SpO2 92%  Physical Exam Vitals and nursing note reviewed.  Constitutional:      General: He is not in acute distress.    Appearance: He is well-developed.  HENT:     Head: Normocephalic and atraumatic.     Mouth/Throat:     Pharynx: No oropharyngeal exudate.  Eyes:     General: No scleral icterus.       Right eye: No discharge.        Left eye: No discharge.     Conjunctiva/sclera: Conjunctivae normal.     Pupils: Pupils are equal, round, and reactive to light.  Neck:     Thyroid : No thyromegaly.     Vascular: No JVD.  Cardiovascular:     Rate and Rhythm: Normal rate and regular rhythm.     Heart sounds: Murmur heard.     No friction rub. No gallop.  Pulmonary:     Effort: Pulmonary effort is normal. No respiratory distress.     Breath sounds: Wheezing and rales present.  Abdominal:     General: Bowel sounds are normal. There is no distension.     Palpations: Abdomen is soft. There is no mass.     Tenderness: There is no abdominal tenderness.  Musculoskeletal:        General: No tenderness or deformity. Normal range of motion.     Cervical back: Normal range of motion and neck supple.     Right lower leg: No edema.     Left lower leg: No edema.  Lymphadenopathy:     Cervical: No cervical adenopathy.  Skin:    General: Skin is warm and dry.     Findings: No erythema or rash.  Neurological:     Mental Status: He is alert.     Coordination: Coordination normal.   Psychiatric:        Behavior: Behavior normal.     ED Results / Procedures / Treatments   Labs (all labs ordered are listed, but only abnormal results are displayed) Labs Reviewed  CBC WITH DIFFERENTIAL/PLATELET - Abnormal; Notable for the following components:      Result Value   RBC 4.15 (*)    MCV 104.1 (*)    MCH 36.1 (*)    Eosinophils Absolute 1.1 (*)    All other components within  normal limits  COMPREHENSIVE METABOLIC PANEL WITH GFR - Abnormal; Notable for the following components:   Glucose, Bld 110 (*)    All other components within normal limits  BRAIN NATRIURETIC PEPTIDE - Abnormal; Notable for the following components:   B Natriuretic Peptide 707.9 (*)    All other components within normal limits  TROPONIN I (HIGH SENSITIVITY) - Abnormal; Notable for the following components:   Troponin I (High Sensitivity) 28 (*)    All other components within normal limits  TROPONIN I (HIGH SENSITIVITY) - Abnormal; Notable for the following components:   Troponin I (High Sensitivity) 32 (*)    All other components within normal limits  RESP PANEL BY RT-PCR (RSV, FLU A&B, COVID)  [id number]    EKG None  Radiology DG Chest 2 View Result Date: 04/23/2023 CLINICAL DATA:  Shortness of breath and cough EXAM: CHEST - 2 VIEW COMPARISON:  X-ray 11/13/2022. FINDINGS: Hyperinflation. No pneumothorax or edema. There is some linear opacity at the bases likely scar or atelectasis. Tiny effusions. There are some potentially dilated loops of small bowel in the upper abdomen. Please correlate for any specific symptoms and further workup of the abdomen when appropriate such as x-ray or CT. IMPRESSION: Hyperinflation. Linear opacity the bases likely scar or atelectasis. Possible dilated small bowel in the upper abdomen at the edge of the imaging field. Recommend further workup of the abdomen when appropriate with x-ray or CT. Electronically Signed   By: Ranell Bring M.D.   On: 04/23/2023 18:18     Procedures .Critical Care  Performed by: Cleotilde Rogue, MD Authorized by: Cleotilde Rogue, MD   Critical care provider statement:    Critical care time (minutes):  45   Critical care time was exclusive of:  Separately billable procedures and treating other patients   Critical care was necessary to treat or prevent imminent or life-threatening deterioration of the following conditions:  Respiratory failure   Critical care was time spent personally by me on the following activities:  Development of treatment plan with patient or surrogate, discussions with consultants, evaluation of patient's response to treatment, examination of patient, obtaining history from patient or surrogate, review of old charts, re-evaluation of patient's condition, pulse oximetry, ordering and review of radiographic studies, ordering and review of laboratory studies and ordering and performing treatments and interventions   I assumed direction of critical care for this patient from another provider in my specialty: no     Care discussed with: admitting provider   Comments:           Medications Ordered in ED Medications  albuterol  (PROVENTIL ,VENTOLIN ) solution continuous neb (has no administration in time range)  methylPREDNISolone  sodium succinate (SOLU-MEDROL ) 125 mg/2 mL injection 125 mg (125 mg Intravenous Given 04/23/23 1859)    ED Course/ Medical Decision Making/ A&P Clinical Course as of 04/23/23 1932  Fri Apr 23, 2023  1906 D/w Dr. Lavona - agrees that MV issues not causing SOB - will admit to hospitalist for pulmonary toilet, nebs and steroids.   [BM]    Clinical Course User Index [BM] Cleotilde Rogue, MD                                 Medical Decision Making Risk Prescription drug management. Decision regarding hospitalization.    This patient presents to the ED for concern of shortness of breath, this involves an extensive number of treatment options, and is a  complaint that carries  with it a high risk of complications and morbidity.  The differential diagnosis includes pulmonary fibrosis, reactive airway disease, this could also be cardiac related as the patient does have wheezing and a known history of CHF, cardiac wheeze could be a cause, this could be worsening mitral valve dysfunction, there is an audible murmur, either way the patient is ill or appearing and will need to be admitted, he was hypoxic prehospital but at this time is 93 to 94% on room air when he is resting.  When he ambulates he becomes hypoxic   Co morbidities that complicate the patient evaluation  Multiple prior abnormalities including idiopathic pulmonary fibrosis as well as coronary disease and mitral valve disease   Additional history obtained:  Additional history obtained from medical record External records from outside source obtained and reviewed including medical record at length including echocardiogram, pulmonary notes, cardiac notes   Lab Tests:  I Ordered, and personally interpreted labs.  The pertinent results include: No anemia, no leukocytosis, metabolic panel is reassuring, troponin was 28, BNP of 700, COVID and flu negative   Imaging Studies ordered:  I ordered imaging studies including chest x-ray I independently visualized and interpreted imaging which showed tiny effusions but otherwise clear lungs I agree with the radiologist interpretation   Cardiac Monitoring: / EKG:  The patient was maintained on a cardiac monitor.  I personally viewed and interpreted the cardiac monitored which showed an underlying rhythm of: Sinus rhythm with frequent ectopy of PVCs   Problem List / ED Course / Critical interventions / Medication management  After discussion with cardiology and evaluation of the patient's workup I do not see any other obvious signs of respiratory failure other than intrinsic lung disease i.e. COPD and/or pulmonary fibrosis.  The patient was given continuous  nebulizer therapy, DuoNeb, Solu-Medrol  I discussed the patient's care with Dr. Charlton with the hospital service who has been kind enough to admit this patient   Social Determinants of Health:  Prior tobacco use   Test / Admission - Considered:  Admit         Final Clinical Impression(s) / ED Diagnoses Final diagnoses:  Acute hypoxic respiratory failure (HCC)  Pulmonary fibrosis (HCC)    Rx / DC Orders ED Discharge Orders     None         Cleotilde Rogue, MD 04/23/23 1932

## 2023-04-23 NOTE — Telephone Encounter (Signed)
 Chief Complaint: SOB Symptoms: productive white cough, wheezing Frequency: x few days Pertinent Negatives: Patient denies fever, URI sx, severe SOB Disposition: [] ED /[x] Urgent Care (no appt availability in office) / [] Appointment(In office/virtual)/ []  [facility]/ [] Home Care/ [] Refused Recommended Disposition /[] [place]/ []  Follow-up with PCP Additional Notes: Pt wife, Arland calling c/o SOB, wheezing and productive white cough x a few days, that has worsened since this AM. Pt reports taking budesonide  neb x 3 this AM with minimal/no results. Pt does not have albuterol  neb on hand and rx has expired. Pt denies fever, URI sx, swelling, severe SOB. Of note, triager can appreciate pt speaking in phrases. Scheduled patient per protocol on April 23, 2023. Caregiver verbalized understanding and to call back/911 with worsening symptoms.       Copied from CRM [id number]. Topic: Clinical - Red Word Triage >> Apr 23, 2023 10:13 AM Joesph PARAS wrote: Red Word that prompted transfer to Nurse Triage: Significant worsening changes in breathing over last two days, unable to catch a breath even with a nebulizer, wants an rx for oxygen. Informed Dr. Geronimo not in-office for immediate rx. Reason for Disposition  [1] MILD difficulty breathing (e.g., minimal/no SOB at rest, SOB with walking, pulse <100) AND [2] NEW-onset or WORSE than normal  Answer Assessment - Initial Assessment Questions E2C2 Pulmonary Triage - Initial Assessment Questions Chief Complaint (e.g., cough, sob, wheezing, fever, chills, sweat or additional symptoms) *Go to specific symptom protocol after initial questions. Difficulty breathing, productive white cough   How long have symptoms been present? A few days, worse this AM  Have you tested for COVID or Flu? Note: If not, ask patient if a home test can be taken. If so, instruct patient to call back for positive results. No  MEDICINES:   Have you used  any OTC meds to help with symptoms? Yes If yes, ask What medications? Dayquil - no relief  Have you used your inhalers/maintenance medication? Yes If yes, What medications? Budesinide neb twice daily - reports 3x since 0100  Albuterol  neb  If inhaler, ask How many puffs and how often? Note: Review instructions on medication in the chart. See above   OXYGEN: Do you wear supplemental oxygen? No If yes, How many liters are you supposed to use? CPAP at night  Do you monitor your oxygen levels? Yes If yes, What is your reading (oxygen level) today? 93% HR 56  What is your usual oxygen saturation reading?  (Note: Pulmonary O2 sats should be 90% or greater) 93-95%   1. RESPIRATORY STATUS: Describe your breathing? (e.g., wheezing, shortness of breath, unable to speak, severe coughing)      Wheezing, SOB 2. ONSET: When did this breathing problem begin?      Few days 3. PATTERN Does the difficult breathing come and go, or has it been constant since it started?      constant 4. SEVERITY: How bad is your breathing? (e.g., mild, moderate, severe)    - MILD: No SOB at rest, mild SOB with walking, speaks normally in sentences, can lie down, no retractions, pulse < 100.    - MODERATE: SOB at rest, SOB with minimal exertion and prefers to sit, cannot lie down flat, speaks in phrases, mild retractions, audible wheezing, pulse 100-120.    - SEVERE: Very SOB at rest, speaks in single words, struggling to breathe, sitting hunched forward, retractions, pulse > 120      moderate 5. RECURRENT SYMPTOM: Have you had difficulty  breathing before? If Yes, ask: When was the last time? and What happened that time?      Yes, but not this bad. Recalls previous steroid use 6. CARDIAC HISTORY: Do you have any history of heart disease? (e.g., heart attack, angina, bypass surgery, angioplasty)      CHF 7. LUNG HISTORY: Do you have any history of lung disease?  (e.g.,  pulmonary embolus, asthma, emphysema)     Pulm Fibrosis 8. CAUSE: What do you think is causing the breathing problem?      unknown 9. OTHER SYMPTOMS: Do you have any other symptoms? (e.g., dizziness, runny nose, cough, chest pain, fever)     Some dizziness Denies other sx above  Protocols used: Breathing Difficulty-A-AH

## 2023-04-23 NOTE — ED Notes (Signed)
 Nasal Cannula 2L applied. O2 stat 92%.

## 2023-04-23 NOTE — H&P (Signed)
 History and Physical    Lyman MALVA Con Raddle. FMW:[id number] DOB: [birth year] DOA: 04/23/2023  PCP: Seabron Lenis, MD   Patient coming from: Home   Chief Complaint: Cough, SOB   HPI: Kaushal MALVA Con Raddle. is a 78 y.o. male with medical history significant for COPD, idiopathic pulmonary fibrosis, OSA on CPAP, CAD, mitral regurgitation, and chronic combined systolic and diastolic CHF who presents with cough and shortness of breath.  Patient reports increased cough with increased sputum production for the past week and then developed significant worsening in his dyspnea and wheezing over the past day.  He denies any chest pain or leg swelling associated with this.  He denies fever or chills.  His breathing treatments at home had been helping some until today.  ED Course: Upon arrival to the ED, patient is found to be afebrile and saturating upper 80s to low 90s on room air with tachypnea, normal HR, and stable BP.  EKG demonstrates sinus rhythm with PVCs.  Chest x-ray notable for hyperinflation, likely scar or atelectasis at the bases, and question of dilated small bowel in the upper abdomen.  Labs are notable for negative respiratory virus panel, normal WBC, normal renal function, and BNP 708.  ED physician discussed the case with Dr. Lavona of cardiology who did not feel that any inpatient cardiac workup was indicated at this time.  Patient was treated with IV Solu-Medrol  and continuous albuterol  treatment in the ED.  Review of Systems:  All other systems reviewed and apart from HPI, are negative.  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   Alcoholic polyneuropathy (HCC)    Allergy    Asthma    BPH (benign prostatic hyperplasia)    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 failure) (HCC) 04/23/2023   COPD (chronic obstructive pulmonary disease) (HCC)    Depression    Depression    Diverticulosis    Dyspnea on exertion    Elevated LDL  cholesterol level    GERD with stricture    Gout    Hiatal hernia    Hypertension    IPF (idiopathic pulmonary fibrosis) (HCC) 04/23/2023   Kidney stones    2 times - passed stones, no surgery required   Lumbar and sacral osteoarthritis    Mitral regurgitation 04/23/2023   OSA on CPAP 04/23/2023   Peripheral polyneuropathy    Sleep apnea    wears CPAP   Wears hearing aid    Bil    Past Surgical History:  Procedure Laterality Date   COLONOSCOPY     INGUINAL HERNIA REPAIR     POLYPECTOMY     RIGHT/LEFT HEART CATH AND CORONARY ANGIOGRAPHY N/A 11/19/2022   Procedure: RIGHT/LEFT HEART CATH AND CORONARY ANGIOGRAPHY;  Surgeon: Verlin Lonni BIRCH, MD;  Location: [facility];  Service: Cardiovascular;  Laterality: N/A;   TONSILLECTOMY AND ADENOIDECTOMY      Social History:   reports that he quit smoking about 54 years ago. His smoking use included cigarettes. He started smoking about 64 years ago. He has a 20 pack-year smoking history. He has never used smokeless tobacco. He reports current alcohol  use of about 7.0 - 14.0 standard drinks of alcohol  per week. He reports that he does not use drugs.  No Known Allergies  Family History  Problem Relation Age of Onset   Transient ischemic attack Mother    Heart disease Father 17   Other Son  car wreck   Colon cancer Neg Hx    Esophageal cancer Neg Hx    Rectal cancer Neg Hx    Stomach cancer Neg Hx      Prior to Admission medications   Medication Sig Start Date End Date Taking? Authorizing Provider  albuterol  (PROVENTIL  HFA;VENTOLIN  HFA) 108 (90 Base) MCG/ACT inhaler Inhale 1-2 puffs into the lungs every 6 (six) hours as needed for wheezing or shortness of breath. 12/24/16   Midge Golas, MD  albuterol  (PROVENTIL ) (2.5 MG/3ML) 0.083% nebulizer solution Take 3 mLs (2.5 mg total) by nebulization every 6 (six) hours as needed for wheezing or shortness of breath. 08/02/18   Olalere, Adewale A, MD  allopurinol   (ZYLOPRIM ) 300 MG tablet Take 300 mg by mouth daily.    [provider]  aspirin  81 MG tablet Take 81 mg by mouth daily.    [provider]  budesonide  (PULMICORT ) 0.5 MG/2ML nebulizer solution INHALE TWO MILLILITERS VIA NEBULIZATION BY MOUTH TWICE A DAY 01/02/19   Olalere, Adewale A, MD  buPROPion  (WELLBUTRIN  XL) 150 MG 24 hr tablet Take 1 tablet by mouth every morning.    [provider]  esomeprazole  (NEXIUM ) 40 MG capsule TAKE ONE CAPSULE BY MOUTH DAILY IN THE MORNING Patient taking differently: Take 40 mg by mouth daily. 07/19/14   Aneita Gwendlyn DASEN, MD  ezetimibe  (ZETIA ) 10 MG tablet Take 1 tablet (10 mg total) by mouth daily. 03/23/23 06/21/23  Ladona Heinz, MD  fluticasone  (FLONASE ) 50 MCG/ACT nasal spray Place 1 spray into both nostrils daily. 04/02/17   Sood, Vineet, MD  furosemide  (LASIX ) 40 MG tablet Take 1 tablet (40 mg total) by mouth daily. 11/19/22   Ladona Heinz, MD  Glucosamine HCl 1500 MG TABS Take 1 tablet by mouth daily.    [provider]  magnesium  oxide (MAG-OX) 400 (240 Mg) MG tablet TAKE 1 TABLET BY MOUTH DAILY 04/20/23   Dunn, Dayna N, PA-C  metoprolol  succinate (TOPROL -XL) 25 MG 24 hr tablet Take 1 tablet (25 mg total) by mouth daily. 03/23/23   Ladona Heinz, MD  Multiple Vitamins-Iron (MULTIVITAMINS WITH IRON) TABS Take 1 tablet by mouth daily.    [provider]  Pirfenidone  267 MG TABS Take 3 tablets (801 mg total) by mouth 3 (three) times daily with meals. 02/25/23   Geronimo Amel, MD  rosuvastatin  (CRESTOR ) 20 MG tablet Take 0.5 tablets (10 mg total) by mouth daily. 03/23/23   Ladona Heinz, MD  sacubitril -valsartan  (ENTRESTO ) 49-51 MG Take 1 tablet by mouth 2 (two) times daily. 01/21/23   Ladona Heinz, MD    Physical Exam: Vitals:   04/23/23 1921 04/23/23 1922 04/23/23 2000 04/23/23 2001  BP:  113/64 135/64   Pulse:  76 85   Resp:  (!) 27 (!) 31   Temp: 98.7 F (37.1 C)     TempSrc: Oral     SpO2:  92% 93% 98%     Constitutional: NAD, no pallor or diaphoresis   Eyes: PERTLA, lids and conjunctivae normal ENMT: Mucous membranes are moist. Posterior pharynx clear of any exudate or lesions.   Neck: supple, no masses  Respiratory: Diminished bilaterally with prolonged expiratory phase, course rales, and wheezes. Mild tachypnea. Speaking full sentences.    Cardiovascular: S1 & S2 heard, regular rate and rhythm. No extremity edema.  Abdomen: No tenderness, soft. Bowel sounds active.  Musculoskeletal: no clubbing / cyanosis. No joint deformity upper and lower extremities.   Skin: no significant rashes, lesions, ulcers. Warm, dry, well-perfused.  Neurologic: CN 2-12 grossly intact. Moving all extremities. Alert and oriented.  Psychiatric: Calm. Cooperative.    Labs and Imaging on Admission: I have personally reviewed following labs and imaging studies  CBC: Recent Labs  Lab 04/23/23 1620  WBC 9.6  NEUTROABS 6.9  HGB 15.0  HCT 43.2  MCV 104.1*  PLT 203   Basic Metabolic Panel: Recent Labs  Lab 04/23/23 1620  NA 138  K 3.9  CL 104  CO2 23  GLUCOSE 110*  BUN 8  CREATININE 0.73  CALCIUM  9.2   GFR: Estimated Creatinine Clearance: 91.6 mL/min (by C-G formula based on SCr of 0.73 mg/dL). Liver Function Tests: Recent Labs  Lab 04/23/23 1620  AST 19  ALT 16  ALKPHOS 85  BILITOT 1.2  PROT 7.0  ALBUMIN 3.6   No results for input(s): LIPASE, AMYLASE in the last 168 hours. No results for input(s): AMMONIA in the last 168 hours. Coagulation Profile: No results for input(s): INR, PROTIME in the last 168 hours. Cardiac Enzymes: No results for input(s): CKTOTAL, CKMB, CKMBINDEX, TROPONINI in the last 168 hours. BNP (last 3 results) Recent Labs    11/13/22 1539  PROBNP 400   HbA1C: No results for input(s): HGBA1C in the last 72 hours. CBG: No results for input(s): GLUCAP in the last 168 hours. Lipid Profile: No results for input(s): CHOL, HDL, LDLCALC,  TRIG, CHOLHDL, LDLDIRECT in the last 72 hours. Thyroid  Function Tests: No results for input(s): TSH, T4TOTAL, FREET4, T3FREE, THYROIDAB in the last 72 hours. Anemia Panel: No results for input(s): VITAMINB12, FOLATE, FERRITIN, TIBC, IRON, RETICCTPCT in the last 72 hours. Urine analysis: No results found for: COLORURINE, APPEARANCEUR, LABSPEC, PHURINE, GLUCOSEU, HGBUR, BILIRUBINUR, KETONESUR, PROTEINUR, UROBILINOGEN, NITRITE, LEUKOCYTESUR Sepsis Labs: @LABRCNTIP (procalcitonin:4,lacticidven:4) ) Recent Results (from the past 240 hours)  Resp panel by RT-PCR (RSV, Flu A&B, Covid) Anterior Nasal Swab     Status: None   Collection Time: 04/23/23  4:15 PM   Specimen: Anterior Nasal Swab  Result Value Ref Range Status   SARS Coronavirus 2 by RT PCR NEGATIVE NEGATIVE Final   Influenza A by PCR NEGATIVE NEGATIVE Final   Influenza B by PCR NEGATIVE NEGATIVE Final    Comment: (NOTE) The Xpert Xpress SARS-CoV-2/FLU/RSV plus assay is intended as an aid in the diagnosis of influenza from Nasopharyngeal swab specimens and should not be used as a sole basis for treatment. Nasal washings and aspirates are unacceptable for Xpert Xpress SARS-CoV-2/FLU/RSV testing.  Fact Sheet for Patients: bloggercourse.com  Fact Sheet for Healthcare Providers: seriousbroker.it  This test is not yet approved or cleared by the [place]  FDA and has been authorized for detection and/or diagnosis of SARS-CoV-2 by FDA under an Emergency Use Authorization (EUA). This EUA will remain in effect (meaning this test can be used) for the duration of the COVID-19 declaration under Section 564(b)(1) of the Act, 21 U.S.C. section 360bbb-3(b)(1), unless the authorization is terminated or revoked.     Resp Syncytial Virus by PCR NEGATIVE NEGATIVE Final    Comment: (NOTE) Fact Sheet for  Patients: bloggercourse.com  Fact Sheet for Healthcare Providers: seriousbroker.it  This test is not yet approved or cleared by the [place]  FDA and has been authorized for detection and/or diagnosis of SARS-CoV-2 by FDA under an Emergency Use Authorization (EUA). This EUA will remain in effect (meaning this test can be used) for the duration of the COVID-19 declaration under Section 564(b)(1) of the Act, 21 U.S.C. section 360bbb-3(b)(1), unless the authorization is terminated or revoked.  Performed at [facility], [address] [place], [place] [address]      Radiological Exams on Admission: DG Chest 2 View Result Date: 04/23/2023 CLINICAL DATA:  Shortness of breath and cough EXAM: CHEST - 2 VIEW COMPARISON:  X-ray 11/13/2022. FINDINGS: Hyperinflation. No pneumothorax or edema. There is some linear opacity at the bases likely scar or atelectasis. Tiny effusions. There are some potentially dilated loops of small bowel in the upper abdomen. Please correlate for any specific symptoms and further workup of the abdomen when appropriate such as x-ray or CT. IMPRESSION: Hyperinflation. Linear opacity the bases likely scar or atelectasis. Possible dilated small bowel in the upper abdomen at the edge of the imaging field. Recommend further workup of the abdomen when appropriate with x-ray or CT. Electronically Signed   By: Ranell Bring M.D.   On: 04/23/2023 18:18    EKG: Independently reviewed. Sinus rhythm, PVCs.   Assessment/Plan   1. COPD exacerbation; acute hypoxic respiratory failure; IPF; OSA  - Culture sputum, start antibiotic, continue systemic steroids, continue short-acting bronchodilators, continue supplemental O2 as-needed, continue pirfenidone , CPAP while sleeping     2. Chronic combined systolic & diastolic CHF; mitral regurgitation  - Appears compensated  - Continue Lasix , Toprol , and Entresto     3. CAD  - No  anginal complaints  - Continue ASA 81, Crestor , Toprol      DVT prophylaxis: Lovenox   Code Status: Full  Level of Care: Level of care: Telemetry Medical Family Communication: Wife at bedside Disposition Plan:  Patient is from: Home  Anticipated d/c is to: Home Anticipated d/c date is: 04/26/23  Patient currently: Pending improved respiratory status  Consults called: none  Admission status: Inpatient     Evalene GORMAN Sprinkles, MD [facility]  04/23/2023, 9:10 PM

## 2023-04-23 NOTE — ED Provider Notes (Signed)
 [facility]    CSN: [id number] Arrival date & time: 04/23/23  1412      History   Chief Complaint Chief Complaint  Patient presents with   Cough    Entered by patient    HPI Jose Hines. is a 78 y.o. male.   HPI  He is here with his wife They state that he has been feeling bad for some time but today SOB has gotten worse She states that he has used his nebulizer machine several times today without significant relief  They report oxygen saturation was 93-94% at home but dropped once they got to UC He is coughing up some white phlegm  He does not use Oxygen at home    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   Alcoholic polyneuropathy (HCC)    Allergy    Asthma    BPH (benign prostatic hyperplasia)    COPD (chronic obstructive pulmonary disease) (HCC)    Depression    Depression    Diverticulosis    Dyspnea on exertion    Elevated LDL cholesterol level    GERD with stricture    Gout    Hiatal hernia    Hypertension    Kidney stones    2 times - passed stones, no surgery required   Lumbar and sacral osteoarthritis    Peripheral polyneuropathy    Sleep apnea    wears CPAP   Wears hearing aid    Bil    Patient Active Problem List   Diagnosis Date Noted   Dizziness 01/16/2021   Essential hypertension 01/16/2021   GERD 01/09/2009   History of colonic polyps 01/09/2009    Past Surgical History:  Procedure Laterality Date   COLONOSCOPY     INGUINAL HERNIA REPAIR     POLYPECTOMY     RIGHT/LEFT HEART CATH AND CORONARY ANGIOGRAPHY N/A 11/19/2022   Procedure: RIGHT/LEFT HEART CATH AND CORONARY ANGIOGRAPHY;  Surgeon: Odie Benne, MD;  Location: [facility];  Service: Cardiovascular;  Laterality: N/A;   TONSILLECTOMY AND ADENOIDECTOMY         Home Medications    Prior to Admission medications   Medication Sig Start Date End Date Taking? Authorizing Provider  albuterol  (PROVENTIL   HFA;VENTOLIN  HFA) 108 (90 Base) MCG/ACT inhaler Inhale 1-2 puffs into the lungs every 6 (six) hours as needed for wheezing or shortness of breath. 12/24/16   Eldon Greenland, MD  albuterol  (PROVENTIL ) (2.5 MG/3ML) 0.083% nebulizer solution Take 3 mLs (2.5 mg total) by nebulization every 6 (six) hours as needed for wheezing or shortness of breath. 08/02/18   Olalere, Adewale A, MD  allopurinol  (ZYLOPRIM ) 300 MG tablet Take 300 mg by mouth daily.    [provider]  aspirin  81 MG tablet Take 81 mg by mouth daily.    [provider]  budesonide  (PULMICORT ) 0.5 MG/2ML nebulizer solution INHALE TWO MILLILITERS VIA NEBULIZATION BY MOUTH TWICE A DAY 01/02/19   Olalere, Adewale A, MD  buPROPion  (WELLBUTRIN  XL) 150 MG 24 hr tablet Take 1 tablet by mouth every morning.    [provider]  esomeprazole  (NEXIUM ) 40 MG capsule TAKE ONE CAPSULE BY MOUTH DAILY IN THE MORNING Patient taking differently: Take 40 mg by mouth daily. 07/19/14   Asencion Blacksmith, MD  ezetimibe  (ZETIA ) 10 MG tablet Take 1 tablet (10 mg total) by mouth daily. 03/23/23 06/21/23  Knox Perl, MD  fluticasone  (FLONASE ) 50 MCG/ACT nasal spray Place  1 spray into both nostrils daily. 04/02/17   Sood, Vineet, MD  furosemide  (LASIX ) 40 MG tablet Take 1 tablet (40 mg total) by mouth daily. 11/19/22   Knox Perl, MD  Glucosamine HCl 1500 MG TABS Take 1 tablet by mouth daily.    [provider]  magnesium  oxide (MAG-OX) 400 (240 Mg) MG tablet TAKE 1 TABLET BY MOUTH DAILY 04/20/23   Dunn, Dayna N, PA-C  metoprolol  succinate (TOPROL -XL) 25 MG 24 hr tablet Take 1 tablet (25 mg total) by mouth daily. 03/23/23   Knox Perl, MD  Multiple Vitamins-Iron (MULTIVITAMINS WITH IRON) TABS Take 1 tablet by mouth daily.    [provider]  Pirfenidone  267 MG TABS Take 3 tablets (801 mg total) by mouth 3 (three) times daily with meals. 02/25/23   Maire Scot, MD  rosuvastatin  (CRESTOR ) 20 MG tablet Take 0.5 tablets (10 mg  total) by mouth daily. 03/23/23   Knox Perl, MD  sacubitril -valsartan  (ENTRESTO ) 49-51 MG Take 1 tablet by mouth 2 (two) times daily. 01/21/23   Knox Perl, MD    Family History Family History  Problem Relation Age of Onset   Transient ischemic attack Mother    Heart disease Father 24   Other Son        car wreck   Colon cancer Neg Hx    Esophageal cancer Neg Hx    Rectal cancer Neg Hx    Stomach cancer Neg Hx     Social History Social History   Tobacco Use   Smoking status: Former    Current packs/day: 0.00    Average packs/day: 2.0 packs/day for 10.0 years (20.0 ttl pk-yrs)    Types: Cigarettes    Start date: 12/29/1958    Quit date: 12/28/1968    Years since quitting: 54.3   Smokeless tobacco: Never  Vaping Use   Vaping status: Never Used  Substance Use Topics   Alcohol use: Yes    Alcohol/week: 7.0 - 14.0 standard drinks of alcohol    Types: 7 - 14 Standard drinks or equivalent per week    Comment: bourbon   Drug use: No     Allergies   Patient has no known allergies.   Review of Systems Review of Systems  Constitutional:  Negative for chills and fever.  Respiratory:  Positive for cough, shortness of breath and wheezing.   Cardiovascular:  Negative for chest pain and palpitations.     Physical Exam Triage Vital Signs ED Triage Vitals  Encounter Vitals Group     BP 04/23/23 1441 107/63     Systolic BP Percentile --      Diastolic BP Percentile --      Pulse Rate 04/23/23 1441 79     Resp 04/23/23 1441 (!) 24     Temp 04/23/23 1441 98.1 F (36.7 C)     Temp Source 04/23/23 1441 Oral     SpO2 04/23/23 1441 (!) 89 %     Weight 04/23/23 1442 220 lb (99.8 kg)     Height 04/23/23 1442 5\' 11"  (1.803 m)     Head Circumference --      Peak Flow --      Pain Score 04/23/23 1441 0     Pain Loc --      Pain Education --      Exclude from Growth Chart --    No data found.  Updated Vital Signs BP 107/63 (BP Location: Right Arm)   Pulse 79   Temp  98.1  F (36.7 C) (Oral)   Resp (!) 22   Ht 5\' 11"  (1.803 m)   Wt 220 lb (99.8 kg)   SpO2 92%   BMI 30.68 kg/m   Visual Acuity Right Eye Distance:   Left Eye Distance:   Bilateral Distance:    Right Eye Near:   Left Eye Near:    Bilateral Near:     Physical Exam Vitals reviewed.  Constitutional:      General: He is awake. He is in acute distress.     Appearance: He is well-developed and well-groomed. He is ill-appearing.  HENT:     Head: Normocephalic and atraumatic.  Cardiovascular:     Rate and Rhythm: Normal rate and regular rhythm.     Heart sounds: Heart sounds are distant.  Pulmonary:     Effort: Tachypnea and respiratory distress present.     Breath sounds: Decreased air movement present. Examination of the right-lower field reveals decreased breath sounds. Examination of the left-lower field reveals decreased breath sounds. Decreased breath sounds and wheezing present.     Comments: Patient is not able to talk in complete sentences due to shortness of breath. Skin:    General: Skin is warm and dry.  Neurological:     General: No focal deficit present.     Mental Status: He is alert and oriented to person, place, and time.     GCS: GCS eye subscore is 4. GCS verbal subscore is 5. GCS motor subscore is 6.  Psychiatric:        Mood and Affect: Mood normal.        Behavior: Behavior normal. Behavior is cooperative.      UC Treatments / Results  Labs (all labs ordered are listed, but only abnormal results are displayed) Labs Reviewed - No data to display  EKG   Radiology No results found.  Procedures ED EKG  Date/Time: 04/23/2023 3:11 PM  Performed by: Jerona Mooring, PA-C Authorized by: Jerona Mooring, PA-C   Previous ECG:    Previous ECG:  Compared to current   Similarity:  Changes noted   Comparison ECG info:  12/07/2022 Interpretation:    Interpretation: abnormal   Rate:    ECG rate:  80   ECG rate assessment: normal   Rhythm:    Rhythm: sinus  rhythm   ST segments:    ST segments:  Depression   Depression:  I, II, V5 and V6  (including critical care time)  Medications Ordered in UC Medications - No data to display  Initial Impression / Assessment and Plan / UC Course  I have reviewed the triage vital signs and the nursing notes.  Pertinent labs & imaging results that were available during my care of the patient were reviewed by me and considered in my medical decision making (see chart for details).      Final Clinical Impressions(s) / UC Diagnoses   Final diagnoses:  Low oxygen saturation  SOB (shortness of breath)  Nonspecific abnormal electrocardiogram (ECG) (EKG)   Patient presents today with his wife.  They report concerns for shortness of breath, cough and generalized illness that has gotten worse since yesterday.  Patient and his wife report that he is having significant shortness of breath that started last night into the early morning and has not improved despite several uses of his nebulizer treatments.  Physical exam today reveals pleasant 78 year old male who appears to be in acute distress and does appear ill in  clinic.  He is having significant tachypnea and despite being on 2 L of oxygen via cannula his oxygen saturation is only increasing to about 93%.  On arrival oxygen saturation was 88%.  EKG was also performed which showed ST depressions in leads I, II, V5 and V6.  These changes are not apparent in previous EKG from December 07, 2022 so I am concerned for potential cardiac process.  I shared my concerns with the patient and his wife and recommend prompt emergency room evaluation.  Discussed sending patient via private vehicle versus EMS and patient and wife are amenable to going by private vehicle.  They declined EMS transportation x 2 during visit and again stated that they would go by private vehicle.  Recommend close monitoring on the way to the emergency room and if they are unable to make it recommend  pulling over the side of the road and calling 911 for further assistance.  Patient and wife voiced agreement understanding with recommendations and stated they will go via private vehicle to the closest emergency room.    Discharge Instructions      You were seen today in urgent care for concerns of shortness of breath that is not improving with your home meds.  At this time we had to place you on supplemental oxygen to improve your oxygen saturation but it was still low.  You are also breathing rather heavily despite being on oxygen.  We also performed an EKG which showed some abnormalities called ST depressions which I am concerned about as these were not present on a previous EKG from December.  At this time I recommend that you go to the emergency room for further evaluation and management.  You have declined transportation by EMS and expressed a desire to go by private vehicle.  If at any point you feel like you cannot make it to the emergency room please pull over and call 911 for further assistance.     ED Prescriptions   None    PDMP not reviewed this encounter.   Jose Hines, Pearla Bottom, PA-C 04/23/23 1525

## 2023-04-23 NOTE — ED Notes (Signed)
 Patient is being discharged from the Urgent Care and sent to the Emergency Department via private vehicle . Per Lindon PA, patient is in need of higher level of care due to cough, SOB, wheezing, and oxygen saturation. Patient is aware and verbalizes understanding of plan of care.  Vitals:   04/23/23 1441 04/23/23 1456  BP: 107/63   Pulse: 79   Resp: (!) 24 (!) 22  Temp: 98.1 F (36.7 C)   SpO2: (!) 89% 92%     EMS transport offered. Highlighted the importance of being on oxygen at this time. Pt denied.

## 2023-04-23 NOTE — ED Triage Notes (Signed)
 Pt is here for further evaluation of sob.  Pt was seen at [facility].  Pt spo2 94% on RA in triage.  Pt denies any CP or edema.  Pt has had a cough for over a week and he has had some wheezing.

## 2023-04-23 NOTE — ED Notes (Signed)
 EKG given to [facility].

## 2023-04-23 NOTE — Discharge Instructions (Signed)
 You were seen today in urgent care for concerns of shortness of breath that is not improving with your home meds.  At this time we had to place you on supplemental oxygen to improve your oxygen saturation but it was still low.  You are also breathing rather heavily despite being on oxygen.  We also performed an EKG which showed some abnormalities called ST depressions which I am concerned about as these were not present on a previous EKG from December.  At this time I recommend that you go to the emergency room for further evaluation and management.  You have declined transportation by EMS and expressed a desire to go by private vehicle.  If at any point you feel like you cannot make it to the emergency room please pull over and call 911 for further assistance.

## 2023-04-23 NOTE — ED Triage Notes (Signed)
 Pt presents with complaints of cough x 1 week. Hx of pulmonary fibrosis and CHF. Pt states his SOB and wheezing has worsened today. Pt currently denies pain. Nebulizer machine used at home, last time was 1000 this AM. O2 stat when checked at home was 93-94% approximately 2 hours ago.

## 2023-04-23 NOTE — ED Provider Triage Note (Signed)
 Emergency Medicine Provider Triage Evaluation Note  Jose Hines. , a 78 y.o. male  was evaluated in triage.  Pt complains of SOB.  Review of Systems  Positive: SOB/DOE, loss of appetite, cough Negative: Chest pain, fever  Physical Exam  BP 124/80 (BP Location: Right Arm)   Pulse 85   Temp 98.6 F (37 C)   Resp 18   SpO2 94%  Gen:   Awake, no distress   Resp:  Normal effort  MSK:   Moves extremities without difficulty  Other:  No edema  Medical Decision Making  Medically screening exam initiated at 4:11 PM.  Appropriate orders placed.  Jose Hines. was informed that the remainder of the evaluation will be completed by another provider, this initial triage assessment does not replace that evaluation, and the importance of remaining in the ED until their evaluation is complete.  SOB x 2 days. Cough for one week.  No pain. Seen at Urgent Care just prior to arrival who reported hypoxia.    Mandy Second, PA-C 04/23/23 [phone]

## 2023-04-24 DIAGNOSIS — J441 Chronic obstructive pulmonary disease with (acute) exacerbation: Secondary | ICD-10-CM | POA: Diagnosis not present

## 2023-04-24 LAB — CBC
HCT: 40.6 % (ref 39.0–52.0)
Hemoglobin: 14.2 g/dL (ref 13.0–17.0)
MCH: 35.4 pg — ABNORMAL HIGH (ref 26.0–34.0)
MCHC: 35 g/dL (ref 30.0–36.0)
MCV: 101.2 fL — ABNORMAL HIGH (ref 80.0–100.0)
Platelets: 200 10*3/uL (ref 150–400)
RBC: 4.01 MIL/uL — ABNORMAL LOW (ref 4.22–5.81)
RDW: 12.9 % (ref 11.5–15.5)
WBC: 5.6 10*3/uL (ref 4.0–10.5)
nRBC: 0 % (ref 0.0–0.2)

## 2023-04-24 LAB — BASIC METABOLIC PANEL WITH GFR
Anion gap: 9 (ref 5–15)
BUN: 11 mg/dL (ref 8–23)
CO2: 25 mmol/L (ref 22–32)
Calcium: 9.3 mg/dL (ref 8.9–10.3)
Chloride: 103 mmol/L (ref 98–111)
Creatinine, Ser: 0.72 mg/dL (ref 0.61–1.24)
GFR, Estimated: >60 mL/min (ref >60)
Glucose, Bld: 166 mg/dL — ABNORMAL HIGH (ref 70–99)
Potassium: 4 mmol/L (ref 3.5–5.1)
Sodium: 137 mmol/L (ref 135–145)

## 2023-04-24 LAB — MAGNESIUM: Magnesium: 1.8 mg/dL (ref 1.7–2.4)

## 2023-04-24 MED ORDER — METHYLPREDNISOLONE SODIUM SUCC 40 MG IJ SOLR
40.0000 mg | Freq: Two times a day (BID) | INTRAMUSCULAR | Status: DC
  Administered 2023-04-24 – 2023-04-25 (×2): 40 mg via INTRAVENOUS
  Filled 2023-04-24 (×3): qty 1

## 2023-04-24 MED ORDER — BUDESONIDE 0.25 MG/2ML IN SUSP
0.2500 mg | Freq: Two times a day (BID) | RESPIRATORY_TRACT | Status: DC
  Administered 2023-04-24 – 2023-04-25 (×2): 0.25 mg via RESPIRATORY_TRACT
  Filled 2023-04-24 (×2): qty 2

## 2023-04-24 NOTE — Plan of Care (Signed)
  Problem: Education: Goal: Knowledge of General Education information will improve Description: Including pain rating scale, medication(s)/side effects and non-pharmacologic comfort measures Outcome: Progressing   Problem: Health Behavior/Discharge Planning: Goal: Ability to manage health-related needs will improve Outcome: Progressing   Problem: Clinical Measurements: Goal: Ability to maintain clinical measurements within normal limits will improve Outcome: Progressing Goal: Will remain free from infection Outcome: Progressing Goal: Diagnostic test results will improve Outcome: Progressing Goal: Respiratory complications will improve Outcome: Progressing Goal: Cardiovascular complication will be avoided Outcome: Progressing   Problem: Activity: Goal: Risk for activity intolerance will decrease Outcome: Progressing   Problem: Nutrition: Goal: Adequate nutrition will be maintained Outcome: Progressing   Problem: Coping: Goal: Level of anxiety will decrease Outcome: Progressing   Problem: Elimination: Goal: Will not experience complications related to bowel motility Outcome: Progressing Goal: Will not experience complications related to urinary retention Outcome: Progressing   Problem: Pain Managment: Goal: General experience of comfort will improve and/or be controlled Outcome: Progressing   Problem: Safety: Goal: Ability to remain free from injury will improve Outcome: Progressing   Problem: Skin Integrity: Goal: Risk for impaired skin integrity will decrease Outcome: Progressing   Problem: Education: Goal: Knowledge of disease or condition will improve Outcome: Progressing Goal: Knowledge of the prescribed therapeutic regimen will improve Outcome: Progressing Goal: Individualized Educational Video(s) Outcome: Progressing   Problem: Activity: Goal: Ability to tolerate increased activity will improve Outcome: Progressing Goal: Will verbalize the  importance of balancing activity with adequate rest periods Outcome: Progressing   Problem: Respiratory: Goal: Ability to maintain a clear airway will improve Outcome: Progressing Goal: Levels of oxygenation will improve Outcome: Progressing Goal: Ability to maintain adequate ventilation will improve Outcome: Progressing   Problem: Education: Goal: Ability to demonstrate management of disease process will improve Outcome: Progressing Goal: Ability to verbalize understanding of medication therapies will improve Outcome: Progressing Goal: Individualized Educational Video(s) Outcome: Progressing   Problem: Activity: Goal: Capacity to carry out activities will improve Outcome: Progressing   Problem: Cardiac: Goal: Ability to achieve and maintain adequate cardiopulmonary perfusion will improve Outcome: Progressing

## 2023-04-24 NOTE — Care Management CC44 (Signed)
 Condition Code 44 Documentation Completed  Patient Details  Name: Jose Hines. MRN: [id number] Date of Birth: [birth year]   Condition Code 44 given:  Yes Patient signature on Condition Code 44 notice:  Yes Documentation of 2 MD's agreement:  Yes Code 44 added to claim:  Yes    Omie Bickers, RN 04/24/2023, 4:48 PM

## 2023-04-24 NOTE — Care Management Obs Status (Signed)
 MEDICARE OBSERVATION STATUS NOTIFICATION   Patient Details  Name: Jose Hines. MRN: [id number] Date of Birth: 1945/04/08   Medicare Observation Status Notification Given:  Yes    Omie Bickers, RN 04/24/2023, 4:48 PM

## 2023-04-24 NOTE — Progress Notes (Signed)
 PROGRESS NOTE    Jose Hines.  XLK:[id number] DOB: Oct 07, 1945 DOA: 04/23/2023 PCP: Rae Bugler, MD  Outpatient Specialists:     Brief Narrative:  Patient is a 78 year old male with past medical history significant for COPD, idiopathic pulmonary fibrosis, OSA on CPAP, CAD, mitral regurgitation, and chronic combined systolic and diastolic CHF.  Patient was admitted with worsening shortness of breath and cough.  Patient is currently on IV Solu-Medrol , antibiotics and nebulizer treatment.  Patient is slowly improving.   Assessment & Plan:   Principal Problem:   COPD with acute exacerbation (HCC) Active Problems:   Essential hypertension   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 failure) (HCC)   Mitral regurgitation   OSA on CPAP   IPF (idiopathic pulmonary fibrosis) (HCC)   Acute respiratory failure with hypoxia (HCC)   1. COPD exacerbation; acute hypoxic respiratory failure; IPF; OSA  - IV Solu-Medrol  - Nebs treatment. - IV antibiotics. - Supportive care.        2. Chronic combined systolic & diastolic CHF; mitral regurgitation  - Appears compensated  - Continue Lasix , Toprol , and Entresto      3. CAD  - No anginal complaints  - Continue ASA 81, Crestor , Toprol     DVT prophylaxis: Subcutaneous Lovenox . Code Status: Full code. Family Communication: Wife. Disposition Plan: Place in observation.   Consultants:  None.  Procedures:  None.  Antimicrobials:  IV Rocephin .   Subjective: Shortness of breath is improving.  Objective: Vitals:   04/24/23 0214 04/24/23 0555 04/24/23 0837 04/24/23 0900  BP:  111/84  (!) 112/55  Pulse: 91 95  97  Resp: (!) 24 17  17   Temp:  97.8 F (36.6 C)  98.2 F (36.8 C)  TempSrc:    Oral  SpO2: 95% 94% 95% 95%  Weight:      Height:        Intake/Output Summary (Last 24 hours) at 04/24/2023 0921 Last data filed at 04/23/2023 2316 Gross per 24 hour  Intake 100 ml  Output --  Net 100 ml    Filed Weights   04/24/23 0145  Weight: 97.2 kg    Examination:  General exam: Appears calm and comfortable  Respiratory system: Decreased air entry with expiratory wheeze. Cardiovascular system: S1 & S2 heard Gastrointestinal system: Abdomen is obese, soft and nontender.  Central nervous system: Alert and oriented.   Data Reviewed: I have personally reviewed following labs and imaging studies  CBC: Recent Labs  Lab 04/23/23 1620 04/24/23 0702  WBC 9.6 5.6  NEUTROABS 6.9  --   HGB 15.0 14.2  HCT 43.2 40.6  MCV 104.1* 101.2*  PLT 203 200   Basic Metabolic Panel: Recent Labs  Lab 04/23/23 1620 04/24/23 0702  NA 138 137  K 3.9 4.0  CL 104 103  CO2 23 25  GLUCOSE 110* 166*  BUN 8 11  CREATININE 0.73 0.72  CALCIUM  9.2 9.3  MG  --  1.8   GFR: Estimated Creatinine Clearance: 90.5 mL/min (by C-G formula based on SCr of 0.72 mg/dL). Liver Function Tests: Recent Labs  Lab 04/23/23 1620  AST 19  ALT 16  ALKPHOS 85  BILITOT 1.2  PROT 7.0  ALBUMIN 3.6   No results for input(s): "LIPASE", "AMYLASE" in the last 168 hours. No results for input(s): "AMMONIA" in the last 168 hours. Coagulation Profile: No results for input(s): "INR", "PROTIME" in the last 168 hours. Cardiac Enzymes: No results for input(s): "CKTOTAL", "CKMB", "CKMBINDEX", "TROPONINI" in the last  168 hours. BNP (last 3 results) Recent Labs    11/13/22 1539  PROBNP 400   HbA1C: No results for input(s): "HGBA1C" in the last 72 hours. CBG: No results for input(s): "GLUCAP" in the last 168 hours. Lipid Profile: No results for input(s): "CHOL", "HDL", "LDLCALC", "TRIG", "CHOLHDL", "LDLDIRECT" in the last 72 hours. Thyroid  Function Tests: No results for input(s): "TSH", "T4TOTAL", "FREET4", "T3FREE", "THYROIDAB" in the last 72 hours. Anemia Panel: No results for input(s): "VITAMINB12", "FOLATE", "FERRITIN", "TIBC", "IRON", "RETICCTPCT" in the last 72 hours. Urine analysis: No results found for:  "COLORURINE", "APPEARANCEUR", "LABSPEC", "PHURINE", "GLUCOSEU", "HGBUR", "BILIRUBINUR", "KETONESUR", "PROTEINUR", "UROBILINOGEN", "NITRITE", "LEUKOCYTESUR" Sepsis Labs: @LABRCNTIP (procalcitonin:4,lacticidven:4)  ) Recent Results (from the past 240 hours)  Resp panel by RT-PCR (RSV, Flu A&B, Covid) Anterior Nasal Swab     Status: None   Collection Time: 04/23/23  4:15 PM   Specimen: Anterior Nasal Swab  Result Value Ref Range Status   SARS Coronavirus 2 by RT PCR NEGATIVE NEGATIVE Final   Influenza A by PCR NEGATIVE NEGATIVE Final   Influenza B by PCR NEGATIVE NEGATIVE Final    Comment: (NOTE) The Xpert Xpress SARS-CoV-2/FLU/RSV plus assay is intended as an aid in the diagnosis of influenza from Nasopharyngeal swab specimens and should not be used as a sole basis for treatment. Nasal washings and aspirates are unacceptable for Xpert Xpress SARS-CoV-2/FLU/RSV testing.  Fact Sheet for Patients: BloggerCourse.com  Fact Sheet for Healthcare Providers: SeriousBroker.it  This test is not yet approved or cleared by the [place]  FDA and has been authorized for detection and/or diagnosis of SARS-CoV-2 by FDA under an Emergency Use Authorization (EUA). This EUA will remain in effect (meaning this test can be used) for the duration of the COVID-19 declaration under Section 564(b)(1) of the Act, 21 U.S.C. section 360bbb-3(b)(1), unless the authorization is terminated or revoked.     Resp Syncytial Virus by PCR NEGATIVE NEGATIVE Final    Comment: (NOTE) Fact Sheet for Patients: BloggerCourse.com  Fact Sheet for Healthcare Providers: SeriousBroker.it  This test is not yet approved or cleared by the [place]  FDA and has been authorized for detection and/or diagnosis of SARS-CoV-2 by FDA under an Emergency Use Authorization (EUA). This EUA will remain in effect (meaning this test  can be used) for the duration of the COVID-19 declaration under Section 564(b)(1) of the Act, 21 U.S.C. section 360bbb-3(b)(1), unless the authorization is terminated or revoked.  Performed at [facility], [address] [place], [place] [address]          Radiology Studies: DG Abd Portable 1V Result Date: 04/23/2023 CLINICAL DATA:  Abnormal chest x-ray EXAM: PORTABLE ABDOMEN - 1 VIEW COMPARISON:  04/23/2023 FINDINGS: Nonobstructed gas pattern with mild stool in the colon. No radiopaque calculi. IMPRESSION: Nonobstructed gas pattern with mild stool in the colon. Electronically Signed   By: Esmeralda Hedge M.D.   On: 04/23/2023 23:22   DG Chest 2 View Result Date: 04/23/2023 CLINICAL DATA:  Shortness of breath and cough EXAM: CHEST - 2 VIEW COMPARISON:  X-ray 11/13/2022. FINDINGS: Hyperinflation. No pneumothorax or edema. There is some linear opacity at the bases likely scar or atelectasis. Tiny effusions. There are some potentially dilated loops of small bowel in the upper abdomen. Please correlate for any specific symptoms and further workup of the abdomen when appropriate such as x-ray or CT. IMPRESSION: Hyperinflation. Linear opacity the bases likely scar or atelectasis. Possible dilated small bowel in the upper abdomen at the edge of the  imaging field. Recommend further workup of the abdomen when appropriate with x-ray or CT. Electronically Signed   By: Adrianna Horde M.D.   On: 04/23/2023 18:18        Scheduled Meds:  allopurinol   300 mg Oral Daily   aspirin   81 mg Oral Daily   buPROPion   150 mg Oral q morning   enoxaparin  (LOVENOX ) injection  40 mg Subcutaneous Q24H   furosemide   40 mg Oral Daily   ipratropium-albuterol   3 mL Nebulization Q6H   metoprolol  succinate  25 mg Oral Daily   pantoprazole   40 mg Oral Daily   Pirfenidone   801 mg Oral TID WC   rosuvastatin   10 mg Oral Daily   sacubitril -valsartan   1 tablet Oral BID   sodium chloride  flush  3 mL Intravenous Q12H    Continuous Infusions:  cefTRIAXone  (ROCEPHIN )  IV Stopped (04/23/23 2316)     LOS: 1 day    Time spent: 35 minutes.    Fonnie Iba, MD  [facility] Pager #: [phone] 7PM-7AM contact night coverage as above

## 2023-04-24 NOTE — Progress Notes (Signed)
 Made respiratory aware pt is asking for CPAP

## 2023-04-25 DIAGNOSIS — J441 Chronic obstructive pulmonary disease with (acute) exacerbation: Secondary | ICD-10-CM | POA: Diagnosis not present

## 2023-04-25 LAB — CBC
HCT: 42.5 % (ref 39.0–52.0)
Hemoglobin: 14.7 g/dL (ref 13.0–17.0)
MCH: 35.7 pg — ABNORMAL HIGH (ref 26.0–34.0)
MCHC: 34.6 g/dL (ref 30.0–36.0)
MCV: 103.2 fL — ABNORMAL HIGH (ref 80.0–100.0)
Platelets: 223 10*3/uL (ref 150–400)
RBC: 4.12 MIL/uL — ABNORMAL LOW (ref 4.22–5.81)
RDW: 13.3 % (ref 11.5–15.5)
WBC: 15.7 10*3/uL — ABNORMAL HIGH (ref 4.0–10.5)
nRBC: 0 % (ref 0.0–0.2)

## 2023-04-25 LAB — BASIC METABOLIC PANEL WITH GFR
Anion gap: 11 (ref 5–15)
BUN: 15 mg/dL (ref 8–23)
CO2: 25 mmol/L (ref 22–32)
Calcium: 9.2 mg/dL (ref 8.9–10.3)
Chloride: 101 mmol/L (ref 98–111)
Creatinine, Ser: 0.79 mg/dL (ref 0.61–1.24)
GFR, Estimated: >60 mL/min (ref >60)
Glucose, Bld: 171 mg/dL — ABNORMAL HIGH (ref 70–99)
Potassium: 4 mmol/L (ref 3.5–5.1)
Sodium: 137 mmol/L (ref 135–145)

## 2023-04-25 MED ORDER — METHYLPREDNISOLONE SODIUM SUCC 40 MG IJ SOLR: 40.0000 mg | Freq: Two times a day (BID) | INTRAMUSCULAR | 0 refills | Status: DC

## 2023-04-25 MED ORDER — PREDNISONE 10 MG PO TABS: ORAL_TABLET | ORAL | 0 refills | Status: DC

## 2023-04-25 MED ORDER — POLYETHYLENE GLYCOL 3350 17 G PO PACK: 17.0000 g | PACK | Freq: Every day | ORAL | 0 refills | Status: DC | PRN

## 2023-04-25 NOTE — Plan of Care (Signed)
  Problem: Education: Goal: Knowledge of General Education information will improve Description: Including pain rating scale, medication(s)/side effects and non-pharmacologic comfort measures Outcome: Progressing   Problem: Health Behavior/Discharge Planning: Goal: Ability to manage health-related needs will improve Outcome: Progressing   Problem: Clinical Measurements: Goal: Ability to maintain clinical measurements within normal limits will improve Outcome: Progressing Goal: Will remain free from infection Outcome: Progressing Goal: Diagnostic test results will improve Outcome: Progressing Goal: Respiratory complications will improve Outcome: Progressing Goal: Cardiovascular complication will be avoided Outcome: Progressing   Problem: Activity: Goal: Risk for activity intolerance will decrease Outcome: Progressing   Problem: Nutrition: Goal: Adequate nutrition will be maintained Outcome: Progressing   Problem: Coping: Goal: Level of anxiety will decrease Outcome: Progressing   Problem: Elimination: Goal: Will not experience complications related to bowel motility Outcome: Progressing Goal: Will not experience complications related to urinary retention Outcome: Progressing   Problem: Pain Managment: Goal: General experience of comfort will improve and/or be controlled Outcome: Progressing   Problem: Safety: Goal: Ability to remain free from injury will improve Outcome: Progressing   Problem: Skin Integrity: Goal: Risk for impaired skin integrity will decrease Outcome: Progressing   Problem: Education: Goal: Knowledge of disease or condition will improve Outcome: Progressing Goal: Knowledge of the prescribed therapeutic regimen will improve Outcome: Progressing Goal: Individualized Educational Video(s) Outcome: Progressing   Problem: Activity: Goal: Ability to tolerate increased activity will improve Outcome: Progressing Goal: Will verbalize the  importance of balancing activity with adequate rest periods Outcome: Progressing   Problem: Respiratory: Goal: Ability to maintain a clear airway will improve Outcome: Progressing Goal: Levels of oxygenation will improve Outcome: Progressing Goal: Ability to maintain adequate ventilation will improve Outcome: Progressing   Problem: Education: Goal: Ability to demonstrate management of disease process will improve Outcome: Progressing Goal: Ability to verbalize understanding of medication therapies will improve Outcome: Progressing Goal: Individualized Educational Video(s) Outcome: Progressing   Problem: Activity: Goal: Capacity to carry out activities will improve Outcome: Progressing   Problem: Cardiac: Goal: Ability to achieve and maintain adequate cardiopulmonary perfusion will improve Outcome: Progressing

## 2023-04-27 ENCOUNTER — Other Ambulatory Visit: Payer: Self-pay

## 2023-04-27 ENCOUNTER — Other Ambulatory Visit (HOSPITAL_COMMUNITY): Payer: Self-pay

## 2023-04-27 NOTE — H&P (View-Only) (Signed)
 Patient ID: Jose O Garin Jr. MRN: [id number] DOB/AGE: [birth year] 79 y.o.  Primary Care Physician:Swayne, Myrtie Atkinson, MD Primary Cardiologist: Berry Bristol  CC:  Mitral valvular disease management     FOCUSED PROBLEM LIST:   Ischemic cardiomyopathy EF 35 to 40% CAD CTO dominant RCA, high-grade proximal LAD disease cath November 2024 Mitral regurgitation Moderate to severe ([place]ent[place] IIIB) TTE April 2025 Mean wedge pressure 19; V waves 24 RHC November 2024 Hyperlipidemia Aortic atherosclerosis Chest CT 2024 Idiopathic pulmonary fibrosis COPD  April 2025:  Patient consents to use of AI scribe. Patient is a 78 year old male with the above listed medical problems referred by Dr. Berry Bristol for recommendations regarding his mitral regurgitation.  Patient has a complex medical history as summarized above.  He first saw Dr. Berry Bristol in October 2024 for dyspnea.  He ultimately was referred to pulmonology and diagnosed with idiopathic pulmonary fibrosis along with COPD.  Echocardiogram demonstrated ejection fraction of 30 to 35%.  He underwent coronary angiography which demonstrated an occluded dominant right coronary artery with high-grade LAD disease.  Echocardiogram more recently demonstrated moderate to severe mitral regurgitation.  He was started on goal-directed medical therapy consisting of mid dose Entresto  and Toprol  which was recently increased to 25 mg.  He is referred for further recommendations.  He experiences significant shortness of breath that has progressively worsened over the past year. Previously able to perform activities like mowing the grass, he now experiences shortness of breath with minimal exertion, such as walking to the bathroom. No shortness of breath while sitting, except for a recent episode on Friday when he was very short of breath while sitting on the sofa. He was treated for a COPD exacerbation during a recent hospital admission, where he received breathing treatments for  wheezing.  He experiences wheezing, sometimes more noticeable to others than himself. Recently hospitalized for a COPD exacerbation, he received breathing treatments. He is on pirfenidone  for lung fibrosis but missed a dose during his hospital stay due to unavailability.  He experiences difficulty breathing when lying flat and uses two pillows to sleep, occasionally increasing to three when experiencing breathing problems. No chest pain or recent blacking out spells.  He reports low blood pressure and occasional dizziness, though not on the day of the visit. He does not regularly check his blood pressure at home. He bruises easily, as noted by his caregiver. He has been taking his medications regularly, except for a missed dose of pirfenidone  during his hospital stay. He is on medications for heart failure and cholesterol, including Entresto , Toprol , and Lasix , but expresses dislike for Lasix  due to frequent urination.  He has a poor appetite and finds eating to be a chore, often feeling full quickly. His caregiver notes that he does not eat or drink as much as he should. No recent swelling in his legs.      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   Alcoholic polyneuropathy (HCC)    Allergy    Asthma    BPH (benign prostatic hyperplasia)    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 failure) (HCC) 04/23/2023   COPD (chronic obstructive pulmonary disease) (HCC)    Depression    Depression    Diverticulosis    Dyspnea on exertion    Elevated LDL cholesterol level    GERD with stricture    Gout    Hiatal hernia    Hypertension    IPF (idiopathic pulmonary fibrosis) (HCC) 04/23/2023  Kidney stones    2 times - passed stones, no surgery required   Lumbar and sacral osteoarthritis    Mitral regurgitation 04/23/2023   OSA on CPAP 04/23/2023   Peripheral polyneuropathy    Sleep apnea    wears CPAP   Wears hearing aid    Bil    Past  Surgical History:  Procedure Laterality Date   COLONOSCOPY     INGUINAL HERNIA REPAIR     POLYPECTOMY     RIGHT/LEFT HEART CATH AND CORONARY ANGIOGRAPHY N/A 11/19/2022   Procedure: RIGHT/LEFT HEART CATH AND CORONARY ANGIOGRAPHY;  Surgeon: Odie Benne, MD;  Location: [facility];  Service: Cardiovascular;  Laterality: N/A;   TONSILLECTOMY AND ADENOIDECTOMY      Family History  Problem Relation Age of Onset   Transient ischemic attack Mother    Heart disease Father 28   Other Son        car wreck   Colon cancer Neg Hx    Esophageal cancer Neg Hx    Rectal cancer Neg Hx    Stomach cancer Neg Hx     Social History   Socioeconomic History   Marital status: Married    Spouse name: Not on file   Number of children: 2   Years of education: Not on file   Highest education level: Not on file  Occupational History   Occupation: Engineer, site  Tobacco Use   Smoking status: Former    Current packs/day: 0.00    Average packs/day: 2.0 packs/day for 10.0 years (20.0 ttl pk-yrs)    Types: Cigarettes    Start date: 12/29/1958    Quit date: 12/28/1968    Years since quitting: 54.3   Smokeless tobacco: Never  Vaping Use   Vaping status: Never Used  Substance and Sexual Activity   Alcohol use: Yes    Alcohol/week: 7.0 - 14.0 standard drinks of alcohol    Types: 7 - 14 Standard drinks or equivalent per week    Comment: bourbon   Drug use: No   Sexual activity: Not on file  Other Topics Concern   Not on file  Social History Narrative   Not on file   Social Drivers of Health   Financial Resource Strain: Not on file  Food Insecurity: No Food Insecurity (04/23/2023)   Hunger Vital Sign    Worried About Running Out of Food in the Last Year: Never true    Ran Out of Food in the Last Year: Never true  Transportation Needs: No Transportation Needs (04/23/2023)   PRAPARE - Administrator, Civil Service (Medical): No    Lack of Transportation (Non-Medical):  No  Physical Activity: Not on file  Stress: Not on file  Social Connections: Moderately Isolated (04/23/2023)   Social Connection and Isolation Panel [NHANES]    Frequency of Communication with Friends and Family: More than three times a week    Frequency of Social Gatherings with Friends and Family: More than three times a week    Attends Religious Services: Never    Database administrator or Organizations: No    Attends Banker Meetings: Never    Marital Status: Married  Catering manager Violence: Not At Risk (04/23/2023)   Humiliation, Afraid, Rape, and Kick questionnaire    Fear of Current or Ex-Partner: No    Emotionally Abused: No    Physically Abused: No    Sexually Abused: No     Prior to Admission  medications   Medication Sig Start Date End Date Taking? Authorizing Provider  albuterol  (PROVENTIL  HFA;VENTOLIN  HFA) 108 (90 Base) MCG/ACT inhaler Inhale 1-2 puffs into the lungs every 6 (six) hours as needed for wheezing or shortness of breath. 12/24/16   Eldon Greenland, MD  albuterol  (PROVENTIL ) (2.5 MG/3ML) 0.083% nebulizer solution Take 3 mLs (2.5 mg total) by nebulization every 6 (six) hours as needed for wheezing or shortness of breath. 08/02/18   Olalere, Adewale A, MD  allopurinol  (ZYLOPRIM ) 300 MG tablet Take 300 mg by mouth daily.    [provider]  aspirin  81 MG tablet Take 81 mg by mouth daily.    [provider]  budesonide  (PULMICORT ) 0.5 MG/2ML nebulizer solution INHALE TWO MILLILITERS VIA NEBULIZATION BY MOUTH TWICE A DAY 01/02/19   Olalere, Adewale A, MD  buPROPion  (WELLBUTRIN  XL) 150 MG 24 hr tablet Take 1 tablet by mouth every morning.    [provider]  esomeprazole  (NEXIUM ) 40 MG capsule TAKE ONE CAPSULE BY MOUTH DAILY IN THE MORNING Patient taking differently: Take 40 mg by mouth daily. 07/19/14   Asencion Blacksmith, MD  ezetimibe  (ZETIA ) 10 MG tablet Take 1 tablet (10 mg total) by mouth daily. 03/23/23 06/21/23  Knox Perl, MD   fluticasone  (FLONASE ) 50 MCG/ACT nasal spray Place 1 spray into both nostrils daily. Patient taking differently: Place 1 spray into both nostrils daily as needed for allergies. 04/02/17   Sood, Vineet, MD  furosemide  (LASIX ) 40 MG tablet Take 1 tablet (40 mg total) by mouth daily. 11/19/22   Knox Perl, MD  metoprolol  succinate (TOPROL -XL) 25 MG 24 hr tablet Take 1 tablet (25 mg total) by mouth daily. 03/23/23   Knox Perl, MD  Pirfenidone  267 MG TABS Take 3 tablets (801 mg total) by mouth 3 (three) times daily with meals. 02/25/23   Ramaswamy, Murali, MD  polyethylene glycol (MIRALAX  / GLYCOLAX ) 17 g packet Take 17 g by mouth daily as needed for mild constipation. 04/25/23   Doroteo Gasmen, MD  predniSONE  (DELTASONE ) 10 MG tablet Prednisone  60 Mg p.o. once daily for 3 days, then 40 Mg p.o. once daily for 3 days, then 30 Mg p.o. once daily for 3 days, then 20 Mg p.o. once daily for 3 days, then 10 Mg p.o. once daily for 3 days and stop. 04/25/23   Doroteo Gasmen, MD  rosuvastatin  (CRESTOR ) 20 MG tablet Take 20 mg by mouth daily. 03/23/23   Knox Perl, MD  sacubitril -valsartan  (ENTRESTO ) 49-51 MG Take 1 tablet by mouth 2 (two) times daily. 01/21/23   Knox Perl, MD    No Known Allergies  REVIEW OF SYSTEMS:  General: no fevers/chills/night sweats Eyes: no blurry vision, diplopia, or amaurosis ENT: no sore throat or hearing loss Resp: no cough, wheezing, or hemoptysis CV: no edema or palpitations GI: no abdominal pain, nausea, vomiting, diarrhea, or constipation GU: no dysuria, frequency, or hematuria Skin: no rash Neuro: no headache, numbness, tingling, or weakness of extremities Musculoskeletal: no joint pain or swelling Heme: no bleeding, DVT, or easy bruising Endo: no polydipsia or polyuria  There were no vitals taken for this visit.  PHYSICAL EXAM: GEN:  AO x 3 in no acute distress HEENT: normal Dentition: Normal Neck: JVP normal. +2carotid upstrokes without bruits. No  thyromegaly. Lungs: equal expansion, clear bilaterally CV: Apex is discrete and nondisplaced, RRR without murmur or gallop Abd: soft, non-tender, non-distended; no bruit; positive bowel sounds Ext: no edema, ecchymoses, or cyanosis Vascular: 2+ femoral pulses, 2+  radial pulses       Skin: warm and dry without rash Neuro: CN II-XII grossly intact; motor and sensory grossly intact    DATA AND STUDIES:  EKG: April 2025 sinus rhythm with frequent PVCs  EKG Interpretation Date/Time:    Ventricular Rate:    PR Interval:    QRS Duration:    QT Interval:    QTC Calculation:   R Axis:      Text Interpretation:          Cardiac Studies & Procedures   ______________________________________________________________________________________________ CARDIAC CATHETERIZATION  CARDIAC CATHETERIZATION 11/19/2022  Conclusion   Prox RCA to Mid RCA lesion is 100% stenosed.   Prox LAD lesion is 80% stenosed.   Ost Cx to Prox Cx lesion is 20% stenosed.   Prox LAD to Mid LAD lesion is 90% stenosed.   2nd Diag lesion is 50% stenosed.   Mid LAD lesion is 80% stenosed.  Severe, heavily calcified stenosis throughout the proximal LAD. There is a moderate caliber Diagonal branch arising from the mid LAD. Mild non-obstructive plaque in the proximal Circumflex Large dominant RCA with dense calcification throughout the proximal and mid vessel. The vessel could not be selectively engaged but non-selective angiography demonstrates chronic total occlusion of the proximal vessel. The distal RCA and the PDA fills from left to right collaterals. Elevation of right and left heart pressures.  Recommendations: He has complex two vessel CAD with heavily calcified severe stenosis throughout the proximal LAD. The RCA is totally occluded. While PCI of the LAD with orbital atherectomy would be an option, I think we need to consider bypass surgery for complete revascularization. I will review his findings with Dr.  Berry Bristol next week and we will make further plans for PCI of the LAD vs CT surgery referral for bypass. For now, continue current medications. Will start Lasix  40 mg daily and plan BMET next week.  Findings Coronary Findings Diagnostic  Dominance: Right  Left Anterior Descending Vessel is large. Prox LAD lesion is 80% stenosed. The lesion is calcified. Prox LAD to Mid LAD lesion is 90% stenosed. The lesion is calcified. Mid LAD lesion is 80% stenosed.  Second Diagonal Branch 2nd Diag lesion is 50% stenosed.  Left Circumflex Vessel is large. Ost Cx to Prox Cx lesion is 20% stenosed.  Right Coronary Artery Vessel is large. Prox RCA to Mid RCA lesion is 100% stenosed. The lesion is chronically occluded. The lesion is calcified.  Third Right Posterolateral Branch Collaterals 3rd RPL filled by collaterals from 2nd Sept.  Intervention  No interventions have been documented.   STRESS TESTS  MYOCARDIAL PERFUSION IMAGING 10/20/2022  Narrative   Findings are consistent with infarction with peri-infarct ischemia. The study is high risk.   No ST deviation was noted.   LV perfusion is abnormal. There is no evidence of ischemia. There is evidence of infarction. Defect 1: There is a large defect with severe reduction in uptake present in the apical to basal inferior and apex location(s) that is partially reversible. There is abnormal wall motion in the defect area. Consistent with infarction and peri-infarct ischemia.   Left ventricular function is abnormal. Global function is severely reduced. There was a single regional abnormality. Nuclear stress EF: 27%. The left ventricular ejection fraction is severely decreased (<30%). End diastolic cavity size is severely enlarged. End systolic cavity size is severely enlarged.   Prior study not available for comparison.  Abnormal stress test with inferior and apical perfusion defects, which are partially reversible (  SDS 3), suggestive of infarct  without significant ischemia or possibly a degree of diaphragmatic attenuation (some improvement in stress upright images compared to stress supine). The LV is severely dilated and hypokinetic, worse inferiorly. LVEF 27% (echo correlation is advised). Further invasive evaluation may be warranted. No prior study for comparison.   ECHOCARDIOGRAM  ECHOCARDIOGRAM COMPLETE 04/20/2023  Narrative ECHOCARDIOGRAM REPORT    Patient Name:   Pietro Denzale Moya. Date of Exam: 04/20/2023 Medical Rec #:  [id number]         Height:       71.0 in Accession #:    [id number]        Weight:       221.6 lb Date of Birth:  08-05-1945         BSA:          2.202 m Patient Age:    48 years          BP:           106/64 mmHg Patient Gender: M                 HR:           74 bpm. Exam Location:  [address]  Procedure: 2D Echo and 3D Echo (Both Spectral and Color Flow Doppler were utilized during procedure).  Indications:     R06.00 Dyspnea  History:         Patient has prior history of Echocardiogram examinations, most recent 10/29/2022. COPD, Signs/Symptoms:Fatigue; Risk Factors:Hypertension, HLD and Sleep Apnea.  Sonographer:     Juventino Oppenheim RCS Referring Phys:  [id number] Knox Perl Diagnosing Phys: Gaylyn Keas MD  IMPRESSIONS   1. Left ventricular ejection fraction, by estimation, is 35 to 40%. Left ventricular ejection fraction by 3D volume is 36 %. The left ventricle has moderately decreased function. The left ventricle demonstrates global hypokinesis. The left ventricular internal cavity size was severely dilated. Left ventricular diastolic parameters are consistent with Grade II diastolic dysfunction (pseudonormalization). The average left ventricular global longitudinal strain is -18.2 %. 2. Right ventricular systolic function is normal. The right ventricular size is normal. There is normal pulmonary artery systolic pressure. 3. Left atrial size was moderately dilated. 4. The mitral valve is  degenerative. There is mild calcification of the mitral valve leaflet(s). Mild mitral annular calcification. Moderate to moderately severe mitral valve regurgitation. No evidence of mitral stenosis 5. The aortic valve is tricuspid. Aortic valve regurgitation is mild. Aortic valve sclerosis/calcification is present, without any evidence of aortic stenosis. 6. The inferior vena cava is normal in size with greater than 50% respiratory variability, suggesting right atrial pressure of 3 mmHg. 7. Visually MR appears at least moderate but possibly moderately severe. PISA MR ERO 0.27cm2. MR RF 34cc. 8. Compared to echo dated 10/29/2022, Mitral regurgitation has significantly worsened. Consider TEE.  FINDINGS Left Ventricle: Left ventricular ejection fraction, by estimation, is 35 to 40%. Left ventricular ejection fraction by 3D volume is 36 %. The left ventricle has moderately decreased function. The left ventricle demonstrates global hypokinesis. The average left ventricular global longitudinal strain is -18.2 %. Strain was performed and the global longitudinal strain is indeterminate. The left ventricular internal cavity size was severely dilated. There is no left ventricular hypertrophy. Left ventricular diastolic parameters are consistent with Grade II diastolic dysfunction (pseudonormalization). Normal left ventricular filling pressure.  Right Ventricle: The right ventricular size is normal. No increase in right ventricular wall thickness. Right ventricular systolic function is normal.  There is normal pulmonary artery systolic pressure. The tricuspid regurgitant velocity is 2.51 m/s, and with an assumed right atrial pressure of 3 mmHg, the estimated right ventricular systolic pressure is 28.2 mmHg.  Left Atrium: Left atrial size was moderately dilated.  Right Atrium: Right atrial size was normal in size.  Pericardium: There is no evidence of pericardial effusion.  Mitral Valve: The mitral valve is  degenerative in appearance. There is mild calcification of the mitral valve leaflet(s). Mild mitral annular calcification. Moderate mitral valve regurgitation. No evidence of mitral valve stenosis.  Tricuspid Valve: The tricuspid valve is normal in structure. Tricuspid valve regurgitation is mild . No evidence of tricuspid stenosis.  Aortic Valve: The aortic valve is tricuspid. Aortic valve regurgitation is mild. Aortic valve sclerosis/calcification is present, without any evidence of aortic stenosis.  Pulmonic Valve: The pulmonic valve was normal in structure. Pulmonic valve regurgitation is mild. No evidence of pulmonic stenosis.  Aorta: The aortic root is normal in size and structure.  Venous: The inferior vena cava is normal in size with greater than 50% respiratory variability, suggesting right atrial pressure of 3 mmHg.  IAS/Shunts: No atrial level shunt detected by color flow Doppler.  Additional Comments: 3D was performed not requiring image post processing on an independent workstation and was abnormal.   LEFT VENTRICLE PLAX 2D LVIDd:         7.00 cm         Diastology LVIDs:         5.80 cm         LV e' medial:    6.31 cm/s LV PW:         1.00 cm         LV E/e' medial:  19.8 LV IVS:        0.70 cm         LV e' lateral:   9.90 cm/s LVOT diam:     2.00 cm         LV E/e' lateral: 12.6 LV SV:         43 LV SV Index:   19              2D Longitudinal LVOT Area:     3.14 cm        Strain 2D Strain GLS   -15.8 % (A4C): 2D Strain GLS   -24.9 % (A3C): 2D Strain GLS   -14.0 % (A2C): 2D Strain GLS   -18.2 % Avg:  3D Volume EF LV 3D EF:    Left ventricul ar ejection fraction by 3D volume is 36 %.  3D Volume EF: 3D EF:        36 % LV EDV:       202 ml LV ESV:       129 ml LV SV:        73 ml  RIGHT VENTRICLE RV Basal diam:  3.60 cm RV S prime:     13.20 cm/s TAPSE (M-mode): 2.2 cm RVSP:           28.2 mmHg  LEFT ATRIUM              Index        RIGHT ATRIUM            Index LA diam:        4.50 cm  2.04 cm/m   RA Pressure: 3.00 mmHg LA Vol (A2C):   105.0 ml 47.67 ml/m  RA Area:  15.10 cm LA Vol (A4C):   91.0 ml  41.32 ml/m  RA Volume:   38.70 ml  17.57 ml/m LA Biplane Vol: 97.6 ml  44.31 ml/m AORTIC VALVE LVOT Vmax:   57.50 cm/s LVOT Vmean:  38.000 cm/s LVOT VTI:    0.136 m  AORTA Ao Root diam: 3.20 cm Ao Asc diam:  3.50 cm  MITRAL VALVE                  TRICUSPID VALVE MV Area (PHT):                TR Peak grad:   25.2 mmHg MV Decel Time:                TR Vmax:        251.00 cm/s MR Peak grad:    85.0 mmHg    Estimated RAP:  3.00 mmHg MR Mean grad:    58.0 mmHg    RVSP:           28.2 mmHg MR Vmax:         461.00 cm/s MR Vmean:        360.0 cm/s   SHUNTS MR PISA:         3.08 cm     Systemic VTI:  0.14 m MR PISA Eff ROA: 23 mm       Systemic Diam: 2.00 cm MR PISA Radius:  0.70 cm MV E velocity: 125.00 cm/s MV A velocity: 38.30 cm/s MV E/A ratio:  3.26  Gaylyn Keas MD Electronically signed by Gaylyn Keas MD Signature Date/Time: 04/20/2023/12:40:53 PM    Final (Updated)          ______________________________________________________________________________________________      11/13/2022: NT-Pro BNP 400; TSH 1.610 04/23/2023: ALT 16; B Natriuretic Peptide 707.9 04/24/2023: Magnesium  1.8 04/25/2023: BUN 15; Creatinine, Ser 0.79; Hemoglobin 14.7; Platelets 223; Potassium 4.0; Sodium 137   STS RISK CALCULATOR: Pending  NHYA CLASS: 2/3     ASSESSMENT AND PLAN:   1. Mitral valve insufficiency, unspecified etiology   2. HFrEF (heart failure with reduced ejection fraction) (HCC)   3. Coronary artery disease involving native coronary artery of native heart with other form of angina pectoris (HCC)   4. Hyperlipidemia LDL goal <70   5. Aortic atherosclerosis (HCC)   6. IPF (idiopathic pulmonary fibrosis) (HCC)   7. Chronic obstructive pulmonary disease, unspecified COPD type (HCC)     MR: The patient has  multiple reasons for lifestyle limiting dyspnea including severe ischemic mitral regurgitation as well as significant lung disease.  I agree with Dr. Berry Bristol that he patient is not a surgical candidate.  I will refer the patient for a TEE.  His blood pressure is low today and we are actually going to have him hold medications and restart them at lower doses.  This may suggest that optimal medical therapy will be somewhat limited.  I will refer the patient to advanced heart failure for their opinion.  If they make medication changes we will definitely need to obtain a repeat echocardiogram prior to mitral transcatheter edge-to-edge repair.  The patient and his wife are very much interested in mitral repair when possible. Ischemic cardiomyopathy: Patient's blood pressure was quite low today however he is completely asymptomatic.  I will have the patient hold metoprolol , Lasix , and Entresto  for the next 3 days.  I have asked him to increase his p.o. intake.  After that we will start Lasix  at 20 mg, Toprol  at  12.5 mg at bedtime, and heart losartan  12.5 mg at bedtime.  The patient will return for blood pressure check next week.  He looks to be dry today.  Will refer to advanced heart failure for further recommendations. Coronary artery disease: I agree that revascularization in this particular situation will likely be of no benefit in terms of improving ejection fraction.  He is asymptomatic from an angina perspective.  Not a candidate for surgical revascularization given comorbidities.  Continue aspirin  81 mg, Toprol  dose as above, rosuvastatin  20 mg.  I would not pursue revascularization in this case. Hyperlipidemia: Continue rosuvastatin  20 mg daily. Aortic atherosclerosis: Continue aspirin  81 mg, rosuvastatin  20 mg, and strict blood pressure control. Idiopathic pulmonary fibrosis: On antifibrotic pirfenidone  per pulmonary. COPD: Continue Pulmicort , albuterol .  I have personally reviewed the patients imaging  data as summarized above.  I have reviewed the natural history of mitral regurgitation with the patient and family members who are present today. We have discussed the limitations of medical therapy and the poor prognosis associated with symptomatic mitral regurgitation. We have also reviewed potential treatment options, including palliative medical therapy, conventional mitral surgery, and transcatheter mitral edge-to-edge repair. We discussed treatment options in the context of this patient's specific comorbid medical conditions.   All of the patient's questions were answered today. Will make further recommendations based on the results of studies outlined above.   I spent 50 minutes reviewing all clinical data during and prior to this visit including all relevant imaging studies, laboratories, clinical information from other health systems and prior notes from both Cardiology and other specialties, interviewing the patient, conducting a complete physical examination, and coordinating care in order to formulate a comprehensive and personalized evaluation and treatment plan.   Larna Capelle K Lajuanna Pompa, MD  04/27/2023 3:49 PM    [facility] [address] [place], [place], [place]  [address] Phone: [phone]; Fax: [phone]

## 2023-04-27 NOTE — Progress Notes (Signed)
 Specialty Pharmacy Refill Coordination Note  Jose Hines. is a 78 y.o. male contacted today regarding refills of specialty medication(s) Pirfenidone    Patient requested (Patient-Rptd) Delivery   Delivery date: (Patient-Rptd) 05/07/23   Verified address: (Patient-Rptd) [address], [place] [address]   Medication will be filled on 05/06/23.

## 2023-04-27 NOTE — Progress Notes (Unsigned)
 Patient ID: Jose O Bayona Jr. MRN: [id number] DOB/AGE: 10-Nov-1945 78 y.o.  Primary Care Physician:Swayne, Myrtie Atkinson, MD Primary Cardiologist: Berry Bristol  CC:  Mitral valvular disease management     FOCUSED PROBLEM LIST:   Ischemic cardiomyopathy EF 35 to 40% CAD CTO dominant RCA, high-grade proximal LAD disease cath November 2024 Mitral regurgitation Moderate to severe ([place]ent[place] IIIB) TTE April 2025 Mean wedge pressure 19; V waves 24 RHC November 2024 Hyperlipidemia Aortic atherosclerosis Chest CT 2024 Idiopathic pulmonary fibrosis COPD  April 2025:  Patient consents to use of AI scribe.*** Patient is a 78 year old male with the above listed medical problems referred by Dr. Berry Bristol for recommendations regarding his mitral regurgitation.  Patient has a complex medical history as summarized above.  He first saw Dr. Berry Bristol in October 2024 for dyspnea.  He ultimately was referred to pulmonology and diagnosed with idiopathic pulmonary fibrosis along with COPD.  Echocardiogram demonstrated ejection fraction of 30 to 35%.  He underwent coronary angiography which demonstrated an occluded dominant right coronary artery with high-grade LAD disease.  Echocardiogram more recently demonstrated moderate to severe mitral regurgitation.  He was started on goal-directed medical therapy consisting of mid dose Entresto  and Toprol  which was recently increased to 25 mg.  He is referred for further recommendations.       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   Alcoholic polyneuropathy (HCC)    Allergy    Asthma    BPH (benign prostatic hyperplasia)    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 failure) (HCC) 04/23/2023   COPD (chronic obstructive pulmonary disease) (HCC)    Depression    Depression    Diverticulosis    Dyspnea on exertion    Elevated LDL cholesterol level    GERD with stricture    Gout    Hiatal hernia    Hypertension    IPF  (idiopathic pulmonary fibrosis) (HCC) 04/23/2023   Kidney stones    2 times - passed stones, no surgery required   Lumbar and sacral osteoarthritis    Mitral regurgitation 04/23/2023   OSA on CPAP 04/23/2023   Peripheral polyneuropathy    Sleep apnea    wears CPAP   Wears hearing aid    Bil    Past Surgical History:  Procedure Laterality Date   COLONOSCOPY     INGUINAL HERNIA REPAIR     POLYPECTOMY     RIGHT/LEFT HEART CATH AND CORONARY ANGIOGRAPHY N/A 11/19/2022   Procedure: RIGHT/LEFT HEART CATH AND CORONARY ANGIOGRAPHY;  Surgeon: Odie Benne, MD;  Location: [facility];  Service: Cardiovascular;  Laterality: N/A;   TONSILLECTOMY AND ADENOIDECTOMY      Family History  Problem Relation Age of Onset   Transient ischemic attack Mother    Heart disease Father 43   Other Son        car wreck   Colon cancer Neg Hx    Esophageal cancer Neg Hx    Rectal cancer Neg Hx    Stomach cancer Neg Hx     Social History   Socioeconomic History   Marital status: Married    Spouse name: Not on file   Number of children: 2   Years of education: Not on file   Highest education level: Not on file  Occupational History   Occupation: Engineer, site  Tobacco Use   Smoking status: Former    Current packs/day: 0.00    Average packs/day: 2.0 packs/day for  10.0 years (20.0 ttl pk-yrs)    Types: Cigarettes    Start date: 12/29/1958    Quit date: 12/28/1968    Years since quitting: 54.3   Smokeless tobacco: Never  Vaping Use   Vaping status: Never Used  Substance and Sexual Activity   Alcohol use: Yes    Alcohol/week: 7.0 - 14.0 standard drinks of alcohol    Types: 7 - 14 Standard drinks or equivalent per week    Comment: bourbon   Drug use: No   Sexual activity: Not on file  Other Topics Concern   Not on file  Social History Narrative   Not on file   Social Drivers of Health   Financial Resource Strain: Not on file  Food Insecurity: No Food Insecurity  (04/23/2023)   Hunger Vital Sign    Worried About Running Out of Food in the Last Year: Never true    Ran Out of Food in the Last Year: Never true  Transportation Needs: No Transportation Needs (04/23/2023)   PRAPARE - Administrator, Civil Service (Medical): No    Lack of Transportation (Non-Medical): No  Physical Activity: Not on file  Stress: Not on file  Social Connections: Moderately Isolated (04/23/2023)   Social Connection and Isolation Panel [NHANES]    Frequency of Communication with Friends and Family: More than three times a week    Frequency of Social Gatherings with Friends and Family: More than three times a week    Attends Religious Services: Never    Database administrator or Organizations: No    Attends Banker Meetings: Never    Marital Status: Married  Catering manager Violence: Not At Risk (04/23/2023)   Humiliation, Afraid, Rape, and Kick questionnaire    Fear of Current or Ex-Partner: No    Emotionally Abused: No    Physically Abused: No    Sexually Abused: No     Prior to Admission medications   Medication Sig Start Date End Date Taking? Authorizing Provider  albuterol  (PROVENTIL  HFA;VENTOLIN  HFA) 108 (90 Base) MCG/ACT inhaler Inhale 1-2 puffs into the lungs every 6 (six) hours as needed for wheezing or shortness of breath. 12/24/16   Eldon Greenland, MD  albuterol  (PROVENTIL ) (2.5 MG/3ML) 0.083% nebulizer solution Take 3 mLs (2.5 mg total) by nebulization every 6 (six) hours as needed for wheezing or shortness of breath. 08/02/18   Olalere, Adewale A, MD  allopurinol  (ZYLOPRIM ) 300 MG tablet Take 300 mg by mouth daily.    [provider]  aspirin  81 MG tablet Take 81 mg by mouth daily.    [provider]  budesonide  (PULMICORT ) 0.5 MG/2ML nebulizer solution INHALE TWO MILLILITERS VIA NEBULIZATION BY MOUTH TWICE A DAY 01/02/19   Olalere, Adewale A, MD  buPROPion  (WELLBUTRIN  XL) 150 MG 24 hr tablet Take 1 tablet by mouth  every morning.    [provider]  esomeprazole  (NEXIUM ) 40 MG capsule TAKE ONE CAPSULE BY MOUTH DAILY IN THE MORNING Patient taking differently: Take 40 mg by mouth daily. 07/19/14   Asencion Blacksmith, MD  ezetimibe  (ZETIA ) 10 MG tablet Take 1 tablet (10 mg total) by mouth daily. 03/23/23 06/21/23  Knox Perl, MD  fluticasone  (FLONASE ) 50 MCG/ACT nasal spray Place 1 spray into both nostrils daily. Patient taking differently: Place 1 spray into both nostrils daily as needed for allergies. 04/02/17   Sood, Vineet, MD  furosemide  (LASIX ) 40 MG tablet Take 1 tablet (40 mg total) by mouth daily. 11/19/22  Knox Perl, MD  metoprolol  succinate (TOPROL -XL) 25 MG 24 hr tablet Take 1 tablet (25 mg total) by mouth daily. 03/23/23   Knox Perl, MD  Pirfenidone  267 MG TABS Take 3 tablets (801 mg total) by mouth 3 (three) times daily with meals. 02/25/23   Ramaswamy, Murali, MD  polyethylene glycol (MIRALAX  / GLYCOLAX ) 17 g packet Take 17 g by mouth daily as needed for mild constipation. 04/25/23   Doroteo Gasmen, MD  predniSONE  (DELTASONE ) 10 MG tablet Prednisone  60 Mg p.o. once daily for 3 days, then 40 Mg p.o. once daily for 3 days, then 30 Mg p.o. once daily for 3 days, then 20 Mg p.o. once daily for 3 days, then 10 Mg p.o. once daily for 3 days and stop. 04/25/23   Doroteo Gasmen, MD  rosuvastatin  (CRESTOR ) 20 MG tablet Take 20 mg by mouth daily. 03/23/23   Knox Perl, MD  sacubitril -valsartan  (ENTRESTO ) 49-51 MG Take 1 tablet by mouth 2 (two) times daily. 01/21/23   Knox Perl, MD    No Known Allergies  REVIEW OF SYSTEMS:  General: no fevers/chills/night sweats Eyes: no blurry vision, diplopia, or amaurosis ENT: no sore throat or hearing loss Resp: no cough, wheezing, or hemoptysis CV: no edema or palpitations GI: no abdominal pain, nausea, vomiting, diarrhea, or constipation GU: no dysuria, frequency, or hematuria Skin: no rash Neuro: no headache, numbness, tingling, or weakness of  extremities Musculoskeletal: no joint pain or swelling Heme: no bleeding, DVT, or easy bruising Endo: no polydipsia or polyuria  There were no vitals taken for this visit.  PHYSICAL EXAM: GEN:  AO x 3 in no acute distress HEENT: normal Dentition: Normal*** Neck: JVP normal. +2***carotid upstrokes without bruits. No thyromegaly. Lungs: equal expansion, clear bilaterally CV: Apex is discrete and nondisplaced, RRR without murmur or gallop*** Abd: soft, non-tender, non-distended; no bruit; positive bowel sounds Ext: no edema, ecchymoses, or cyanosis Vascular: 2+ femoral pulses, 2+ radial pulses       Skin: warm and dry without rash Neuro: CN II-XII grossly intact; motor and sensory grossly intact    DATA AND STUDIES:  EKG: April 2025 sinus rhythm with frequent PVCs  EKG Interpretation Date/Time:    Ventricular Rate:    PR Interval:    QRS Duration:    QT Interval:    QTC Calculation:   R Axis:      Text Interpretation:          Cardiac Studies & Procedures   ______________________________________________________________________________________________ CARDIAC CATHETERIZATION  CARDIAC CATHETERIZATION 11/19/2022  Conclusion   Prox RCA to Mid RCA lesion is 100% stenosed.   Prox LAD lesion is 80% stenosed.   Ost Cx to Prox Cx lesion is 20% stenosed.   Prox LAD to Mid LAD lesion is 90% stenosed.   2nd Diag lesion is 50% stenosed.   Mid LAD lesion is 80% stenosed.  Severe, heavily calcified stenosis throughout the proximal LAD. There is a moderate caliber Diagonal branch arising from the mid LAD. Mild non-obstructive plaque in the proximal Circumflex Large dominant RCA with dense calcification throughout the proximal and mid vessel. The vessel could not be selectively engaged but non-selective angiography demonstrates chronic total occlusion of the proximal vessel. The distal RCA and the PDA fills from left to right collaterals. Elevation of right and left heart  pressures.  Recommendations: He has complex two vessel CAD with heavily calcified severe stenosis throughout the proximal LAD. The RCA is totally occluded. While PCI of the LAD with orbital  atherectomy would be an option, I think we need to consider bypass surgery for complete revascularization. I will review his findings with Dr. Berry Bristol next week and we will make further plans for PCI of the LAD vs CT surgery referral for bypass. For now, continue current medications. Will start Lasix  40 mg daily and plan BMET next week.  Findings Coronary Findings Diagnostic  Dominance: Right  Left Anterior Descending Vessel is large. Prox LAD lesion is 80% stenosed. The lesion is calcified. Prox LAD to Mid LAD lesion is 90% stenosed. The lesion is calcified. Mid LAD lesion is 80% stenosed.  Second Diagonal Branch 2nd Diag lesion is 50% stenosed.  Left Circumflex Vessel is large. Ost Cx to Prox Cx lesion is 20% stenosed.  Right Coronary Artery Vessel is large. Prox RCA to Mid RCA lesion is 100% stenosed. The lesion is chronically occluded. The lesion is calcified.  Third Right Posterolateral Branch Collaterals 3rd RPL filled by collaterals from 2nd Sept.  Intervention  No interventions have been documented.   STRESS TESTS  MYOCARDIAL PERFUSION IMAGING 10/20/2022  Narrative   Findings are consistent with infarction with peri-infarct ischemia. The study is high risk.   No ST deviation was noted.   LV perfusion is abnormal. There is no evidence of ischemia. There is evidence of infarction. Defect 1: There is a large defect with severe reduction in uptake present in the apical to basal inferior and apex location(s) that is partially reversible. There is abnormal wall motion in the defect area. Consistent with infarction and peri-infarct ischemia.   Left ventricular function is abnormal. Global function is severely reduced. There was a single regional abnormality. Nuclear stress EF: 27%. The  left ventricular ejection fraction is severely decreased (<30%). End diastolic cavity size is severely enlarged. End systolic cavity size is severely enlarged.   Prior study not available for comparison.  Abnormal stress test with inferior and apical perfusion defects, which are partially reversible (SDS 3), suggestive of infarct without significant ischemia or possibly a degree of diaphragmatic attenuation (some improvement in stress upright images compared to stress supine). The LV is severely dilated and hypokinetic, worse inferiorly. LVEF 27% (echo correlation is advised). Further invasive evaluation may be warranted. No prior study for comparison.   ECHOCARDIOGRAM  ECHOCARDIOGRAM COMPLETE 04/20/2023  Narrative ECHOCARDIOGRAM REPORT    Patient Name:   Jose Hines. Date of Exam: 04/20/2023 Medical Rec #:  [id number]         Height:       71.0 in Accession #:    [id number]        Weight:       221.6 lb Date of Birth:  Jun 27, 1945         BSA:          2.202 m Patient Age:    57 years          BP:           106/64 mmHg Patient Gender: M                 HR:           74 bpm. Exam Location:  [address]  Procedure: 2D Echo and 3D Echo (Both Spectral and Color Flow Doppler were utilized during procedure).  Indications:     R06.00 Dyspnea  History:         Patient has prior history of Echocardiogram examinations, most recent 10/29/2022. COPD, Signs/Symptoms:Fatigue; Risk Factors:Hypertension, HLD and Sleep Apnea.  Sonographer:     Juventino Oppenheim RCS Referring Phys:  [id number] Knox Perl Diagnosing Phys: Gaylyn Keas MD  IMPRESSIONS   1. Left ventricular ejection fraction, by estimation, is 35 to 40%. Left ventricular ejection fraction by 3D volume is 36 %. The left ventricle has moderately decreased function. The left ventricle demonstrates global hypokinesis. The left ventricular internal cavity size was severely dilated. Left ventricular diastolic parameters are consistent with  Grade II diastolic dysfunction (pseudonormalization). The average left ventricular global longitudinal strain is -18.2 %. 2. Right ventricular systolic function is normal. The right ventricular size is normal. There is normal pulmonary artery systolic pressure. 3. Left atrial size was moderately dilated. 4. The mitral valve is degenerative. There is mild calcification of the mitral valve leaflet(s). Mild mitral annular calcification. Moderate to moderately severe mitral valve regurgitation. No evidence of mitral stenosis 5. The aortic valve is tricuspid. Aortic valve regurgitation is mild. Aortic valve sclerosis/calcification is present, without any evidence of aortic stenosis. 6. The inferior vena cava is normal in size with greater than 50% respiratory variability, suggesting right atrial pressure of 3 mmHg. 7. Visually MR appears at least moderate but possibly moderately severe. PISA MR ERO 0.27cm2. MR RF 34cc. 8. Compared to echo dated 10/29/2022, Mitral regurgitation has significantly worsened. Consider TEE.  FINDINGS Left Ventricle: Left ventricular ejection fraction, by estimation, is 35 to 40%. Left ventricular ejection fraction by 3D volume is 36 %. The left ventricle has moderately decreased function. The left ventricle demonstrates global hypokinesis. The average left ventricular global longitudinal strain is -18.2 %. Strain was performed and the global longitudinal strain is indeterminate. The left ventricular internal cavity size was severely dilated. There is no left ventricular hypertrophy. Left ventricular diastolic parameters are consistent with Grade II diastolic dysfunction (pseudonormalization). Normal left ventricular filling pressure.  Right Ventricle: The right ventricular size is normal. No increase in right ventricular wall thickness. Right ventricular systolic function is normal. There is normal pulmonary artery systolic pressure. The tricuspid regurgitant velocity is 2.51  m/s, and with an assumed right atrial pressure of 3 mmHg, the estimated right ventricular systolic pressure is 28.2 mmHg.  Left Atrium: Left atrial size was moderately dilated.  Right Atrium: Right atrial size was normal in size.  Pericardium: There is no evidence of pericardial effusion.  Mitral Valve: The mitral valve is degenerative in appearance. There is mild calcification of the mitral valve leaflet(s). Mild mitral annular calcification. Moderate mitral valve regurgitation. No evidence of mitral valve stenosis.  Tricuspid Valve: The tricuspid valve is normal in structure. Tricuspid valve regurgitation is mild . No evidence of tricuspid stenosis.  Aortic Valve: The aortic valve is tricuspid. Aortic valve regurgitation is mild. Aortic valve sclerosis/calcification is present, without any evidence of aortic stenosis.  Pulmonic Valve: The pulmonic valve was normal in structure. Pulmonic valve regurgitation is mild. No evidence of pulmonic stenosis.  Aorta: The aortic root is normal in size and structure.  Venous: The inferior vena cava is normal in size with greater than 50% respiratory variability, suggesting right atrial pressure of 3 mmHg.  IAS/Shunts: No atrial level shunt detected by color flow Doppler.  Additional Comments: 3D was performed not requiring image post processing on an independent workstation and was abnormal.   LEFT VENTRICLE PLAX 2D LVIDd:         7.00 cm         Diastology LVIDs:         5.80 cm  LV e' medial:    6.31 cm/s LV PW:         1.00 cm         LV E/e' medial:  19.8 LV IVS:        0.70 cm         LV e' lateral:   9.90 cm/s LVOT diam:     2.00 cm         LV E/e' lateral: 12.6 LV SV:         43 LV SV Index:   19              2D Longitudinal LVOT Area:     3.14 cm        Strain 2D Strain GLS   -15.8 % (A4C): 2D Strain GLS   -24.9 % (A3C): 2D Strain GLS   -14.0 % (A2C): 2D Strain GLS   -18.2 % Avg:  3D Volume EF LV 3D EF:     Left ventricul ar ejection fraction by 3D volume is 36 %.  3D Volume EF: 3D EF:        36 % LV EDV:       202 ml LV ESV:       129 ml LV SV:        73 ml  RIGHT VENTRICLE RV Basal diam:  3.60 cm RV S prime:     13.20 cm/s TAPSE (M-mode): 2.2 cm RVSP:           28.2 mmHg  LEFT ATRIUM              Index        RIGHT ATRIUM           Index LA diam:        4.50 cm  2.04 cm/m   RA Pressure: 3.00 mmHg LA Vol (A2C):   105.0 ml 47.67 ml/m  RA Area:     15.10 cm LA Vol (A4C):   91.0 ml  41.32 ml/m  RA Volume:   38.70 ml  17.57 ml/m LA Biplane Vol: 97.6 ml  44.31 ml/m AORTIC VALVE LVOT Vmax:   57.50 cm/s LVOT Vmean:  38.000 cm/s LVOT VTI:    0.136 m  AORTA Ao Root diam: 3.20 cm Ao Asc diam:  3.50 cm  MITRAL VALVE                  TRICUSPID VALVE MV Area (PHT):                TR Peak grad:   25.2 mmHg MV Decel Time:                TR Vmax:        251.00 cm/s MR Peak grad:    85.0 mmHg    Estimated RAP:  3.00 mmHg MR Mean grad:    58.0 mmHg    RVSP:           28.2 mmHg MR Vmax:         461.00 cm/s MR Vmean:        360.0 cm/s   SHUNTS MR PISA:         3.08 cm     Systemic VTI:  0.14 m MR PISA Eff ROA: 23 mm       Systemic Diam: 2.00 cm MR PISA Radius:  0.70 cm MV E velocity: 125.00 cm/s MV A velocity: 38.30 cm/s MV E/A ratio:  3.26  Traci  Turner MD Electronically signed by Gaylyn Keas MD Signature Date/Time: 04/20/2023/12:40:53 PM    Final (Updated)          ______________________________________________________________________________________________      11/13/2022: NT-Pro BNP 400; TSH 1.610 04/23/2023: ALT 16; B Natriuretic Peptide 707.9 04/24/2023: Magnesium  1.8 04/25/2023: BUN 15; Creatinine, Ser 0.79; Hemoglobin 14.7; Platelets 223; Potassium 4.0; Sodium 137   STS RISK CALCULATOR: Pending  NHYA CLASS: ***     ASSESSMENT AND PLAN:   1. Mitral valve insufficiency, unspecified etiology   2. HFrEF (heart failure with reduced ejection fraction)  (HCC)   3. Coronary artery disease involving native coronary artery of native heart with other form of angina pectoris (HCC)   4. Hyperlipidemia LDL goal <70   5. Aortic atherosclerosis (HCC)   6. IPF (idiopathic pulmonary fibrosis) (HCC)   7. Chronic obstructive pulmonary disease, unspecified COPD type (HCC)     MR:*** Ischemic cardiomyopathy: Continue Toprol  25 mg daily, Lasix  40 mg daily, Entresto  49 x 51 mg twice daily.  Start Jardiance 10 mg daily.  Will refer to advanced heart failure for further recommendations. Coronary artery disease: I agree that revascularization in this particular situation will likely be of no benefit in terms of improving ejection fraction.  He is asymptomatic from an angina perspective.  Not a candidate for surgical revascularization given comorbidities.  Continue aspirin  81 mg, Toprol  25 mg, rosuvastatin  20 mg. Hyperlipidemia: Continue rosuvastatin  20 mg daily. Aortic atherosclerosis: Continue aspirin  81 mg, rosuvastatin  20 mg, and strict blood pressure control. Idiopathic pulmonary fibrosis: On antifibrotic pirfenidone  per pulmonary. COPD: Continue Pulmicort , albuterol .  I have personally reviewed the patients imaging data as summarized above.  I have reviewed the natural history of mitral regurgitation with the patient and family members who are present today. We have discussed the limitations of medical therapy and the poor prognosis associated with symptomatic mitral regurgitation. We have also reviewed potential treatment options, including palliative medical therapy, conventional mitral surgery, and transcatheter mitral edge-to-edge repair. We discussed treatment options in the context of this patient's specific comorbid medical conditions.   All of the patient's questions were answered today. Will make further recommendations based on the results of studies outlined above.   I spent *** minutes reviewing all clinical data during and prior to this visit  including all relevant imaging studies, laboratories, clinical information from other health systems and prior notes from both Cardiology and other specialties, interviewing the patient, conducting a complete physical examination, and coordinating care in order to formulate a comprehensive and personalized evaluation and treatment plan.   Emeline Simpson K Alona Danford, MD  04/27/2023 3:49 PM    [facility] [address] [place], [place], [place]  [address] Phone: [phone]; Fax: [phone]

## 2023-04-29 ENCOUNTER — Other Ambulatory Visit: Payer: Self-pay

## 2023-04-29 ENCOUNTER — Encounter: Payer: Self-pay | Admitting: Internal Medicine

## 2023-04-29 ENCOUNTER — Ambulatory Visit: Attending: Internal Medicine | Admitting: Internal Medicine

## 2023-04-29 ENCOUNTER — Encounter: Payer: Self-pay | Admitting: *Deleted

## 2023-04-29 VITALS — BP 74/42 | HR 78 | Ht 71.0 in | Wt 210.6 lb

## 2023-04-29 DIAGNOSIS — E785 Hyperlipidemia, unspecified: Secondary | ICD-10-CM

## 2023-04-29 DIAGNOSIS — J84112 Idiopathic pulmonary fibrosis: Secondary | ICD-10-CM | POA: Diagnosis not present

## 2023-04-29 DIAGNOSIS — I340 Nonrheumatic mitral (valve) insufficiency: Secondary | ICD-10-CM | POA: Diagnosis not present

## 2023-04-29 DIAGNOSIS — I700 Atherosclerosis of aorta: Secondary | ICD-10-CM

## 2023-04-29 DIAGNOSIS — J449 Chronic obstructive pulmonary disease, unspecified: Secondary | ICD-10-CM | POA: Diagnosis not present

## 2023-04-29 DIAGNOSIS — I5020 Unspecified systolic (congestive) heart failure: Secondary | ICD-10-CM

## 2023-04-29 DIAGNOSIS — I25118 Atherosclerotic heart disease of native coronary artery with other forms of angina pectoris: Secondary | ICD-10-CM

## 2023-04-29 MED ORDER — FUROSEMIDE 20 MG PO TABS: 20.0000 mg | ORAL_TABLET | Freq: Every day | ORAL | 1 refills | Status: DC

## 2023-04-29 MED ORDER — METOPROLOL SUCCINATE ER 25 MG PO TB24: 12.5000 mg | ORAL_TABLET | Freq: Every day | ORAL | 1 refills | Status: DC

## 2023-04-29 MED ORDER — BUDESONIDE 0.5 MG/2ML IN SUSP: 0.5000 mg | Freq: Two times a day (BID) | RESPIRATORY_TRACT | 0 refills | Status: DC

## 2023-04-29 MED ORDER — LOSARTAN POTASSIUM 25 MG PO TABS: 12.5000 mg | ORAL_TABLET | Freq: Every day | ORAL | 3 refills | Status: DC

## 2023-04-29 NOTE — Progress Notes (Signed)
 Pre Surgical Assessment: 5 M Walk Test  48M=16.73ft  5 Meter Walk Test- trial 1: 8.54 seconds 5 Meter Walk Test- trial 2: 7.48 seconds 5 Meter Walk Test- trial 3: 7.18 seconds 5 Meter Walk Test Average: 7.33 seconds

## 2023-04-29 NOTE — Patient Instructions (Addendum)
 Medication Instructions:  Your physician has recommended you make the following change in your medication:  1.) stop Toprol  2.) stop Entresto  3.) stop Lasix   On Sunday April 27 start: 1.) Toprol  (metoprolol  succinate) 25 mg tablet --take half tab--12.5 mg - daily at bedtime 2.) losartan  25 mg tablet--take half tab--12.5 mg - daily at bedtime 3.) start Lasix  (furosemide ) 20 mg - one tablet daily  *If you need a refill on your cardiac medications before your next appointment, please call your pharmacy*  Lab Work: none   Testing/Procedures: Your physician has requested that you have a TEE. During a TEE, sound waves are used to create images of your heart. It provides your doctor with information about the size and shape of your heart and how well your heart's chambers and valves are working. In this test, a transducer is attached to the end of a flexible tube that's guided down your throat and into your esophagus (the tube leading from you mouth to your stomach) to get a more detailed image of your heart. You are not awake for the procedure. Please see the instruction sheet given to you today. For further information please visit [email or web address]   Follow-Up: Per Structural Heart Team  You have been referred to [facility] -they will contact you with your first appointment.    Other Instructions TEE 5/13 Dr. Alvis Ba Nurse Visit for blood pressure check in about 1 week (Fri 5/2)      1st Floor: - Lobby - Registration  - Pharmacy  - Lab - Cafe  2nd Floor: - PV Lab - Diagnostic Testing (echo, CT, nuclear med)  3rd Floor: - Vacant  4th Floor: - TCTS (cardiothoracic surgery) - AFib Clinic - Structural Heart Clinic - Vascular Surgery  - Vascular Ultrasound  5th Floor: - HeartCare Cardiology (general and EP) - Clinical Pharmacy for coumadin, hypertension, lipid, weight-loss medications, and med management appointments    Valet parking services  will be available as well.

## 2023-04-30 ENCOUNTER — Other Ambulatory Visit (INDEPENDENT_AMBULATORY_CARE_PROVIDER_SITE_OTHER)

## 2023-04-30 DIAGNOSIS — J84112 Idiopathic pulmonary fibrosis: Secondary | ICD-10-CM | POA: Diagnosis not present

## 2023-04-30 DIAGNOSIS — Z5181 Encounter for therapeutic drug level monitoring: Secondary | ICD-10-CM

## 2023-04-30 LAB — HEPATIC FUNCTION PANEL
ALT: 43 U/L (ref 0–53)
AST: 36 U/L (ref 0–37)
Albumin: 3.9 g/dL (ref 3.5–5.2)
Alkaline Phosphatase: 76 U/L (ref 39–117)
Bilirubin, Direct: 0.2 mg/dL (ref 0.0–0.3)
Total Bilirubin: 0.8 mg/dL (ref 0.2–1.2)
Total Protein: 6.7 g/dL (ref 6.0–8.3)

## 2023-04-30 NOTE — Telephone Encounter (Signed)
 8.30am on 05/03/23 Monday  16.30 on 05/05/23

## 2023-05-02 NOTE — Patient Instructions (Signed)
 ICD-10-CM   1. IPF (idiopathic pulmonary fibrosis) (HCC)  J84.112     2. ILD (interstitial lung disease) (HCC)  J84.9     3. Noisy breathing  R06.89     4. Preop respiratory exam  Z01.811      #IPF Giving you 02/18/2023 diagnosis of  idiopathic pulmonary fibrosis [IPF].  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   Plan - Start pirfenidone per protocol  #Preoperative respiratory exam  The fact you are able to sit and stand 1rtimes without dropping oxygen is somewhat reassuring in terms of being able to have a bypass surgery but overall risk for prolonged ventilator dependence is around moderate high.  This based on the fact of pulmonary fibrosis and also your age and the type of surgery.  Patients can have pulmonary fibrosis flareup   Plan - Moderate high risk for pulmonary complications after bypass surgery   #Noisy breathing even at rest -Abnormal flow-volume pattern on the PFT  Unclear cause and PFT shows obstruction when he has obvious and there is no emphysema described   Plan - Talk about this with the sleep doctor -Refer ENT -Based on above can consider inhaler therapy.   # Goals of care  -I think overall life expectancy is shortened because of cardiac disease with low ejection fraction and pulmonary fibrosis.  Currently the cardiac disease is a bigger issue.  In the presence of antifibrotic's patients can potentially live over 5 years to 10 years compared to the natural progression of this disease.  Therefore if surgery is needed for the heart it is probably worth taking the risk and having a bypass surgery   Follow-up - Return within 6 weeks to discuss progress and also initiation of antifibrotic's.  -Okay to see nurse practitioner

## 2023-05-02 NOTE — Progress Notes (Unsigned)
 OV 12/16/2022  Subjective:  Patient ID: Jose Hines., male , DOB: 1945-12-15 , age 78 y.o. , MRN: [id number] , ADDRESS: [address] [place] [address] PCP Rae Bugler, MD Patient Care Team: Rae Bugler, MD as PCP - General (Family Medicine) Knox Perl, MD as PCP - Cardiology (Cardiology)  This Provider for this visit: Treatment Team:  Attending Provider: Maire Scot, MD    12/16/2022 -   Chief Complaint  Patient presents with   Consult    Sob,and cough and chest congestion. Symptoms started a few weeks ago with sinus drainage      HPI Jose Hines. 78 y.o. -referred by Dr. Berry Bristol.  Dr. Berry Bristol is concerned about presence of interstitial lung disease and also requires preoperative clearance prior to potential cardiac bypass.  Patient affirms understanding of the same.  He is a retired Psychologist, forensic.  He is to Social research officer, government at [employer], [place] in high and also [place].  He believes these buildings were all in the might have been mold but he does not recall any visible mold.  He tells me throughout his life especially from high school he always was noticed to be somebody who would often hugg puff but never actually feel short of breath.  But he says he is had perceivable shortness of breath for the last few to several years particularly several years.  However in the last 3 months has been a decline he is more short of breath.  He says he was short of breath just mowing the grass and his wife had to tell him to go visit with the physician.  And that is how he was diagnosed with coronary artery disease and chronic systolic dysfunction.   He has a history of sleep apnea.  He uses CPAP at night but not seen a sleep doctor for over 15 years.  Sleep doctors at [facility].  On 11/19/2022 Dr. Abel Hoe did left heart catheterization and he was deemed to have complex two-vessel coronary artery disease with heavily calcified severe stenosis with  complete occlusion of the RCA.  Bypass surgery is being preferred.  He also had a nuclear medicine stress test 10/20/2022 and his ejection fraction is 27%.  He says he does not have any chest pain with exertion.  Echo around [facility] 2024 showed EF 30%.  At this moment in time he is reporting significant dyspnea on exertion and decreased energy.  Dyspnea is at least class III if he finds dyspnea for changing clothes and even taking a shower.  There is also associated cough.  On exam he does have crackles especially Velcro crackles at the lung base..     Margarito Shearing 02/18/2023  Subjective:  Patient ID: Jose Hines., male , DOB: December 02, 1945 , age 39 y.o. , MRN: [id number] , ADDRESS: [address] [place] [address] PCP Rae Bugler, MD Patient Care Team: Rae Bugler, MD as PCP - General (Family Medicine) Knox Perl, MD as PCP - Cardiology (Cardiology)  This Provider for this visit: Treatment Team:  Attending Provider: Maire Scot, MD    02/18/2023 -   Chief Complaint  Patient presents with   Follow-up    ILD F/U    HPI Jose Hines. 78 y.o. -returns for follow-up to review the test results.  His serology profile is normal.  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  I agree with this finding I personally  visualized it.  He says his shortness of breath is the same he is not any worse.  But he always seems to have noisy breathing.  Reviewed external medical record I noticed that even Dr. Berry Bristol and documented this.  Symptom score as below.  However, his exercise hypoxemia test is normal.  .  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  Indicated antifibrotic's indicated.  There are 2 nintedanib and pirfenidone .  I do not recommend nintedanib because of his coronary artery disease.  We discussed pirfenidone  and the need to assess sunscreen.  Went over nausea low appetite and weight loss and side effects along with rare diarrhea and also skin  rash.  Advised him of the dosing schedule.  I have contacted our pharmacy team about this.  The only discordant thing with his pulmonary workup is that his pulm function test shows obstruction.  He cough during expiration.  With inspiratory flow volume loop looks fine.  There is no emphysema described on the CT chest he was a remote smoker.  He seems at this noisy breathing just audible and seems to be coming from the upper airway.  He did not desaturate.  There could be something going on with his vocal cords.  All this could be asthma.  Will refer him to ENT.    However he also states that few weeks ago he fell down and and therefore he is walking with a limp.  He pointed towards his right knee.  I examined this area there is no deformities.  He is got good range of motion.  I advised him to talk to his primary care physician if he wanted an x-ray.  Reviewed Dr. Maida Sciara note Dr. Ganji is waiting for our evaluation on preoperative risk assessment.  Rarely anesthesia after cardiac surgery in the setting of IPF can cause IPF flareup.  However I think his life expectancy is shortened with his systolic heart failure currently that is more of a problem than his fibrosis.  But the combined is reducing his life expectancy.  With antifibrotic's his life expectancy can be improved.  Therefore recommending it.  If he needs surgery in terms of bypass he might have to take the risk as this might be the best time point for this.  The risk is not prohibitive but moderate high for prolonged ventilator dependence after cardiac surgery.  Miscellaneous comments x     CT Chest data from date:*  - personally visualized and independently interpreted : YES - my findings are: agree Narrative & Impression  CLINICAL DATA:  Diffuse/interstitial lung disease.  Chronic cough.   EXAM: CT CHEST WITHOUT CONTRAST   TECHNIQUE: Multidetector CT imaging of the chest was performed following the standard protocol without  intravenous contrast. High resolution imaging of the lungs, as well as inspiratory and expiratory imaging, was performed.   RADIATION DOSE REDUCTION: This exam was performed according to the departmental dose-optimization program which includes automated exposure control, adjustment of the mA and/or kV according to patient size and/or use of iterative reconstruction technique.   COMPARISON:  None available.   FINDINGS: Cardiovascular: Atherosclerotic calcification of the aorta, aortic valve and coronary arteries. Enlarged right and left pulmonary arteries. Heart is at the upper limits of normal in size to mildly enlarged. No pericardial effusion.   Mediastinum/Nodes: Calcified and enlarged noncalcified mediastinal and hilar lymph nodes. Hilar regions are difficult to definitively evaluate without IV contrast. No axillary adenopathy. Esophagus is grossly unremarkable.   Lungs/Pleura: Calcified granulomas.  Basilar subpleural reticulation, ground-glass, traction bronchiolectasis and suspected honeycombing. Scattered mucoid impaction. No pleural fluid. Airway is unremarkable. Expiratory phase imaging was not performed in true expiration, limiting the evaluation for air trapping.   Upper Abdomen: Visualized portions of the liver, gallbladder, adrenal glands, right kidney, spleen, pancreas, stomach and bowel are grossly unremarkable. No upper abdominal adenopathy.   Musculoskeletal: Degenerative changes in the spine.   IMPRESSION: 1. 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 Am Annie Barton Crit Care Med Vol 198, Iss 5, [phone], Sep 05 2016. 2. Aortic atherosclerosis (ICD10-I70.0). Coronary artery calcification. 3. Enlarged right and left pulmonary arteries, indicative  of pulmonary arterial hypertension.     Electronically Signed   By: Shearon Denis M.D.   On: 01/13/2023 12:36     Latest Reference Range & Units 12/21/22 14:20  IgE (Immunoglobulin E), Serum <OR=114 kU/L 13  Aspergillus Fumigatus NEGATIVE  NEGATIVE  Pigeon Serum NEGATIVE  NEGATIVE  Anti Nuclear Antibody (ANA) NEGATIVE  NEGATIVE  Cyclic Citrullin Peptide Ab UNITS <16  RA Latex Turbid. <14 IU/mL <10     OV 05/03/2023  Subjective:  Patient ID: Jose Hines., male , DOB: [birth year] , age 13 y.o. , MRN: [id number] , ADDRESS: [address] [place] [address] PCP Rae Bugler, MD Patient Care Team: Rae Bugler, MD as PCP - General (Family Medicine) Knox Perl, MD as PCP - Cardiology (Cardiology)  This Provider for this visit: Treatment Team:  Attending Provider: Maire Scot, MD  #Interstitial lung disease   -  02/18/2023 diagnosis of  idiopathic pulmonary fibrosis [IPF].  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 Ganji]  - Ejection fraction 35-40% April 2025  - Deemed too high risk for cardiac bypass by cardiology April 2025  #History of sleep apnea.  # Severe mitral regurgitation TEE pending summer/spring 2025.  05/03/2023 -   Chief Complaint  Patient presents with   Hospitalization Follow-up    Breathing has improved but not quite back to baseline.      HPI Brinson Carren Hines. 78 y.o. - admiited 04/23/22 - 04/25/23 for "AECOPD". BNP was high 700 at admit . NO followup BNP so far. [place]enin followup at [facility] 04/29/23 and was relatively hypotensiive. BP meds held. CHF clinic referral made. PAtient felt to be dry. Mitral regurg felt inoperable but TEE set up. Also felt  to be inoperable from IHD standpoint. HAd ECHO 04/20/23 and EF was 35-40% (similar to baseline).  He is here in pulmonary clinic.  He is feeling well.  No new complaints.  There is no dizziness.  He still has a few days of prednisone  taper left.  Wife is here with him.  He says he is compliant with his pirfenidone .  He does not apply sunscreen though but then the wife says he barely goes out.  There are no other new issues.  Recent labs reviewed.  Liver function test was normal.  Exercise hypoxemia test today is normal. .    SYMPTOM SCALE - ILD 02/18/2023 05/03/2023   Current weight    O2 use  Ra CPAP sleep apnea.   Shortness of Breath 0 -> 5 scale with 5 being worst (score 6 If unable to do)   At rest 0 3  Simple tasks - showers, clothes change, eating, shaving 3.5 2  Household (dishes, doing bed, laundry) 3.5 na  Shopping x na  Walking level at own pace 3 2  Walking up Stairs 3.5 3  Total (30-36) Dyspnea Score 13.5 10  How bad is your cough? 0 2  How bad is your fatigue Not good 3  How bad is nausea Not mch apperties   How bad is vomiting?  x   How bad is diarrhea? x   How bad is anxiety? xx   How bad is depression x   Any chronic pain - if so where and how bad x        Sit/stand test to mimic a stair climbing 12/16/2022  02/18/2023  05/03/2023   O2 used ra ra ra  Number laps completed Sit stand x 10 Sit stand x 15   Comments about pace Study but below average    Resting Pulse Ox/HR 95% % and 63/min 100% pulse ox with a heart rate 72 99% and HR 64  Final Pulse Ox/HR 93% % and 86/min 97% with a peak heart rate of 89. 91% and HR 95  Desaturated </= 88% No    Desaturated <= 3% points No    Got Tachycardic >/= 90/min Like to increase in heart rate but not greater than 90    Symptoms at end of test He was stably short of breath but denied shortness of breath is a symptom         PFT     Latest Ref Rng & Units 12/28/2022    2:23 PM  PFT Results  FVC-Pre L 2.49    FVC-Predicted Pre % 55   Pre FEV1/FVC % % 54   FEV1-Pre L 1.34   FEV1-Predicted Pre % 41   DLCO uncorrected ml/min/mmHg 14.92   DLCO UNC% % 56   DLCO corrected ml/min/mmHg 14.76   DLCO COR %Predicted % 56   DLVA Predicted % 103        LAB RESULTS last 96 hours No results found.       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), Alcoholic polyneuropathy (HCC), Allergy, Asthma, BPH (benign prostatic hyperplasia), 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 failure) (HCC) (04/23/2023), COPD (chronic obstructive pulmonary disease) (HCC), Depression, Depression, Diverticulosis, Dyspnea on exertion, Elevated LDL cholesterol level, GERD with stricture, Gout, Hiatal hernia, Hypertension, IPF (idiopathic pulmonary fibrosis) (HCC) (04/23/2023), Kidney stones, Lumbar and sacral osteoarthritis, Mitral regurgitation (04/23/2023), OSA on CPAP (04/23/2023), Peripheral polyneuropathy, Sleep apnea, and Wears hearing aid.   reports that he quit smoking about 54 years ago. His smoking use included cigarettes. He started smoking about 64 years ago. He has a 20 pack-year smoking history. He has never used smokeless tobacco.  Past Surgical History:  Procedure Laterality Date   COLONOSCOPY     INGUINAL HERNIA REPAIR     POLYPECTOMY     RIGHT/LEFT HEART CATH AND CORONARY ANGIOGRAPHY N/A 11/19/2022   Procedure: RIGHT/LEFT HEART CATH AND CORONARY ANGIOGRAPHY;  Surgeon: Odie Benne, MD;  Location: [facility];  Service: Cardiovascular;  Laterality: N/A;   TONSILLECTOMY AND ADENOIDECTOMY      No Known Allergies  Immunization History  Administered Date(s) Administered   Influenza Split 10/16/2016   Influenza, High Dose Seasonal PF  10/17/2017, 09/30/2018   Tdap 12/24/2016    Family History  Problem Relation Age of Onset   Transient ischemic attack Mother    Heart disease Father 80   Other Son        car wreck   Colon cancer Neg Hx     Esophageal cancer Neg Hx    Rectal cancer Neg Hx    Stomach cancer Neg Hx      Current Outpatient Medications:    albuterol  (PROVENTIL  HFA;VENTOLIN  HFA) 108 (90 Base) MCG/ACT inhaler, Inhale 1-2 puffs into the lungs every 6 (six) hours as needed for wheezing or shortness of breath., Disp: 1 Inhaler, Rfl: 0   albuterol  (PROVENTIL ) (2.5 MG/3ML) 0.083% nebulizer solution, Take 3 mLs (2.5 mg total) by nebulization every 6 (six) hours as needed for wheezing or shortness of breath., Disp: 75 mL, Rfl: 12   allopurinol  (ZYLOPRIM ) 300 MG tablet, Take 300 mg by mouth daily., Disp: , Rfl:    aspirin  81 MG tablet, Take 81 mg by mouth daily., Disp: , Rfl:    budesonide  (PULMICORT ) 0.5 MG/2ML nebulizer solution, Take 2 mLs (0.5 mg total) by nebulization 2 (two) times daily., Disp: 120 mL, Rfl: 0   buPROPion  (WELLBUTRIN  XL) 150 MG 24 hr tablet, Take 1 tablet by mouth every morning., Disp: , Rfl:    esomeprazole  (NEXIUM ) 40 MG capsule, TAKE ONE CAPSULE BY MOUTH DAILY IN THE MORNING (Patient taking differently: Take 40 mg by mouth daily.), Disp: 30 capsule, Rfl: 0   ezetimibe  (ZETIA ) 10 MG tablet, Take 1 tablet (10 mg total) by mouth daily., Disp: 90 tablet, Rfl: 3   fluticasone  (FLONASE ) 50 MCG/ACT nasal spray, Place 1 spray into both nostrils daily. (Patient taking differently: Place 1 spray into both nostrils daily as needed for allergies.), Disp: 16 g, Rfl: 2   furosemide  (LASIX ) 20 MG tablet, Take 1 tablet (20 mg total) by mouth daily., Disp: 90 tablet, Rfl: 1   losartan  (COZAAR ) 25 MG tablet, Take 0.5 tablets (12.5 mg total) by mouth daily., Disp: 45 tablet, Rfl: 3   metoprolol  succinate (TOPROL  XL) 25 MG 24 hr tablet, Take 0.5 tablets (12.5 mg total) by mouth daily., Disp: 45 tablet, Rfl: 1   Pirfenidone  267 MG TABS, Take 3 tablets (801 mg total) by mouth 3 (three) times daily with meals., Disp: 270 tablet, Rfl: 4   polyethylene glycol (MIRALAX  / GLYCOLAX ) 17 g packet, Take 17 g by mouth daily as needed  for mild constipation., Disp: 14 each, Rfl: 0   predniSONE  (DELTASONE ) 10 MG tablet, Prednisone  60 Mg p.o. once daily for 3 days, then 40 Mg p.o. once daily for 3 days, then 30 Mg p.o. once daily for 3 days, then 20 Mg p.o. once daily for 3 days, then 10 Mg p.o. once daily for 3 days and stop., Disp: 48 tablet, Rfl: 0   rosuvastatin  (CRESTOR ) 20 MG tablet, Take 20 mg by mouth daily., Disp: , Rfl:       Objective:   Vitals:   05/03/23 0821 05/03/23 0824  BP: 108/60   Pulse: 64   SpO2: 99%   Weight:  217 lb 3.2 oz (98.5 kg)  Height:  5\' 11"  (1.803 m)    Estimated body mass index is 30.29 kg/m as calculated from the following:   Height as of this encounter: 5\' 11"  (1.803 m).   Weight as of this encounter: 217 lb 3.2 oz (98.5 kg).  @WEIGHTCHANGE @  [employer]   05/03/23 0824  Weight:  217 lb 3.2 oz (98.5 kg)     Physical Exam   General: No distress. Looks well O2 at rest: no Cane present: no Sitting in wheel chair: no Frail: no Obese: no Neuro: Alert and Oriented x 3. GCS 15. Speech normal Psych: Pleasant Resp:  Barrel Chest - no.  Wheeze - no, Crackles - yes at right base esp, No overt respiratory distress CVS: Normal heart sounds. Murmurs - no Ext: Stigmata of Connective Tissue Disease - no HEENT: Normal upper airway. PEERL +. No post nasal drip        Assessment:       ICD-10-CM   1. IPF (idiopathic pulmonary fibrosis) (HCC)  Z61.096 Pulmonary function test    Hepatic function panel    2. Medication monitoring encounter  Z51.81 Pulmonary function test    Hepatic function panel    3. Hospital discharge follow-up  Z09 Pulmonary function test    Hepatic function panel         Plan:     Patient Instructions     ICD-10-CM   1. IPF (idiopathic pulmonary fibrosis) (HCC)  E45.409 Pulmonary function test    2. Medication monitoring encounter  Z51.81 Pulmonary function test    3. Hospital discharge follow-up  Z09 Pulmonary function test        #IPF Giving you 02/18/2023 diagnosis of  idiopathic pulmonary fibrosis [IPF].  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  Currently stable despite recent hospitalization LFT normal   Plan -  pirfenidone  per protocol - continue  - monthly LFT til next visit  - spiro/dlco in 3 months  #Preoperative respiratory exam  The fact you are able to sit and stand 1rtimes without dropping oxygen is somewhat reassuring in terms of being able to have a bypass surgery but overall risk for prolonged ventilator dependence is around moderate high.  This based on the fact of pulmonary fibrosis and also your age and the type of surgery.  Patients can have pulmonary fibrosis flareup  April 2025 - [facility] deemed you high risk for cardiac surgery   Plan - per cards   #Noisy breathing even at rest -Abnormal flow-volume pattern on the PFT  Unclear cause and PFT shows obstruction when he has obvious and there is no emphysema described   Plan - Talk about this with the sleep doctor -See r ENT - keep appt with Dr Soldotava 05/20/23 -Based on above can consider inhaler therapy.   # Goals of care  -I think overall life expectancy is shortened because of cardiac disease with low ejection fraction and pulmonary fibrosis.  Currently the cardiac disease is a bigger issue.  In the presence of antifibrotic's patients can potentially live over 5 years to 10 years compared to the natural progression of this disease.  Therefore if surgery is needed for the heart it is probably worth taking the risk and having a bypass surgery   Follow-up - Return within 12 weeks to discuss progress and also initiation of antifibrotic's.  -Okay to see nurse practitioner  - 15 min visit   FOLLOWUP Return for 15 min visit,  after Spiro and DLCO, with Dr Bertrum Brodie, with any of the APPS, Face to Face Visit.    SIGNATURE    Dr. Maire Scot, M.D., F.C.C.P,  Pulmonary and Critical Care Medicine Staff Physician, [facility] Director - Interstitial Lung Disease  Program  Pulmonary Fibrosis [facility] Network at [facility] [place], [place], [address]  Pager: [phone], If no answer or between  15:00h - 7:00h: call [phone] Telephone: [phone]  9:00 AM 05/03/2023

## 2023-05-03 ENCOUNTER — Encounter: Payer: Self-pay | Admitting: Internal Medicine

## 2023-05-03 ENCOUNTER — Ambulatory Visit: Admitting: Internal Medicine

## 2023-05-03 VITALS — BP 108/60 | HR 64 | Ht 71.0 in | Wt 217.2 lb

## 2023-05-03 DIAGNOSIS — Z5181 Encounter for therapeutic drug level monitoring: Secondary | ICD-10-CM | POA: Diagnosis not present

## 2023-05-03 DIAGNOSIS — Z09 Encounter for follow-up examination after completed treatment for conditions other than malignant neoplasm: Secondary | ICD-10-CM

## 2023-05-03 DIAGNOSIS — J84112 Idiopathic pulmonary fibrosis: Secondary | ICD-10-CM

## 2023-05-06 ENCOUNTER — Other Ambulatory Visit: Payer: Self-pay

## 2023-05-07 ENCOUNTER — Ambulatory Visit

## 2023-05-14 DIAGNOSIS — G4733 Obstructive sleep apnea (adult) (pediatric): Secondary | ICD-10-CM | POA: Diagnosis not present

## 2023-05-15 ENCOUNTER — Encounter: Payer: Self-pay | Admitting: Internal Medicine

## 2023-05-17 NOTE — Progress Notes (Signed)
 Called patient for pre-procedure instructions. Detailed voicemail left by this RN

## 2023-05-18 ENCOUNTER — Encounter (HOSPITAL_COMMUNITY)
Admission: RE | Disposition: A | Discharge status: Home / Self Care | Payer: Self-pay | Source: Home / Self Care | Attending: Cardiovascular Disease

## 2023-05-18 ENCOUNTER — Ambulatory Visit (HOSPITAL_COMMUNITY)
Admission: RE | Admit: 2023-05-18 | Discharge: 2023-05-18 | Disposition: A | Discharge status: Home / Self Care | Attending: Cardiovascular Disease | Admitting: Cardiovascular Disease

## 2023-05-18 ENCOUNTER — Encounter (HOSPITAL_COMMUNITY): Payer: Self-pay | Admitting: Cardiovascular Disease

## 2023-05-18 ENCOUNTER — Ambulatory Visit (HOSPITAL_COMMUNITY)

## 2023-05-18 ENCOUNTER — Other Ambulatory Visit: Payer: Self-pay

## 2023-05-18 ENCOUNTER — Ambulatory Visit (HOSPITAL_COMMUNITY)
Admission: RE | Admit: 2023-05-18 | Discharge: 2023-05-18 | Disposition: A | Discharge status: Home / Self Care | Source: Ambulatory Visit | Attending: Cardiovascular Disease | Admitting: Cardiovascular Disease

## 2023-05-18 DIAGNOSIS — I25119 Atherosclerotic heart disease of native coronary artery with unspecified angina pectoris: Secondary | ICD-10-CM | POA: Insufficient documentation

## 2023-05-18 DIAGNOSIS — I110 Hypertensive heart disease with heart failure: Secondary | ICD-10-CM | POA: Insufficient documentation

## 2023-05-18 DIAGNOSIS — J4489 Other specified chronic obstructive pulmonary disease: Secondary | ICD-10-CM | POA: Diagnosis not present

## 2023-05-18 DIAGNOSIS — I361 Nonrheumatic tricuspid (valve) insufficiency: Secondary | ICD-10-CM

## 2023-05-18 DIAGNOSIS — I351 Nonrheumatic aortic (valve) insufficiency: Secondary | ICD-10-CM

## 2023-05-18 DIAGNOSIS — Z79899 Other long term (current) drug therapy: Secondary | ICD-10-CM | POA: Diagnosis not present

## 2023-05-18 DIAGNOSIS — I700 Atherosclerosis of aorta: Secondary | ICD-10-CM | POA: Insufficient documentation

## 2023-05-18 DIAGNOSIS — I5042 Chronic combined systolic (congestive) and diastolic (congestive) heart failure: Secondary | ICD-10-CM

## 2023-05-18 DIAGNOSIS — J84112 Idiopathic pulmonary fibrosis: Secondary | ICD-10-CM | POA: Diagnosis not present

## 2023-05-18 DIAGNOSIS — I371 Nonrheumatic pulmonary valve insufficiency: Secondary | ICD-10-CM | POA: Diagnosis not present

## 2023-05-18 DIAGNOSIS — I255 Ischemic cardiomyopathy: Secondary | ICD-10-CM | POA: Insufficient documentation

## 2023-05-18 DIAGNOSIS — E785 Hyperlipidemia, unspecified: Secondary | ICD-10-CM | POA: Insufficient documentation

## 2023-05-18 DIAGNOSIS — I340 Nonrheumatic mitral (valve) insufficiency: Secondary | ICD-10-CM

## 2023-05-18 DIAGNOSIS — Z7982 Long term (current) use of aspirin: Secondary | ICD-10-CM | POA: Diagnosis not present

## 2023-05-18 DIAGNOSIS — Z87891 Personal history of nicotine dependence: Secondary | ICD-10-CM | POA: Diagnosis not present

## 2023-05-18 DIAGNOSIS — I080 Rheumatic disorders of both mitral and aortic valves: Secondary | ICD-10-CM | POA: Insufficient documentation

## 2023-05-18 DIAGNOSIS — I2582 Chronic total occlusion of coronary artery: Secondary | ICD-10-CM | POA: Insufficient documentation

## 2023-05-18 HISTORY — PX: TRANSESOPHAGEAL ECHOCARDIOGRAM (CATH LAB): EP1270

## 2023-05-18 LAB — ECHO TEE
MV M vel: 3.68 m/s
MV Peak grad: 54.2 mmHg
Radius: 0.83 cm
S' Lateral: 5.31 cm

## 2023-05-18 SURGERY — TRANSESOPHAGEAL ECHOCARDIOGRAM (TEE) (CATHLAB)
Anesthesia: Monitor Anesthesia Care

## 2023-05-18 MED ORDER — PROPOFOL 10 MG/ML IV BOLUS
INTRAVENOUS | Status: DC | PRN
  Administered 2023-05-18: 25 mg via INTRAVENOUS

## 2023-05-18 MED ORDER — PHENYLEPHRINE 80 MCG/ML (10ML) SYRINGE FOR IV PUSH (FOR BLOOD PRESSURE SUPPORT)
PREFILLED_SYRINGE | INTRAVENOUS | Status: DC | PRN
  Administered 2023-05-18: 160 ug via INTRAVENOUS

## 2023-05-18 MED ORDER — EPHEDRINE SULFATE-NACL 50-0.9 MG/10ML-% IV SOSY
PREFILLED_SYRINGE | INTRAVENOUS | Status: DC | PRN
  Administered 2023-05-18: 10 mg via INTRAVENOUS

## 2023-05-18 MED ORDER — PROPOFOL 500 MG/50ML IV EMUL
INTRAVENOUS | Status: DC | PRN
  Administered 2023-05-18: 100 ug/kg/min via INTRAVENOUS

## 2023-05-18 MED ORDER — PHENYLEPHRINE HCL-NACL 20-0.9 MG/250ML-% IV SOLN
INTRAVENOUS | Status: DC | PRN
  Administered 2023-05-18: 60 ug/min via INTRAVENOUS

## 2023-05-18 MED ORDER — SODIUM CHLORIDE 0.9 % IV SOLN: INTRAVENOUS | Status: DC

## 2023-05-18 NOTE — Telephone Encounter (Signed)
Patient is having procedure today.

## 2023-05-18 NOTE — Interval H&P Note (Signed)
 History and Physical Interval Note:  05/18/2023 7:45 AM  Jose Hines.  has presented today for surgery, with the diagnosis of MITRAL VALVE REGURGIGATION.  The various methods of treatment have been discussed with the patient and family. After consideration of risks, benefits and other options for treatment, the patient has consented to  Procedure(s): TRANSESOPHAGEAL ECHOCARDIOGRAM (N/A) as a surgical intervention.  The patient's history has been reviewed, patient examined, no change in status, stable for surgery.  I have reviewed the patient's chart and labs.  Questions were answered to the patient's satisfaction.     [name]

## 2023-05-18 NOTE — Transfer of Care (Signed)
 Immediate Anesthesia Transfer of Care Note  Patient: Jose O Hinners Jr.  Procedure(s) Performed: TRANSESOPHAGEAL ECHOCARDIOGRAM  Patient Location: PACU  Anesthesia Type:MAC  Level of Consciousness: drowsy  Airway & Oxygen Therapy: Patient Spontanous Breathing and Patient connected to face mask oxygen  Post-op Assessment: Report given to RN and Post -op Vital signs reviewed and stable  Post vital signs: Reviewed and stable  Last Vitals:  Vitals Value Taken Time  BP    Temp    Pulse    Resp    SpO2      Last Pain:  Vitals:   05/18/23 0802  TempSrc:   PainSc: 0-No pain         Complications: No notable events documented.

## 2023-05-18 NOTE — Anesthesia Preprocedure Evaluation (Signed)
 Anesthesia Evaluation  Patient identified by MRN, date of birth, ID band Patient awake    Reviewed: Allergy & Precautions, H&P , NPO status , Patient's Chart, lab work & pertinent test results  Airway Mallampati: II  TM Distance: >3 FB Neck ROM: Full    Dental no notable dental hx.    Pulmonary asthma , sleep apnea , COPD, former smoker Pulmonary fibrosis   Pulmonary exam normal breath sounds clear to auscultation       Cardiovascular hypertension, +CHF  Normal cardiovascular exam Rhythm:Regular Rate:Normal  Mod / Severe MR  TTE 04/2023:  IMPRESSIONS     1. Left ventricular ejection fraction, by estimation, is 35 to 40%. Left  ventricular ejection fraction by 3D volume is 36 %. The left ventricle has  moderately decreased function. The left ventricle demonstrates global  hypokinesis. The left ventricular  internal cavity size was severely dilated. Left ventricular diastolic  parameters are consistent with Grade II diastolic dysfunction  (pseudonormalization). The average left ventricular global longitudinal  strain is -18.2 %.   2. Right ventricular systolic function is normal. The right ventricular  size is normal. There is normal pulmonary artery systolic pressure.   3. Left atrial size was moderately dilated.   4. The mitral valve is degenerative. There is mild calcification of the  mitral valve leaflet(s). Mild mitral annular calcification. Moderate to  moderately severe mitral valve regurgitation. No evidence of mitral  stenosis   5. The aortic valve is tricuspid. Aortic valve regurgitation is mild.  Aortic valve sclerosis/calcification is present, without any evidence of  aortic stenosis.   6. The inferior vena cava is normal in size with greater than 50%  respiratory variability, suggesting right atrial pressure of 3 mmHg.   7. Visually MR appears at least moderate but possibly moderately severe.  PISA MR ERO  0.27cm2. MR RF 34cc.   8. Compared to echo dated 10/29/2022, Mitral regurgitation has  significantly worsened. Consider TEE.     Neuro/Psych  PSYCHIATRIC DISORDERS  Depression    negative neurological ROS     GI/Hepatic Neg liver ROS, hiatal hernia,GERD  ,,  Endo/Other  negative endocrine ROS    Renal/GU Renal disease  negative genitourinary   Musculoskeletal negative musculoskeletal ROS (+)    Abdominal   Peds negative pediatric ROS (+)  Hematology negative hematology ROS (+)   Anesthesia Other Findings   Reproductive/Obstetrics negative OB ROS                              Anesthesia Physical Anesthesia Plan  ASA: 3  Anesthesia Plan: MAC   Post-op Pain Management:    Induction: Intravenous  PONV Risk Score and Plan: 1 and Propofol infusion and Treatment may vary due to age or medical condition  Airway Management Planned: Natural Airway  Additional Equipment:   Intra-op Plan:   Post-operative Plan:   Informed Consent: I have reviewed the patients History and Physical, chart, labs and discussed the procedure including the risks, benefits and alternatives for the proposed anesthesia with the patient or authorized representative who has indicated his/her understanding and acceptance.     Dental advisory given  Plan Discussed with: CRNA  Anesthesia Plan Comments:          Anesthesia Quick Evaluation

## 2023-05-18 NOTE — Anesthesia Postprocedure Evaluation (Signed)
 Anesthesia Post Note  Patient: Jose Hines.  Procedure(s) Performed: TRANSESOPHAGEAL ECHOCARDIOGRAM     Patient location during evaluation: PACU Anesthesia Type: MAC Level of consciousness: awake and alert Pain management: pain level controlled Vital Signs Assessment: post-procedure vital signs reviewed and stable Respiratory status: spontaneous breathing, nonlabored ventilation, respiratory function stable and patient connected to nasal cannula oxygen Cardiovascular status: stable and blood pressure returned to baseline Postop Assessment: no apparent nausea or vomiting Anesthetic complications: no   No notable events documented.  Last Vitals:  Vitals:   05/18/23 0920 05/18/23 0930  BP: 107/67   Pulse: 86 76  Resp: (!) 25 18  Temp: 36.6 C   SpO2: 93% 92%    Last Pain:  Vitals:   05/18/23 0920  TempSrc: Temporal  PainSc: 0-No pain                 Lethaniel Rave

## 2023-05-18 NOTE — Discharge Instructions (Signed)

## 2023-05-18 NOTE — Op Note (Signed)
 INDICATIONS: Mitral insufficiency  PROCEDURE:   Informed consent was obtained prior to the procedure. The risks, benefits and alternatives for the procedure were discussed and the patient comprehended these risks.  Risks include, but are not limited to, cough, sore throat, vomiting, nausea, somnolence, esophageal and stomach trauma or perforation, bleeding, low blood pressure, aspiration, pneumonia, infection, trauma to the teeth and death.    After a procedural time-out, the oropharynx was anesthetized with 20% benzocaine spray.   During this procedure the patient was administered IV propofol by Anesthesiology, Dr. Treen.  The transesophageal probe was inserted in the esophagus and stomach without difficulty and multiple views were obtained.  The patient was kept under observation until the patient left the procedure room.  The patient left the procedure room in stable condition.   Agitated microbubble saline contrast was not administered.  COMPLICATIONS:    There were no immediate complications.  FINDINGS:  Severe ischemic cardiomyopathy, EF 20% with multiple wall motion abnormalities. Moderate-severe mitral insufficiency due to leaflet malcoaptation, central jet along a long segment of the coaptation line, primarily at A2-P2 (middle), but also towards A3-P3 (medial) segments. RV is also depressed. Sufficient length of posterior leaflet and large MV area (>5 cm sq) for TEER. Severely dilated left atrium, no left atrial thrombus. No difficulty was encountered passing the TEE probe into the stomach, despite the history of prior esophageal stenosis and dilation procedures.  RECOMMENDATIONS:     Proceed with TEER workup. Discuss in structural heart meeting.  Time Spent Directly with the Patient:  30 minutes   Leander Tout 05/18/2023, 8:56 AM

## 2023-05-20 ENCOUNTER — Telehealth: Payer: Self-pay

## 2023-05-20 ENCOUNTER — Institutional Professional Consult (permissible substitution) (INDEPENDENT_AMBULATORY_CARE_PROVIDER_SITE_OTHER): Admitting: Otolaryngology

## 2023-05-20 NOTE — Telephone Encounter (Signed)
 Jose Hines

## 2023-05-20 NOTE — Telephone Encounter (Signed)
 Ottavio Strohmeyer:  Fossa looks clear for transseptal approach. Adequate space within the left atrium to achieve straddle and manipulate device down to the mitral valve. Bilateral leaflet tethering of the posterior/antieror leaflets can be seen in multiple views, causing MR across the entirety of the valve. MR appears on the medial side of A2P2 and extends across to the lateral side, into the A1P1 region. Posterior leaflet length measured in multiple windows >1.0cm, enough for our longer clip options. Gradient of seen with a larger valve area of 6.57cm2 measured in the SAX transgastric. With the extent of the MR, alongside the regurgitant volume seen at 61ml, I expect a multiple clip case should the patient's valve be able to tolerate multiple implants. Plan on placing first clip on the medial side of A2P2, with a plan on placing the second clip just lateral to the first depending on how the MR responds. XTW appropriate.

## 2023-06-01 ENCOUNTER — Other Ambulatory Visit (HOSPITAL_COMMUNITY): Payer: Self-pay

## 2023-06-03 ENCOUNTER — Other Ambulatory Visit (HOSPITAL_COMMUNITY): Payer: Self-pay

## 2023-06-03 ENCOUNTER — Other Ambulatory Visit: Payer: Self-pay

## 2023-06-03 NOTE — Discharge Summary (Addendum)
 Physician Discharge Summary  Patient ID: Jose O Waner Jr. MRN: [id number] DOB/AGE: [birth year] 78 y.o.  Admit date: 04/23/2023 Discharge date: 04/25/2023  Admission Diagnoses:  Discharge Diagnoses:  Principal Problem:   COPD with acute exacerbation [facility]) Active Problems:   Essential hypertension   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 failure) (HCC)   Mitral regurgitation   OSA on CPAP   IPF (idiopathic pulmonary fibrosis) (HCC)   Acute respiratory failure with hypoxia (HCC)   Discharged Condition: stable  Hospital Course:  Patient is a 78 year old male with past medical history significant for COPD, idiopathic pulmonary fibrosis, OSA on CPAP, CAD, mitral regurgitation, and chronic combined systolic and diastolic CHF.  Patient was admitted with worsening shortness of breath and cough.  Patient was admitted and managed for COPD exacerbation with IV Solu-Medrol , antibiotics and nebulizer treatment.  Patient patient has been optimized, will be discharged to the care of the primary care provider.    1. COPD exacerbation; acute hypoxic respiratory failure; IPF; OSA  -Treated with IV Solu-Medrol , nebulizer treatment, IV antibiotics and supportive care.        2. Chronic combined systolic & diastolic CHF; mitral regurgitation  - Appears compensated  - Continued Lasix , Toprol , and Entresto      3. CAD  - No anginal complaints  - Continued ASA 81, Crestor , Toprol    Consults: None  Significant Diagnostic Studies:  Chest x-ray revealed: Hyperinflation. Linear opacity the bases likely scar or atelectasis.   Possible dilated small bowel in the upper abdomen at the edge of the imaging field. Recommend further workup of the abdomen when appropriate with x-ray or CT.     Electronically Signed   By: Adrianna Horde M.D.   On: 04/23/2023 18:18   Abdominal x-ray revealed: Nonobstructed gas pattern with mild stool in the colon.     Electronically Signed   By:  Esmeralda Hedge M.D.   On: 04/23/2023 23:22   Discharge Exam: Blood pressure 112/69, pulse 86, temperature 97.9 F (36.6 C), temperature source Oral, resp. rate 20, height 5\' 11"  (1.803 m), weight 97.2 kg, SpO2 94%.   Disposition: Discharge disposition: 01-Home or Self Care       Discharge Instructions     Diet - low sodium heart healthy   Complete by: As directed    Increase activity slowly   Complete by: As directed       Allergies as of 04/25/2023   No Known Allergies      Medication List     STOP taking these medications    Glucosamine HCl 1500 MG Tabs   magnesium  oxide 400 (240 Mg) MG tablet Commonly known as: MAG-OX   multivitamins with iron Tabs tablet       TAKE these medications    albuterol  108 (90 Base) MCG/ACT inhaler Commonly known as: VENTOLIN  HFA Inhale 1-2 puffs into the lungs every 6 (six) hours as needed for wheezing or shortness of breath.   albuterol  (2.5 MG/3ML) 0.083% nebulizer solution Commonly known as: PROVENTIL  Take 3 mLs (2.5 mg total) by nebulization every 6 (six) hours as needed for wheezing or shortness of breath.   allopurinol  300 MG tablet Commonly known as: ZYLOPRIM  Take 300 mg by mouth daily.   aspirin  81 MG tablet Take 81 mg by mouth daily.   buPROPion  150 MG 24 hr tablet Commonly known as: WELLBUTRIN  XL Take 150 mg by mouth every morning.   esomeprazole  40 MG capsule Commonly known as: NEXIUM  TAKE ONE CAPSULE BY  MOUTH DAILY IN THE MORNING What changed: See the new instructions.   ezetimibe  10 MG tablet Commonly known as: ZETIA  Take 1 tablet (10 mg total) by mouth daily.   fluticasone  50 MCG/ACT nasal spray Commonly known as: FLONASE  Place 1 spray into both nostrils daily. What changed:  when to take this reasons to take this   Pirfenidone  267 MG Tabs Take 3 tablets (801 mg total) by mouth 3 (three) times daily with meals.   polyethylene glycol 17 g packet Commonly known as: MIRALAX  / GLYCOLAX  Take  17 g by mouth daily as needed for mild constipation.   predniSONE  10 MG tablet Commonly known as: DELTASONE  Prednisone  60 Mg p.o. once daily for 3 days, then 40 Mg p.o. once daily for 3 days, then 30 Mg p.o. once daily for 3 days, then 20 Mg p.o. once daily for 3 days, then 10 Mg p.o. once daily for 3 days and stop.   rosuvastatin  20 MG tablet Commonly known as: CRESTOR  Take 20 mg by mouth daily.        Time spent: 35 minutes.  SignedDoroteo Gasmen 06/03/2023, 5:15 PM

## 2023-06-03 NOTE — Progress Notes (Signed)
 Specialty Pharmacy Refill Coordination Note  MyChart Questionnaire Submission   Jose O Crook Jr. is a 78 y.o. male contacted today regarding refills of specialty medication(s) Pirfenidone .  Patient requested (Patient-Rptd) Delivery   Delivery date: (Patient-Rptd) 06/11/23   [place]ified [place] ([place], [place] [address]   Medication will be filled on 06/10/23.

## 2023-06-09 DIAGNOSIS — G4733 Obstructive sleep apnea (adult) (pediatric): Secondary | ICD-10-CM | POA: Diagnosis not present

## 2023-06-10 ENCOUNTER — Other Ambulatory Visit: Payer: Self-pay

## 2023-06-11 ENCOUNTER — Telehealth (HOSPITAL_COMMUNITY): Payer: Self-pay | Admitting: Cardiology

## 2023-06-11 NOTE — Telephone Encounter (Signed)
 Called to confirm/remind patient of their appointment at the [facility] on 06/11/2023.   Appointment:   [x] Confirmed  [] Left mess   [] No answer/No voice mail  [] VM Full/unable to leave message  [] Phone not in service  Patient reminded to bring all medications and/or complete list.  Confirmed patient has transportation. Gave directions, instructed to utilize valet parking.

## 2023-06-13 NOTE — Progress Notes (Incomplete)
 [facility] NOTE  Referring Physician: Rae Bugler, MD  Primary Care: Rae Bugler, MD Primary Cardiologist:  CC: Severe MR  HPI: Jose Hines. is a 78 y.o. male with heart failure with reduced ejection fraction secondary to ischemic cardiomyopathy, CAD (CTO of dominant RCA, high-grade proximal LAD disease), severe mitral regurgitation, idiopathic pulmonary fibrosis and COPD presenting today to establish care.  His cardiac history dates back to October 2024 when he saw Dr. Berry Bristol for dyspnea.  Patient was referred to pulmonology and diagnosed with idiopathic pulmonary fibrosis along with COPD.  Echocardiogram with EF of 30 to 35% at that time.  Left heart cath with occluded right coronary artery and echocardiogram with moderate to severe mitral regurgitation.  He was started on GDMT and seen by structural.  During his appointment with structural, patient notably hypotensive with reduction in medication doses.  Patient underwent TEE on 05/18/2023 that was significant for EF of 20%, moderate to severe mitral regurgitation and reduced RV function.  Today he presents for further evaluation.  According to [name] he has had dyspnea since childhood. He would become quickly short of breath with activity (never required inhalers). Over the past 1 year he has had a rapid decline in functional status. According to his wife, he has conversational dyspnea at times and becomes quickly short of breath with walking > 30ft. He is unable to walk from the entrance of Heart & vascular to our clinic without stopping for breaks. Overall he has at least NYHA III functional status.    PHYSICAL EXAM: Vitals:   06/14/23 1103  BP: 110/64  Pulse: 70  SpO2: 93%   GENERAL: NAD Lungs- fine inspiratory crackles at bases CARDIAC:  JVP: 5 cm          Normal rate with regular rhythm. 3/6 systolic murmur.  Pulses 2+. no edema.  ABDOMEN: Soft, non-tender, non-distended.  EXTREMITIES: Warm and  well perfused.  NEUROLOGIC: No obvious FND   DATA REVIEW  ECG: 06/14/23: NSR  As per my personal interpretation  ECHO: 05/18/2023: TEE, LVEF 20%, moderate to severe mitral regurgitation, reduced RV function.  As per my personal interpretation  CATH: 11/24/   Prox RCA to Mid RCA lesion is 100% stenosed.   Prox LAD lesion is 80% stenosed.   Ost Cx to Prox Cx lesion is 20% stenosed.   Prox LAD to Mid LAD lesion is 90% stenosed.   2nd Diag lesion is 50% stenosed.   Mid LAD lesion is 80% stenosed. CI 2.8 L/min/m2, PA mean: 24, PCWP 18-20 with small V waves  : 06/14/23:  Pt ambulated 346ft (91.44 m) O2 Sat ranged 96%-92% room air HR ranged 74-103 Patient only walked for 3:43 minutes ended secondary to fatigue    ASSESSMENT & PLAN:  Heart failure with reduced ejection fraction Etiology of HF: Ischemic cardiomyopathy NYHA class / AHA Stage:NYHA III Volume status & Diuretics: Lasix  20 mg daily; euvolemic REDS 31%.  Vasodilators: Losartan  12.5 mg daily; increase to 25mg  daily.  Beta-Blocker: Toprol  12.5 mg daily IHK:[id number] at follow up Cardiometabolic:start at follow up Devices therapies & Valvulopathies: severe MR; will discuss case with Dr. Lorie Rook & Dr. Berry Bristol Advanced therapies:not indicated.   2. Severe mitral regurgitation - Patient in a tough situation.  His mitral regurgitation is moderate to severe and not out of proportion to his LV size.  Right heart cath from 2024 with small V waves and a PA mean of 24 with a cardiac index of 2.8  L/min/m by Fick.  Functionally he feels very limited by dyspnea..  - Will reach out to Dr. Lorie Rook to discuss repeat right heart catheterization prior to TEER. I am not sure how much he will benefit from TEER at this point. I discussed this extensively with Mr. Clapper today.  - Very poor results on today: Ambulated 62m (consistent with poor prognosis). He was only able to ambulate 20m43s with O2 sats ranging from 92%-96%.   3.  Coronary  artery disease - CTO of the RCA - Continue aspirin  81 mg daily. - Continue Crestor  20 mg daily  4.  Idiopathic pulmonary fibrosis - Followed by pulmonology, currently on pirfenidone . - May benefit from repeat right heart cath to evaluate for pulmonary hypertension. Although, PA mean was less than 25 on prior RHC.   Will discuss with Dr. Bertrum Brodie - Very poor results on today: Ambulated 55m (consistent with poor prognosis). He was only able to ambulate 53m43s with O2 sats ranging from 92%-96%. .  I spent 50 minutes caring for this patient today including face to face time, ordering and reviewing labs, reviewing records noted above, discussing mTEER and systolic heart failure extensively, seeing the patient, documenting in the record, and arranging follow ups.   Neelah Mannings Advanced [facility] Mechanical Circulatory Support

## 2023-06-14 ENCOUNTER — Encounter (HOSPITAL_COMMUNITY): Payer: Self-pay | Admitting: Cardiology

## 2023-06-14 ENCOUNTER — Ambulatory Visit (HOSPITAL_COMMUNITY)
Admission: RE | Admit: 2023-06-14 | Discharge: 2023-06-14 | Disposition: A | Discharge status: Home / Self Care | Source: Ambulatory Visit | Attending: Cardiology | Admitting: Cardiology

## 2023-06-14 VITALS — BP 110/64 | HR 70 | Wt 216.2 lb

## 2023-06-14 DIAGNOSIS — R262 Difficulty in walking, not elsewhere classified: Secondary | ICD-10-CM | POA: Insufficient documentation

## 2023-06-14 DIAGNOSIS — J84112 Idiopathic pulmonary fibrosis: Secondary | ICD-10-CM

## 2023-06-14 DIAGNOSIS — G4733 Obstructive sleep apnea (adult) (pediatric): Secondary | ICD-10-CM | POA: Diagnosis not present

## 2023-06-14 DIAGNOSIS — I5020 Unspecified systolic (congestive) heart failure: Secondary | ICD-10-CM

## 2023-06-14 DIAGNOSIS — I340 Nonrheumatic mitral (valve) insufficiency: Secondary | ICD-10-CM | POA: Diagnosis not present

## 2023-06-14 DIAGNOSIS — Z7982 Long term (current) use of aspirin: Secondary | ICD-10-CM | POA: Insufficient documentation

## 2023-06-14 DIAGNOSIS — I5022 Chronic systolic (congestive) heart failure: Secondary | ICD-10-CM | POA: Diagnosis not present

## 2023-06-14 DIAGNOSIS — I25118 Atherosclerotic heart disease of native coronary artery with other forms of angina pectoris: Secondary | ICD-10-CM

## 2023-06-14 DIAGNOSIS — J449 Chronic obstructive pulmonary disease, unspecified: Secondary | ICD-10-CM | POA: Insufficient documentation

## 2023-06-14 DIAGNOSIS — I255 Ischemic cardiomyopathy: Secondary | ICD-10-CM | POA: Insufficient documentation

## 2023-06-14 DIAGNOSIS — Z79899 Other long term (current) drug therapy: Secondary | ICD-10-CM | POA: Insufficient documentation

## 2023-06-14 DIAGNOSIS — I2584 Coronary atherosclerosis due to calcified coronary lesion: Secondary | ICD-10-CM

## 2023-06-14 DIAGNOSIS — I2510 Atherosclerotic heart disease of native coronary artery without angina pectoris: Secondary | ICD-10-CM | POA: Insufficient documentation

## 2023-06-14 LAB — BASIC METABOLIC PANEL WITH GFR
Anion gap: 6 (ref 5–15)
BUN: 9 mg/dL (ref 8–23)
CO2: 30 mmol/L (ref 22–32)
Calcium: 8.5 mg/dL — ABNORMAL LOW (ref 8.9–10.3)
Chloride: 102 mmol/L (ref 98–111)
Creatinine, Ser: 0.78 mg/dL (ref 0.61–1.24)
GFR, Estimated: >60 mL/min (ref >60)
Glucose, Bld: 130 mg/dL — ABNORMAL HIGH (ref 70–99)
Potassium: 3.3 mmol/L — ABNORMAL LOW (ref 3.5–5.1)
Sodium: 138 mmol/L (ref 135–145)

## 2023-06-14 LAB — BRAIN NATRIURETIC PEPTIDE: B Natriuretic Peptide: 657.2 pg/mL — ABNORMAL HIGH (ref 0.0–100.0)

## 2023-06-14 MED ORDER — LOSARTAN POTASSIUM 25 MG PO TABS: 25.0000 mg | ORAL_TABLET | Freq: Every day | ORAL | 5 refills | Status: DC

## 2023-06-14 NOTE — Progress Notes (Signed)
 ReDS Vest / Clip - 06/14/23 1103       ReDS Vest / Clip   Station Marker D    Ruler Value 35    ReDS Value Range Low volume    ReDS Actual Value 31

## 2023-06-14 NOTE — Patient Instructions (Addendum)
 Medication Changes:  INCREASE LOSARTAN  TO 25MG  ONCE DAILY   Lab Work:  Labs done today, your results will be available in MyChart, we will contact you for abnormal readings.  Follow-Up in: 3 MONTHS PLEASE CALL OUR OFFICE AROUND JULY TO GET SCHEDULED FOR YOUR APPOINTMENT. PHONE NUMBER IS [phone] OPTION 2   At the [facility], you and your health needs are our priority. We have a designated team specialized in the treatment of Heart Failure. This Care Team includes your primary Heart Failure Specialized Cardiologist (physician), Advanced Practice Providers (APPs- Physician Assistants and Nurse Practitioners), and Pharmacist who all work together to provide you with the care you need, when you need it.   You may see any of the following providers on your designated Care Team at your next follow up:  Dr. Jules Oar Dr. Peder Bourdon Dr. Alwin Baars Dr. Judyth Nunnery Nieves Bars, NP Ruddy Corral, [place] [facility] [place], [place] Dennise Fitz, NP [place] Lee, NP Luster Salters, PharmD   Please be sure to bring in all your medications bottles to every appointment.   Need to Contact Us :  If you have any questions or concerns before your next appointment please send us  a message through [place] or call our office at [phone].    TO LEAVE A MESSAGE FOR THE NURSE SELECT OPTION 2, PLEASE LEAVE A MESSAGE INCLUDING: YOUR NAME DATE OF BIRTH CALL BACK NUMBER REASON FOR CALL**this is important as we prioritize the call backs  YOU WILL RECEIVE A CALL BACK THE SAME DAY AS LONG AS YOU CALL BEFORE 4:00 PM

## 2023-06-14 NOTE — Progress Notes (Signed)
 6 Min Walk Test Completed  Pt ambulated 398ft (91.44 m) O2 Sat ranged 96%-92% room air HR ranged 74-103 Patient only walked for 3:43 minutes ended secondary to fatigue

## 2023-06-15 ENCOUNTER — Encounter: Payer: Self-pay | Admitting: Cardiology

## 2023-06-15 NOTE — Telephone Encounter (Signed)
 Please review and advise.

## 2023-06-17 ENCOUNTER — Telehealth: Payer: Self-pay | Admitting: Internal Medicine

## 2023-06-17 DIAGNOSIS — J84112 Idiopathic pulmonary fibrosis: Secondary | ICD-10-CM

## 2023-06-17 NOTE — Telephone Encounter (Signed)
 Cardiology considering LAD Stent and fixing mitral valve. So need urgent PFT next week - ok to do in hospital - ordered.Let patinet know

## 2023-06-18 ENCOUNTER — Telehealth: Payer: Self-pay | Admitting: Cardiology

## 2023-06-18 NOTE — Telephone Encounter (Signed)
 Hospital tried to reach out to patient but have been unsuccessful as well. They have his PFT scheduled for this Monday June 16th.

## 2023-06-18 NOTE — Telephone Encounter (Signed)
 Per [facility]- hospital PFT now scheduled by front staff  Routing to admin pool- PFT was ordered

## 2023-06-18 NOTE — Telephone Encounter (Signed)
 Left another message for the patient and also sent a MyChart message.

## 2023-06-18 NOTE — Telephone Encounter (Signed)
 Left a message for patient to call and get scheduled for the [facility] office as they have PFTs available for next week. Also left a message for [facility].  Not sure if they have openings for next week at this moment.

## 2023-06-20 NOTE — Telephone Encounter (Signed)
 I have had extensive discussion with the patient's wife and also with treatment team as well, I would like to see the patient prior to proceeding with PCI to the LAD.

## 2023-06-21 ENCOUNTER — Ambulatory Visit (HOSPITAL_COMMUNITY)
Admission: RE | Admit: 2023-06-21 | Discharge: 2023-06-21 | Disposition: A | Discharge status: Home / Self Care | Source: Ambulatory Visit | Attending: Internal Medicine | Admitting: Internal Medicine

## 2023-06-21 ENCOUNTER — Telehealth: Payer: Self-pay

## 2023-06-21 DIAGNOSIS — J84112 Idiopathic pulmonary fibrosis: Secondary | ICD-10-CM

## 2023-06-21 LAB — PULMONARY FUNCTION TEST
DL/VA % pred: 79 %
DL/VA: 3.09 ml/min/mmHg/L
DLCO unc % pred: 55 %
DLCO unc: 14.23 ml/min/mmHg
FEF 25-75 Pre: 1.09 L/s
FEF2575-%Pred-Pre: 50 %
FEV1-%Pred-Pre: 57 %
FEV1-Pre: 1.76 L
FEV1FVC-%Pred-Pre: 96 %
FEV6-%Pred-Pre: 62 %
FEV6-Pre: 2.52 L
FEV6FVC-%Pred-Pre: 106 %
FVC-%Pred-Pre: 59 %
FVC-Pre: 2.54 L
Pre FEV1/FVC ratio: 69 %
Pre FEV6/FVC Ratio: 100 %

## 2023-06-21 NOTE — Telephone Encounter (Signed)
 Copied from CRM [id number]. Topic: Appointments - Scheduling Inquiry for Clinic >> Jun 18, 2023 12:04 PM Jose Hines wrote: Reason for CRM: Patient's spouse returning phone call, he does not want to go to [facility] to complete his PFT.   Lm for pt.

## 2023-06-21 NOTE — Telephone Encounter (Signed)
 Spoke to pt. He stated that he had PFT today at [facility].  Nothing further needed at this time.

## 2023-06-21 NOTE — Telephone Encounter (Signed)
 PT ret Margie's call. No PFT appt's at [facility] or [facility] this week. Please ret his call. TY.

## 2023-06-22 ENCOUNTER — Other Ambulatory Visit: Payer: Self-pay | Admitting: Pulmonary Disease

## 2023-06-23 ENCOUNTER — Other Ambulatory Visit (HOSPITAL_COMMUNITY): Payer: Self-pay

## 2023-06-23 ENCOUNTER — Encounter: Payer: Self-pay | Admitting: Cardiology

## 2023-06-23 ENCOUNTER — Ambulatory Visit: Attending: Cardiology | Admitting: Cardiology

## 2023-06-23 VITALS — BP 100/58 | HR 69 | Ht 71.0 in | Wt 213.8 lb

## 2023-06-23 DIAGNOSIS — Z01812 Encounter for preprocedural laboratory examination: Secondary | ICD-10-CM

## 2023-06-23 DIAGNOSIS — Z01810 Encounter for preprocedural cardiovascular examination: Secondary | ICD-10-CM | POA: Diagnosis not present

## 2023-06-23 DIAGNOSIS — I5022 Chronic systolic (congestive) heart failure: Secondary | ICD-10-CM | POA: Diagnosis not present

## 2023-06-23 DIAGNOSIS — J84112 Idiopathic pulmonary fibrosis: Secondary | ICD-10-CM | POA: Diagnosis not present

## 2023-06-23 DIAGNOSIS — K219 Gastro-esophageal reflux disease without esophagitis: Secondary | ICD-10-CM

## 2023-06-23 DIAGNOSIS — I340 Nonrheumatic mitral (valve) insufficiency: Secondary | ICD-10-CM | POA: Diagnosis not present

## 2023-06-23 DIAGNOSIS — I25118 Atherosclerotic heart disease of native coronary artery with other forms of angina pectoris: Secondary | ICD-10-CM

## 2023-06-23 MED ORDER — CLOPIDOGREL BISULFATE 75 MG PO TABS
75.0000 mg | ORAL_TABLET | Freq: Every day | ORAL | 1 refills | Status: DC
  Filled 2023-06-23: qty 90, 90d supply, fill #0

## 2023-06-23 MED ORDER — PANTOPRAZOLE SODIUM 40 MG PO TBEC
40.0000 mg | DELAYED_RELEASE_TABLET | Freq: Every day | ORAL | 1 refills | Status: DC
  Filled 2023-06-23: qty 90, 90d supply, fill #0

## 2023-06-23 NOTE — Progress Notes (Signed)
 Cardiology Office Note:  .   Date:  06/23/2023  ID:  Jose Baton., DOB [birth year], MRN [id number] PCP: Rae Bugler, MD  [facility] Prov[facility] Cardiologist:  Knox Perl, MD   History of Present Illness: .   Jose July Nickson. is a 78 y.o. Caucasian patient with hypercholesterolemia, primary hypertension, OSA on CPAP, COPD with mild obstructive disease, alcoholic peripheral neuropathy and has reduced alcohol intake significantly over the past 2 years, coronary artery disease by cardiac catheterization performed due to abnormal stress test and dyspnea on exertion on 11/19/2022 with occluded RCA and heavily calcified proximal and mid LAD with severe LV systolic dysfunction at 30 to 82% and recommended consideration for CABG + MV replacement but felt to be high risk and turndown for CABG.  Patient was also evaluated by Dr. Sumner Ends from structural heart team to evaluate for mitral valve TEER, also evaluated by heart failure team with Dr. Bruce Caper and from pulmonary standpoint Dr, Bertrum Brodie.  Multidisciplinary rounds held final decision made to proceed with PCI to the LAD.  Patient presents here to further discuss.  Discussed the use of AI scribe software for clinical note transcription with the patient, who gave verbal consent to proceed.  History of Present Illness Jose O Jonas Goh. is a 78 year old male with coronary artery disease and severe mitral regurgitation who presents for evaluation of his heart condition and shortness of breath.  He experiences significant shortness of breath, limiting physical activity. He uses a nebulizer twice daily. No leg swelling is present. He sleeps with three pillows, which may indicate orthopnea.  He has coronary artery disease with heart blockages and severe mitral regurgitation. Previous discussions with specialists have considered bypass surgery and mitral valve repair.  Current medications include losartan , Lasix , and metoprolol  succinate  at minimal doses due to low blood pressure. He also uses albuterol  in inhaler and nebulizer forms.  He has a decreased appetite and has lost the desire for alcohol, consuming only one drink recently. He denies tobacco use.  Labs   Lab Results  Component Value Date   CHOL 137 11/27/2022   HDL 46 11/27/2022   LDLCALC 62 11/27/2022   TRIG 175 (H) 11/27/2022   CHOLHDL 3.0 11/27/2022   Lab Results  Component Value Date   NA 138 06/14/2023   K 3.3 (L) 06/14/2023   CO2 30 06/14/2023   GLUCOSE 130 (H) 06/14/2023   BUN 9 06/14/2023   CREATININE 0.78 06/14/2023   CALCIUM  8.5 (L) 06/14/2023   EGFR 91 12/21/2022   GFRNONAA >60 06/14/2023      Latest Ref Rng & Units 06/14/2023   12:00 PM 04/25/2023    9:14 AM 04/24/2023    7:02 AM  BMP  Glucose 70 - 99 mg/dL 956  213  086   BUN 8 - 23 mg/dL 9  15  11    Creatinine 0.61 - 1.24 mg/dL 5.78  4.69  6.29   Sodium 135 - 145 mmol/L 138  137  137   Potassium 3.5 - 5.1 mmol/L 3.3  4.0  4.0   Chloride 98 - 111 mmol/L 102  101  103   CO2 22 - 32 mmol/L 30  25  25    Calcium  8.9 - 10.3 mg/dL 8.5  9.2  9.3       Latest Ref Rng & Units 04/25/2023    9:14 AM 04/24/2023    7:02 AM 04/23/2023    4:20 PM  CBC  WBC 4.0 -  10.5 K/uL 15.7  5.6  9.6   Hemoglobin 13.0 - 17.0 g/dL 78.2  95.6  21.3   Hematocrit 39.0 - 52.0 % 42.5  40.6  43.2   Platelets 150 - 400 K/uL 223  200  203    Lab Results  Component Value Date   HGBA1C 6.6 (H) 01/26/2023    Lab Results  Component Value Date   TSH 1.610 11/13/2022    ROS  Review of Systems  Cardiovascular:  Positive for dyspnea on exertion and orthopnea. Negative for chest pain, leg swelling and paroxysmal nocturnal dyspnea.    Physical Exam:   VS:  BP (!) 100/58   Pulse 69   Ht 5' 11 (1.803 m)   Wt 213 lb 12.8 oz (97 kg)   SpO2 91%   BMI 29.82 kg/m    Wt Readings from Last 3 Encounters:  06/23/23 213 lb 12.8 oz (97 kg)  06/14/23 216 lb 3.2 oz (98.1 kg)  05/18/23 216 lb (98 kg)    Physical  Exam Neck:     Vascular: No carotid bruit or JVD.   Cardiovascular:     Rate and Rhythm: Normal rate and regular rhythm.     Pulses: Intact distal pulses.     Heart sounds: Heart sounds are distant. Murmur heard.     Midsystolic murmur is present with a grade of 2/6.     No gallop.  Pulmonary:     Effort: Pulmonary effort is normal.     Breath sounds: Decreased air movement present. Examination of the right-lower field reveals rales. Examination of the left-lower field reveals rales. Rales present.  Abdominal:     General: Bowel sounds are normal.     Palpations: Abdomen is soft.   Musculoskeletal:     Right lower leg: No edema.     Left lower leg: No edema.    Studies Reviewed: Aaron Aas     EKG:       EKG 06/14/2023: Normal sinus rhythm at rate of 75 bpm, normal axis, LVH with repolarization abnormality.  Compared to 04/23/2023, frequent PVCs not present and a lateral ST depression with T wave inversion more pronounced.  Medications ordered    Meds ordered this encounter  Medications   clopidogrel (PLAVIX) 75 MG tablet    Sig: Take 1 tablet (75 mg total) by mouth daily.    Dispense:  90 tablet    Refill:  1   pantoprazole  (PROTONIX ) 40 MG tablet    Sig: Take 1 tablet (40 mg total) by mouth daily before breakfast.    Dispense:  90 tablet    Refill:  1     ASSESSMENT AND PLAN: .      ICD-10-CM   1. Coronary artery disease involving native coronary artery of native heart with other form of angina pectoris (HCC)  I25.118 clopidogrel (PLAVIX) 75 MG tablet    Basic metabolic panel with GFR    CBC    CBC    Basic metabolic panel with GFR    2. Chronic HFrEF (heart failure with reduced ejection fraction) (HCC)  I50.22 Basic metabolic panel with GFR    CBC    CBC    Basic metabolic panel with GFR    3. Severe mitral regurgitation  I34.0 Basic metabolic panel with GFR    CBC    CBC    Basic metabolic panel with GFR    4. IPF (idiopathic pulmonary fibrosis) (HCC)  J84.112  Basic metabolic panel with GFR  CBC    CBC    Basic metabolic panel with GFR    5. Gastroesophageal reflux disease without esophagitis  K21.9 pantoprazole  (PROTONIX ) 40 MG tablet    Basic metabolic panel with GFR    CBC    CBC    Basic metabolic panel with GFR    6. Pre-procedure lab exam  Z01.812 Basic metabolic panel with GFR    CBC    CBC    Basic metabolic panel with GFR      Assessment and Plan Assessment & Plan Coronary artery disease with severe calcification Severe calcification in coronary arteries with blockages. Bypass surgery deemed too risky. Multidisciplinary team decided on angioplasty to the LAD to improve blood circulation and heart function. Discussed risks including 1-2% risk of death, stroke, heart attack, and potential need for emergency surgery which may not be feasible. Aim is to improve quality of life, not necessarily prolong it. Informed consent obtained regarding risks, including bleeding, kidney function decline, and perforation. - Perform angioplasty to the LAD with atherectomy. - Start clopidogrel once daily. - Stop esomeprazole  and start pantoprazole  due to interaction with clopidogrel. - Continue aspirin  therapy.  Severe mitral regurgitation Severe mitral regurgitation present. Plan to address mitral valve if symptoms persist after angioplasty. Potential for transcutaneous edge-to-edge repair (TEER) if necessary. - Evaluate mitral regurgitation post-angioplasty. - Consider TEER if symptoms persist.  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 Currently well-compensated with no clinical signs of heart failure. On minimal medication due to low blood pressure. - Continue current medications: losartan , Lasix , and metoprolol  succinate.  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 Pulmonary function tests planned to assess  condition further. - Perform pulmonary function tests.  Goals of Care Discussed potential outcomes and risks of angioplasty, including the possibility of not performing CPR if complications arise. Focus is on improving quality of life rather than prolonging it.  Follow-up - Schedule follow-up appointment in six weeks post-angioplasty.   Signed,  Knox Perl, MD, [facility] 06/23/2023, 7:17 PM [facility] [address] [place], [place] [address] Phone: [phone]. Fax:  [phone]

## 2023-06-23 NOTE — H&P (View-Only) (Signed)
 Cardiology Office Note:  .   Date:  06/23/2023  ID:  Jose Baton., DOB [birth year], MRN [id number] PCP: Rae Bugler, MD  [facility] Prov[facility] Cardiologist:  Knox Perl, MD   History of Present Illness: .   Jose July Nickson. is a 78 y.o. Caucasian patient with hypercholesterolemia, primary hypertension, OSA on CPAP, COPD with mild obstructive disease, alcoholic peripheral neuropathy and has reduced alcohol intake significantly over the past 2 years, coronary artery disease by cardiac catheterization performed due to abnormal stress test and dyspnea on exertion on 11/19/2022 with occluded RCA and heavily calcified proximal and mid LAD with severe LV systolic dysfunction at 30 to 82% and recommended consideration for CABG + MV replacement but felt to be high risk and turndown for CABG.  Patient was also evaluated by Dr. Sumner Ends from structural heart team to evaluate for mitral valve TEER, also evaluated by heart failure team with Dr. Bruce Caper and from pulmonary standpoint Dr, Bertrum Brodie.  Multidisciplinary rounds held final decision made to proceed with PCI to the LAD.  Patient presents here to further discuss.  Discussed the use of AI scribe software for clinical note transcription with the patient, who gave verbal consent to proceed.  History of Present Illness Jose O Jonas Goh. is a 78 year old male with coronary artery disease and severe mitral regurgitation who presents for evaluation of his heart condition and shortness of breath.  He experiences significant shortness of breath, limiting physical activity. He uses a nebulizer twice daily. No leg swelling is present. He sleeps with three pillows, which may indicate orthopnea.  He has coronary artery disease with heart blockages and severe mitral regurgitation. Previous discussions with specialists have considered bypass surgery and mitral valve repair.  Current medications include losartan , Lasix , and metoprolol  succinate  at minimal doses due to low blood pressure. He also uses albuterol  in inhaler and nebulizer forms.  He has a decreased appetite and has lost the desire for alcohol, consuming only one drink recently. He denies tobacco use.  Labs   Lab Results  Component Value Date   CHOL 137 11/27/2022   HDL 46 11/27/2022   LDLCALC 62 11/27/2022   TRIG 175 (H) 11/27/2022   CHOLHDL 3.0 11/27/2022   Lab Results  Component Value Date   NA 138 06/14/2023   K 3.3 (L) 06/14/2023   CO2 30 06/14/2023   GLUCOSE 130 (H) 06/14/2023   BUN 9 06/14/2023   CREATININE 0.78 06/14/2023   CALCIUM  8.5 (L) 06/14/2023   EGFR 91 12/21/2022   GFRNONAA >60 06/14/2023      Latest Ref Rng & Units 06/14/2023   12:00 PM 04/25/2023    9:14 AM 04/24/2023    7:02 AM  BMP  Glucose 70 - 99 mg/dL 956  213  086   BUN 8 - 23 mg/dL 9  15  11    Creatinine 0.61 - 1.24 mg/dL 5.78  4.69  6.29   Sodium 135 - 145 mmol/L 138  137  137   Potassium 3.5 - 5.1 mmol/L 3.3  4.0  4.0   Chloride 98 - 111 mmol/L 102  101  103   CO2 22 - 32 mmol/L 30  25  25    Calcium  8.9 - 10.3 mg/dL 8.5  9.2  9.3       Latest Ref Rng & Units 04/25/2023    9:14 AM 04/24/2023    7:02 AM 04/23/2023    4:20 PM  CBC  WBC 4.0 -  10.5 K/uL 15.7  5.6  9.6   Hemoglobin 13.0 - 17.0 g/dL 78.2  95.6  21.3   Hematocrit 39.0 - 52.0 % 42.5  40.6  43.2   Platelets 150 - 400 K/uL 223  200  203    Lab Results  Component Value Date   HGBA1C 6.6 (H) 01/26/2023    Lab Results  Component Value Date   TSH 1.610 11/13/2022    ROS  Review of Systems  Cardiovascular:  Positive for dyspnea on exertion and orthopnea. Negative for chest pain, leg swelling and paroxysmal nocturnal dyspnea.    Physical Exam:   VS:  BP (!) 100/58   Pulse 69   Ht 5' 11 (1.803 m)   Wt 213 lb 12.8 oz (97 kg)   SpO2 91%   BMI 29.82 kg/m    Wt Readings from Last 3 Encounters:  06/23/23 213 lb 12.8 oz (97 kg)  06/14/23 216 lb 3.2 oz (98.1 kg)  05/18/23 216 lb (98 kg)    Physical  Exam Neck:     Vascular: No carotid bruit or JVD.   Cardiovascular:     Rate and Rhythm: Normal rate and regular rhythm.     Pulses: Intact distal pulses.     Heart sounds: Heart sounds are distant. Murmur heard.     Midsystolic murmur is present with a grade of 2/6.     No gallop.  Pulmonary:     Effort: Pulmonary effort is normal.     Breath sounds: Decreased air movement present. Examination of the right-lower field reveals rales. Examination of the left-lower field reveals rales. Rales present.  Abdominal:     General: Bowel sounds are normal.     Palpations: Abdomen is soft.   Musculoskeletal:     Right lower leg: No edema.     Left lower leg: No edema.    Studies Reviewed: Aaron Aas     EKG:       EKG 06/14/2023: Normal sinus rhythm at rate of 75 bpm, normal axis, LVH with repolarization abnormality.  Compared to 04/23/2023, frequent PVCs not present and a lateral ST depression with T wave inversion more pronounced.  Medications ordered    Meds ordered this encounter  Medications   clopidogrel (PLAVIX) 75 MG tablet    Sig: Take 1 tablet (75 mg total) by mouth daily.    Dispense:  90 tablet    Refill:  1   pantoprazole  (PROTONIX ) 40 MG tablet    Sig: Take 1 tablet (40 mg total) by mouth daily before breakfast.    Dispense:  90 tablet    Refill:  1     ASSESSMENT AND PLAN: .      ICD-10-CM   1. Coronary artery disease involving native coronary artery of native heart with other form of angina pectoris (HCC)  I25.118 clopidogrel (PLAVIX) 75 MG tablet    Basic metabolic panel with GFR    CBC    CBC    Basic metabolic panel with GFR    2. Chronic HFrEF (heart failure with reduced ejection fraction) (HCC)  I50.22 Basic metabolic panel with GFR    CBC    CBC    Basic metabolic panel with GFR    3. Severe mitral regurgitation  I34.0 Basic metabolic panel with GFR    CBC    CBC    Basic metabolic panel with GFR    4. IPF (idiopathic pulmonary fibrosis) (HCC)  J84.112  Basic metabolic panel with GFR  CBC    CBC    Basic metabolic panel with GFR    5. Gastroesophageal reflux disease without esophagitis  K21.9 pantoprazole  (PROTONIX ) 40 MG tablet    Basic metabolic panel with GFR    CBC    CBC    Basic metabolic panel with GFR    6. Pre-procedure lab exam  Z01.812 Basic metabolic panel with GFR    CBC    CBC    Basic metabolic panel with GFR      Assessment and Plan Assessment & Plan Coronary artery disease with severe calcification Severe calcification in coronary arteries with blockages. Bypass surgery deemed too risky. Multidisciplinary team decided on angioplasty to the LAD to improve blood circulation and heart function. Discussed risks including 1-2% risk of death, stroke, heart attack, and potential need for emergency surgery which may not be feasible. Aim is to improve quality of life, not necessarily prolong it. Informed consent obtained regarding risks, including bleeding, kidney function decline, and perforation. - Perform angioplasty to the LAD with atherectomy. - Start clopidogrel once daily. - Stop esomeprazole  and start pantoprazole  due to interaction with clopidogrel. - Continue aspirin  therapy.  Severe mitral regurgitation Severe mitral regurgitation present. Plan to address mitral valve if symptoms persist after angioplasty. Potential for transcutaneous edge-to-edge repair (TEER) if necessary. - Evaluate mitral regurgitation post-angioplasty. - Consider TEER if symptoms persist.  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 Currently well-compensated with no clinical signs of heart failure. On minimal medication due to low blood pressure. - Continue current medications: losartan , Lasix , and metoprolol  succinate.  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 Pulmonary function tests planned to assess  condition further. - Perform pulmonary function tests.  Goals of Care Discussed potential outcomes and risks of angioplasty, including the possibility of not performing CPR if complications arise. Focus is on improving quality of life rather than prolonging it.  Follow-up - Schedule follow-up appointment in six weeks post-angioplasty.   Signed,  Knox Perl, MD, [facility] 06/23/2023, 7:17 PM [facility] [address] [place], [place] [address] Phone: [phone]. Fax:  [phone]

## 2023-06-23 NOTE — Patient Instructions (Addendum)
 Medication Instructions:  Your physician has recommended you make the following change in your medication: Stop Nexium  Start Pantoprazole  40 mg by mouth daily before breakfast Start Clopidogrel 75 mg by mouth daily   *If you need a refill on your cardiac medications before your next appointment, please call your pharmacy*  Lab Work: Today: cbc, bmet If you have labs (blood work) drawn today and your tests are completely normal, you will receive your results only by: MyChart Message (if you have MyChart) OR A paper copy in the mail If you have any lab test that is abnormal or we need to change your treatment, we will call you to review the results.  Testing/Procedures: Your physician has requested that you have a cardiac catheterization. Cardiac catheterization is used to diagnose and/or treat various heart conditions. Doctors may recommend this procedure for a number of different reasons. The most common reason is to evaluate chest pain. Chest pain can be a symptom of coronary artery disease (CAD), and cardiac catheterization can show whether plaque is narrowing or blocking your heart's arteries. This procedure is also used to evaluate the valves, as well as measure the blood flow and oxygen levels in different parts of your heart. For further information please visit [email or web address] Please follow instruction sheet, as given.  Follow-Up: At [facility], you and your health needs are our priority.  As part of our continuing mission to provide you with exceptional heart care, our providers are all part of one team.  This team includes your primary Cardiologist (physician) and Advanced Practice Providers or APPs (Physician Assistants and Nurse Practitioners) who all work together to provide you with the care you need, when you need it.  Your next appointment:   August 12 at 8 AM   Provider:   Knox Perl, MD    We recommend signing up for the patient portal called MyChart.   Sign up information is provided on this After Visit Summary.  MyChart is used to connect with patients for Virtual Visits (Telemedicine).  Patients are able to view lab/test results, encounter notes, upcoming appointments, etc.  Non-urgent messages can be sent to your provider as well.   To learn more about what you can do with MyChart, go to [email or web address].   Other Instructions        Cardiac/Peripheral Catheterization   You are scheduled for a Cardiac Catheterization on Tuesday, June 24 with Dr. Knox Perl.  1. Please arrive at the [facility] (Main Entrance A) at [facility]: [address] [place], [place] [address] at 7:00 AM (This time is 2 hour(s) before your procedure to ensure your preparation).   Free valet parking service is available. You will check in at ADMITTING. The support person will be asked to wait in the waiting room.  It is OK to have someone drop you off and come back when you are ready to be discharged.        Special note: Every effort is made to have your procedure done on time. Please understand that emergencies sometimes delay scheduled procedures.  2. Diet: Do not eat solid foods after midnight.  You may have clear liquids until 5 AM the day of the procedure.  3. Labs: You will need to have blood drawn today: CBC and BMET   You do not need to be fasting.  4. Medication instructions in preparation for your procedure:   Contrast Allergy: No    Do not take Furosemide  the morning of your  procedure  On the morning of your procedure, take Aspirin  81 mg and Clopidogrel. and any morning medicines NOT listed above.  You may use sips of water.  5. Plan to go home the same day, you will only stay overnight if medically necessary. 6. You MUST have a responsible adult to drive you home. 7. An adult MUST be with you the first 24 hours after you arrive home. 8. Bring a current list of your medications, and the last time and date medication  taken. 9. Bring ID and current insurance cards. 10.Please wear clothes that are easy to get on and off and wear slip-on shoes.  Thank you for allowing us  to care for you!   --  Invasive Cardiovascular services

## 2023-06-24 ENCOUNTER — Ambulatory Visit: Payer: Self-pay | Admitting: Cardiology

## 2023-06-24 LAB — CBC
Hematocrit: 40.9 % (ref 37.5–51.0)
Hemoglobin: 14.4 g/dL (ref 13.0–17.7)
MCH: 36.5 pg — ABNORMAL HIGH (ref 26.6–33.0)
MCHC: 35.2 g/dL (ref 31.5–35.7)
MCV: 104 fL — ABNORMAL HIGH (ref 79–97)
Platelets: 241 10*3/uL (ref 150–450)
RBC: 3.94 x10E6/uL — ABNORMAL LOW (ref 4.14–5.80)
RDW: 13.8 % (ref 11.6–15.4)
WBC: 11.1 10*3/uL — ABNORMAL HIGH (ref 3.4–10.8)

## 2023-06-24 LAB — BASIC METABOLIC PANEL WITH GFR
BUN/Creatinine Ratio: 14 (ref 10–24)
BUN: 10 mg/dL (ref 8–27)
CO2: 23 mmol/L (ref 20–29)
Calcium: 9.3 mg/dL (ref 8.6–10.2)
Chloride: 103 mmol/L (ref 96–106)
Creatinine, Ser: 0.71 mg/dL — ABNORMAL LOW (ref 0.76–1.27)
Glucose: 106 mg/dL — ABNORMAL HIGH (ref 70–99)
Potassium: 3.6 mmol/L (ref 3.5–5.2)
Sodium: 145 mmol/L — ABNORMAL HIGH (ref 134–144)
eGFR: 94 mL/min/{1.73_m2} (ref >59)

## 2023-06-29 ENCOUNTER — Encounter (HOSPITAL_COMMUNITY)
Admission: AD | Disposition: A | Discharge status: Home Health | Payer: Self-pay | Source: Home / Self Care | Attending: Cardiology

## 2023-06-29 ENCOUNTER — Observation Stay (HOSPITAL_COMMUNITY)

## 2023-06-29 ENCOUNTER — Inpatient Hospital Stay (HOSPITAL_COMMUNITY)

## 2023-06-29 ENCOUNTER — Encounter (HOSPITAL_COMMUNITY): Payer: Self-pay | Admitting: Cardiovascular Disease

## 2023-06-29 ENCOUNTER — Other Ambulatory Visit: Payer: Self-pay

## 2023-06-29 ENCOUNTER — Inpatient Hospital Stay (HOSPITAL_COMMUNITY)
Admission: AD | Admit: 2023-06-29 | Discharge: 2023-07-04 | DRG: 321 | Disposition: A | Discharge status: Home Health | Attending: Cardiovascular Disease | Admitting: Cardiovascular Disease

## 2023-06-29 ENCOUNTER — Ambulatory Visit (HOSPITAL_COMMUNITY): Admit: 2023-06-29 | Admitting: Cardiovascular Disease

## 2023-06-29 DIAGNOSIS — E66811 Obesity, class 1: Secondary | ICD-10-CM | POA: Diagnosis not present

## 2023-06-29 DIAGNOSIS — I312 Hemopericardium, not elsewhere classified: Secondary | ICD-10-CM | POA: Diagnosis not present

## 2023-06-29 DIAGNOSIS — J4489 Other specified chronic obstructive pulmonary disease: Secondary | ICD-10-CM | POA: Diagnosis present

## 2023-06-29 DIAGNOSIS — I959 Hypotension, unspecified: Secondary | ICD-10-CM | POA: Diagnosis present

## 2023-06-29 DIAGNOSIS — Y712 Prosthetic and other implants, materials and accessory cardiovascular devices associated with adverse incidents: Secondary | ICD-10-CM | POA: Diagnosis not present

## 2023-06-29 DIAGNOSIS — I493 Ventricular premature depolarization: Secondary | ICD-10-CM | POA: Diagnosis present

## 2023-06-29 DIAGNOSIS — G4733 Obstructive sleep apnea (adult) (pediatric): Secondary | ICD-10-CM | POA: Diagnosis present

## 2023-06-29 DIAGNOSIS — I25118 Atherosclerotic heart disease of native coronary artery with other forms of angina pectoris: Secondary | ICD-10-CM | POA: Diagnosis not present

## 2023-06-29 DIAGNOSIS — J811 Chronic pulmonary edema: Secondary | ICD-10-CM | POA: Diagnosis not present

## 2023-06-29 DIAGNOSIS — I509 Heart failure, unspecified: Secondary | ICD-10-CM | POA: Diagnosis not present

## 2023-06-29 DIAGNOSIS — T504X5A Adverse effect of drugs affecting uric acid metabolism, initial encounter: Secondary | ICD-10-CM | POA: Diagnosis not present

## 2023-06-29 DIAGNOSIS — R570 Cardiogenic shock: Secondary | ICD-10-CM

## 2023-06-29 DIAGNOSIS — I9581 Postprocedural hypotension: Secondary | ICD-10-CM | POA: Diagnosis present

## 2023-06-29 DIAGNOSIS — Z7902 Long term (current) use of antithrombotics/antiplatelets: Secondary | ICD-10-CM

## 2023-06-29 DIAGNOSIS — I3139 Other pericardial effusion (noninflammatory): Secondary | ICD-10-CM

## 2023-06-29 DIAGNOSIS — Z79899 Other long term (current) drug therapy: Secondary | ICD-10-CM

## 2023-06-29 DIAGNOSIS — R4182 Altered mental status, unspecified: Secondary | ICD-10-CM | POA: Diagnosis not present

## 2023-06-29 DIAGNOSIS — N400 Enlarged prostate without lower urinary tract symptoms: Secondary | ICD-10-CM | POA: Diagnosis not present

## 2023-06-29 DIAGNOSIS — I517 Cardiomegaly: Secondary | ICD-10-CM | POA: Diagnosis not present

## 2023-06-29 DIAGNOSIS — J84112 Idiopathic pulmonary fibrosis: Secondary | ICD-10-CM | POA: Diagnosis present

## 2023-06-29 DIAGNOSIS — I5042 Chronic combined systolic (congestive) and diastolic (congestive) heart failure: Secondary | ICD-10-CM | POA: Diagnosis present

## 2023-06-29 DIAGNOSIS — Y92230 Patient room in hospital as the place of occurrence of the external cause: Secondary | ICD-10-CM | POA: Diagnosis not present

## 2023-06-29 DIAGNOSIS — K5730 Diverticulosis of large intestine without perforation or abscess without bleeding: Secondary | ICD-10-CM | POA: Diagnosis not present

## 2023-06-29 DIAGNOSIS — Z6830 Body mass index (BMI) 30.0-30.9, adult: Secondary | ICD-10-CM

## 2023-06-29 DIAGNOSIS — M549 Dorsalgia, unspecified: Secondary | ICD-10-CM | POA: Diagnosis present

## 2023-06-29 DIAGNOSIS — I472 Ventricular tachycardia, unspecified: Secondary | ICD-10-CM | POA: Diagnosis present

## 2023-06-29 DIAGNOSIS — I255 Ischemic cardiomyopathy: Secondary | ICD-10-CM | POA: Diagnosis present

## 2023-06-29 DIAGNOSIS — R0989 Other specified symptoms and signs involving the circulatory and respiratory systems: Secondary | ICD-10-CM | POA: Diagnosis not present

## 2023-06-29 DIAGNOSIS — F32A Depression, unspecified: Secondary | ICD-10-CM | POA: Diagnosis present

## 2023-06-29 DIAGNOSIS — T82524A Displacement of infusion catheter, initial encounter: Secondary | ICD-10-CM | POA: Diagnosis not present

## 2023-06-29 DIAGNOSIS — K219 Gastro-esophageal reflux disease without esophagitis: Secondary | ICD-10-CM | POA: Diagnosis present

## 2023-06-29 DIAGNOSIS — I340 Nonrheumatic mitral (valve) insufficiency: Secondary | ICD-10-CM | POA: Diagnosis present

## 2023-06-29 DIAGNOSIS — K429 Umbilical hernia without obstruction or gangrene: Secondary | ICD-10-CM | POA: Diagnosis not present

## 2023-06-29 DIAGNOSIS — Z7982 Long term (current) use of aspirin: Secondary | ICD-10-CM

## 2023-06-29 DIAGNOSIS — E876 Hypokalemia: Secondary | ICD-10-CM | POA: Diagnosis present

## 2023-06-29 DIAGNOSIS — J9811 Atelectasis: Secondary | ICD-10-CM | POA: Diagnosis not present

## 2023-06-29 DIAGNOSIS — Z452 Encounter for adjustment and management of vascular access device: Secondary | ICD-10-CM | POA: Diagnosis not present

## 2023-06-29 DIAGNOSIS — Z860101 Personal history of adenomatous and serrated colon polyps: Secondary | ICD-10-CM | POA: Diagnosis not present

## 2023-06-29 DIAGNOSIS — Z955 Presence of coronary angioplasty implant and graft: Principal | ICD-10-CM

## 2023-06-29 DIAGNOSIS — I110 Hypertensive heart disease with heart failure: Secondary | ICD-10-CM | POA: Diagnosis present

## 2023-06-29 DIAGNOSIS — Z87442 Personal history of urinary calculi: Secondary | ICD-10-CM | POA: Diagnosis not present

## 2023-06-29 DIAGNOSIS — I314 Cardiac tamponade: Secondary | ICD-10-CM | POA: Diagnosis not present

## 2023-06-29 DIAGNOSIS — I2510 Atherosclerotic heart disease of native coronary artery without angina pectoris: Secondary | ICD-10-CM | POA: Diagnosis not present

## 2023-06-29 DIAGNOSIS — I5022 Chronic systolic (congestive) heart failure: Secondary | ICD-10-CM | POA: Diagnosis not present

## 2023-06-29 DIAGNOSIS — M47818 Spondylosis without myelopathy or radiculopathy, sacral and sacrococcygeal region: Secondary | ICD-10-CM | POA: Diagnosis present

## 2023-06-29 DIAGNOSIS — J90 Pleural effusion, not elsewhere classified: Secondary | ICD-10-CM | POA: Diagnosis not present

## 2023-06-29 DIAGNOSIS — I309 Acute pericarditis, unspecified: Secondary | ICD-10-CM | POA: Diagnosis not present

## 2023-06-29 DIAGNOSIS — R579 Shock, unspecified: Secondary | ICD-10-CM

## 2023-06-29 DIAGNOSIS — I2582 Chronic total occlusion of coronary artery: Secondary | ICD-10-CM | POA: Diagnosis present

## 2023-06-29 DIAGNOSIS — M47816 Spondylosis without myelopathy or radiculopathy, lumbar region: Secondary | ICD-10-CM | POA: Diagnosis present

## 2023-06-29 DIAGNOSIS — R110 Nausea: Secondary | ICD-10-CM | POA: Diagnosis not present

## 2023-06-29 DIAGNOSIS — Z974 Presence of external hearing-aid: Secondary | ICD-10-CM

## 2023-06-29 DIAGNOSIS — R739 Hyperglycemia, unspecified: Secondary | ICD-10-CM | POA: Diagnosis present

## 2023-06-29 DIAGNOSIS — Z7951 Long term (current) use of inhaled steroids: Secondary | ICD-10-CM

## 2023-06-29 HISTORY — DX: Cardiogenic shock: R57.0

## 2023-06-29 HISTORY — PX: CENTRAL LINE INSERTION: CATH118232

## 2023-06-29 HISTORY — DX: Hypotension, unspecified: I95.9

## 2023-06-29 HISTORY — DX: Postprocedural hypotension: I95.81

## 2023-06-29 HISTORY — PX: CORONARY ATHERECTOMY: CATH118238

## 2023-06-29 HISTORY — PX: CORONARY STENT INTERVENTION: CATH118234

## 2023-06-29 HISTORY — PX: PERICARDIOCENTESIS: CATH118255

## 2023-06-29 HISTORY — PX: ARTERIAL LINE INSERTION: CATH118227

## 2023-06-29 LAB — POCT I-STAT EG7
Acid-base deficit: 4 mmol/L — ABNORMAL HIGH (ref 0.0–2.0)
Bicarbonate: 23.5 mmol/L (ref 20.0–28.0)
Calcium, Ion: 1.12 mmol/L — ABNORMAL LOW (ref 1.15–1.40)
HCT: 39 % (ref 39.0–52.0)
Hemoglobin: 13.3 g/dL (ref 13.0–17.0)
O2 Saturation: 66 %
Potassium: 3.4 mmol/L — ABNORMAL LOW (ref 3.5–5.1)
Sodium: 138 mmol/L (ref 135–145)
TCO2: 25 mmol/L (ref 22–32)
pCO2, Ven: 51.5 mmHg (ref 44–60)
pH, Ven: 7.268 (ref 7.25–7.43)
pO2, Ven: 40 mmHg (ref 32–45)

## 2023-06-29 LAB — ECHOCARDIOGRAM COMPLETE
Est EF: <20
Height: 71 in
Single Plane A4C EF: 14.5 %
Weight: 3456 [oz_av]

## 2023-06-29 LAB — BODY FLUID CELL COUNT WITH DIFFERENTIAL
Eos, Fluid: 5 %
Lymphs, Fluid: 12 %
Monocyte-Macrophage-Serous Fluid: 2 % — ABNORMAL LOW (ref 50–90)
Neutrophil Count, Fluid: 81 % — ABNORMAL HIGH (ref 0–25)
Total Nucleated Cell Count, Fluid: 3725 uL — ABNORMAL HIGH (ref 0–1000)

## 2023-06-29 LAB — COOXEMETRY PANEL
Carboxyhemoglobin: 1.9 % — ABNORMAL HIGH (ref 0.5–1.5)
Methemoglobin: <0.7 % (ref 0.0–1.5)
O2 Saturation: 60.9 %
Total hemoglobin: 13 g/dL (ref 12.0–16.0)

## 2023-06-29 LAB — CBC
HCT: 35.7 % — ABNORMAL LOW (ref 39.0–52.0)
HCT: 37.7 % — ABNORMAL LOW (ref 39.0–52.0)
Hemoglobin: 12.2 g/dL — ABNORMAL LOW (ref 13.0–17.0)
Hemoglobin: 12.8 g/dL — ABNORMAL LOW (ref 13.0–17.0)
MCH: 35.2 pg — ABNORMAL HIGH (ref 26.0–34.0)
MCH: 35.4 pg — ABNORMAL HIGH (ref 26.0–34.0)
MCHC: 34 g/dL (ref 30.0–36.0)
MCHC: 34.2 g/dL (ref 30.0–36.0)
MCV: 102.9 fL — ABNORMAL HIGH (ref 80.0–100.0)
MCV: 104.1 fL — ABNORMAL HIGH (ref 80.0–100.0)
Platelets: 196 10*3/uL (ref 150–400)
Platelets: 234 10*3/uL (ref 150–400)
RBC: 3.47 MIL/uL — ABNORMAL LOW (ref 4.22–5.81)
RBC: 3.62 MIL/uL — ABNORMAL LOW (ref 4.22–5.81)
RDW: 14.1 % (ref 11.5–15.5)
RDW: 14.3 % (ref 11.5–15.5)
WBC: 21 10*3/uL — ABNORMAL HIGH (ref 4.0–10.5)
WBC: 26.6 10*3/uL — ABNORMAL HIGH (ref 4.0–10.5)
nRBC: 0 % (ref 0.0–0.2)
nRBC: 0.1 % (ref 0.0–0.2)

## 2023-06-29 LAB — COMPREHENSIVE METABOLIC PANEL WITH GFR
ALT: 16 U/L (ref 0–44)
AST: 28 U/L (ref 15–41)
Albumin: 3.1 g/dL — ABNORMAL LOW (ref 3.5–5.0)
Alkaline Phosphatase: 74 U/L (ref 38–126)
Anion gap: 13 (ref 5–15)
BUN: 12 mg/dL (ref 8–23)
CO2: 20 mmol/L — ABNORMAL LOW (ref 22–32)
Calcium: 8.2 mg/dL — ABNORMAL LOW (ref 8.9–10.3)
Chloride: 102 mmol/L (ref 98–111)
Creatinine, Ser: 0.93 mg/dL (ref 0.61–1.24)
GFR, Estimated: >60 mL/min (ref >60)
Glucose, Bld: 297 mg/dL — ABNORMAL HIGH (ref 70–99)
Potassium: 3.3 mmol/L — ABNORMAL LOW (ref 3.5–5.1)
Sodium: 135 mmol/L (ref 135–145)
Total Bilirubin: 1 mg/dL (ref 0.0–1.2)
Total Protein: 5.8 g/dL — ABNORMAL LOW (ref 6.5–8.1)

## 2023-06-29 LAB — CG4 I-STAT (LACTIC ACID): Lactic Acid, Venous: 3 mmol/L (ref 0.5–1.9)

## 2023-06-29 LAB — POCT ACTIVATED CLOTTING TIME
Activated Clotting Time: 227 s
Activated Clotting Time: 302 s

## 2023-06-29 LAB — TROPONIN I (HIGH SENSITIVITY): Troponin I (High Sensitivity): 663 ng/L (ref <18)

## 2023-06-29 LAB — BRAIN NATRIURETIC PEPTIDE: B Natriuretic Peptide: 575.2 pg/mL — ABNORMAL HIGH (ref 0.0–100.0)

## 2023-06-29 SURGERY — PERICARDIOCENTESIS
Anesthesia: LOCAL | Laterality: Right

## 2023-06-29 SURGERY — CORONARY ATHERECTOMY
Anesthesia: LOCAL

## 2023-06-29 MED ORDER — VASOPRESSIN 20 UNITS/100 ML INFUSION FOR SHOCK
0.0000 [IU]/min | INTRAVENOUS | Status: DC
  Administered 2023-06-29 – 2023-07-01 (×5): 0.03 [IU]/min via INTRAVENOUS
  Filled 2023-06-29 (×4): qty 100

## 2023-06-29 MED ORDER — LIDOCAINE HCL (CARDIAC) PF 100 MG/5ML IV SOSY
PREFILLED_SYRINGE | INTRAVENOUS | Status: AC
  Filled 2023-06-29: qty 5

## 2023-06-29 MED ORDER — ATROPINE SULFATE 1 MG/10ML IJ SOSY
PREFILLED_SYRINGE | INTRAMUSCULAR | Status: AC
  Filled 2023-06-29: qty 10

## 2023-06-29 MED ORDER — VERAPAMIL HCL 2.5 MG/ML IV SOLN
INTRAVENOUS | Status: AC
  Filled 2023-06-29: qty 2

## 2023-06-29 MED ORDER — CLOPIDOGREL BISULFATE 75 MG PO TABS
75.0000 mg | ORAL_TABLET | Freq: Every day | ORAL | Status: DC
  Administered 2023-06-30 – 2023-07-04 (×5): 75 mg via ORAL
  Filled 2023-06-29 (×5): qty 1

## 2023-06-29 MED ORDER — HEPARIN SODIUM (PORCINE) 1000 UNIT/ML IJ SOLN
INTRAMUSCULAR | Status: AC
  Filled 2023-06-29: qty 10

## 2023-06-29 MED ORDER — ONDANSETRON HCL 4 MG/2ML IJ SOLN
4.0000 mg | Freq: Four times a day (QID) | INTRAMUSCULAR | Status: DC | PRN
  Administered 2023-06-30 – 2023-07-01 (×2): 4 mg via INTRAVENOUS
  Filled 2023-06-29 (×2): qty 2

## 2023-06-29 MED ORDER — ASPIRIN 81 MG PO TBEC
81.0000 mg | DELAYED_RELEASE_TABLET | Freq: Every day | ORAL | Status: DC
  Administered 2023-06-30 – 2023-07-04 (×5): 81 mg via ORAL
  Filled 2023-06-29 (×5): qty 1

## 2023-06-29 MED ORDER — VANCOMYCIN HCL 2000 MG/400ML IV SOLN
2000.0000 mg | Freq: Once | INTRAVENOUS | Status: AC
  Administered 2023-06-29: 2000 mg via INTRAVENOUS
  Filled 2023-06-29: qty 400

## 2023-06-29 MED ORDER — SODIUM CHLORIDE 0.9% FLUSH: 3.0000 mL | INTRAVENOUS | Status: DC | PRN

## 2023-06-29 MED ORDER — SODIUM CHLORIDE 0.9 % IV SOLN
250.0000 mL | INTRAVENOUS | Status: AC | PRN
  Administered 2023-06-29: 250 mL via INTRAVENOUS

## 2023-06-29 MED ORDER — MIDAZOLAM HCL 2 MG/2ML IJ SOLN
INTRAMUSCULAR | Status: AC
  Filled 2023-06-29: qty 2

## 2023-06-29 MED ORDER — HEPARIN SODIUM (PORCINE) 5000 UNIT/ML IJ SOLN: 5000.0000 [IU] | Freq: Three times a day (TID) | INTRAMUSCULAR | Status: DC

## 2023-06-29 MED ORDER — VANCOMYCIN HCL 1500 MG/300ML IV SOLN
1500.0000 mg | INTRAVENOUS | Status: AC
  Administered 2023-06-30: 1500 mg via INTRAVENOUS
  Filled 2023-06-29: qty 300

## 2023-06-29 MED ORDER — PIRFENIDONE 267 MG PO TABS
801.0000 mg | ORAL_TABLET | Freq: Three times a day (TID) | ORAL | Status: DC
  Administered 2023-06-30: 801 mg via ORAL

## 2023-06-29 MED ORDER — MILRINONE LACTATE IN DEXTROSE 20-5 MG/100ML-% IV SOLN
0.1250 ug/kg/min | INTRAVENOUS | Status: DC
  Administered 2023-06-29 – 2023-07-01 (×4): 0.25 ug/kg/min via INTRAVENOUS
  Filled 2023-06-29 (×3): qty 100

## 2023-06-29 MED ORDER — LIDOCAINE HCL (PF) 1 % IJ SOLN
INTRAMUSCULAR | Status: DC
Start: 2023-06-29 — End: 2023-06-29
  Filled 2023-06-29: qty 30

## 2023-06-29 MED ORDER — LIDOCAINE HCL (PF) 1 % IJ SOLN
INTRAMUSCULAR | Status: AC
  Filled 2023-06-29: qty 30

## 2023-06-29 MED ORDER — ASPIRIN 81 MG PO CHEW: 81.0000 mg | CHEWABLE_TABLET | ORAL | Status: DC

## 2023-06-29 MED ORDER — ATROPINE SULFATE 1 MG/10ML IJ SOSY
1.0000 mg | PREFILLED_SYRINGE | Freq: Once | INTRAMUSCULAR | Status: AC
  Administered 2023-06-29: 1 mg via INTRAVENOUS

## 2023-06-29 MED ORDER — FENTANYL CITRATE (PF) 100 MCG/2ML IJ SOLN
INTRAMUSCULAR | Status: AC
  Filled 2023-06-29: qty 2

## 2023-06-29 MED ORDER — EPINEPHRINE 1 MG/10ML IJ SOSY
PREFILLED_SYRINGE | INTRAMUSCULAR | Status: AC
  Filled 2023-06-29: qty 10

## 2023-06-29 MED ORDER — AMIODARONE HCL IN DEXTROSE 360-4.14 MG/200ML-% IV SOLN
30.0000 mg/h | INTRAVENOUS | Status: DC
  Administered 2023-06-29 – 2023-06-30 (×3): 30 mg/h via INTRAVENOUS
  Filled 2023-06-29 (×3): qty 200

## 2023-06-29 MED ORDER — NITROGLYCERIN 0.4 MG SL SUBL: 0.4000 mg | SUBLINGUAL_TABLET | SUBLINGUAL | Status: DC | PRN

## 2023-06-29 MED ORDER — POTASSIUM CHLORIDE CRYS ER 20 MEQ PO TBCR
60.0000 meq | EXTENDED_RELEASE_TABLET | Freq: Once | ORAL | Status: AC
  Administered 2023-06-29: 60 meq via ORAL
  Filled 2023-06-29: qty 3

## 2023-06-29 MED ORDER — PIPERACILLIN-TAZOBACTAM 3.375 G IVPB
3.3750 g | Freq: Three times a day (TID) | INTRAVENOUS | Status: AC
  Administered 2023-06-29 – 2023-06-30 (×4): 3.375 g via INTRAVENOUS
  Filled 2023-06-29 (×4): qty 50

## 2023-06-29 MED ORDER — NITROGLYCERIN 1 MG/10 ML FOR IR/CATH LAB
INTRA_ARTERIAL | Status: DC | PRN
  Administered 2023-06-29: 200 ug

## 2023-06-29 MED ORDER — VASOPRESSIN 20 UNITS/100 ML INFUSION FOR SHOCK
INTRAVENOUS | Status: AC
  Filled 2023-06-29: qty 100

## 2023-06-29 MED ORDER — FENTANYL CITRATE PF 50 MCG/ML IJ SOSY
PREFILLED_SYRINGE | INTRAMUSCULAR | Status: AC
  Filled 2023-06-29: qty 2

## 2023-06-29 MED ORDER — SODIUM CHLORIDE 0.9 % WEIGHT BASED INFUSION
3.0000 mL/kg/h | INTRAVENOUS | Status: AC
  Administered 2023-06-29: 0.103 mL/kg/h via INTRAVENOUS

## 2023-06-29 MED ORDER — LIDOCAINE HCL (PF) 1 % IJ SOLN
INTRAMUSCULAR | Status: DC | PRN
  Administered 2023-06-29: 2 mL

## 2023-06-29 MED ORDER — ALLOPURINOL 300 MG PO TABS
300.0000 mg | ORAL_TABLET | Freq: Every day | ORAL | Status: DC
  Administered 2023-06-29 – 2023-07-04 (×6): 300 mg via ORAL
  Filled 2023-06-29 (×6): qty 1

## 2023-06-29 MED ORDER — FENTANYL CITRATE (PF) 100 MCG/2ML IJ SOLN
INTRAMUSCULAR | Status: DC | PRN
Start: 2023-06-29 — End: 2023-06-29
  Administered 2023-06-29: 50 ug via INTRAVENOUS

## 2023-06-29 MED ORDER — ROCURONIUM BROMIDE 10 MG/ML (PF) SYRINGE
PREFILLED_SYRINGE | INTRAVENOUS | Status: AC
  Filled 2023-06-29: qty 10

## 2023-06-29 MED ORDER — FENTANYL CITRATE PF 50 MCG/ML IJ SOSY
PREFILLED_SYRINGE | INTRAMUSCULAR | Status: AC
  Administered 2023-06-29: 25 ug
  Filled 2023-06-29: qty 1

## 2023-06-29 MED ORDER — LIDOCAINE HCL (PF) 1 % IJ SOLN
INTRAMUSCULAR | Status: DC | PRN
  Administered 2023-06-29: 2 mL
  Administered 2023-06-29 (×2): 10 mL

## 2023-06-29 MED ORDER — ASPIRIN 81 MG PO TBEC: 81.0000 mg | DELAYED_RELEASE_TABLET | Freq: Every day | ORAL | Status: DC

## 2023-06-29 MED ORDER — SODIUM CHLORIDE 0.9% FLUSH
3.0000 mL | Freq: Two times a day (BID) | INTRAVENOUS | Status: DC
  Administered 2023-06-29 – 2023-07-03 (×9): 3 mL via INTRAVENOUS

## 2023-06-29 MED ORDER — NITROGLYCERIN 1 MG/10 ML FOR IR/CATH LAB
INTRA_ARTERIAL | Status: AC
  Filled 2023-06-29: qty 10

## 2023-06-29 MED ORDER — IOHEXOL 350 MG/ML SOLN
INTRAVENOUS | Status: DC | PRN
  Administered 2023-06-29: 210 mL

## 2023-06-29 MED ORDER — AMIODARONE HCL IN DEXTROSE 360-4.14 MG/200ML-% IV SOLN
INTRAVENOUS | Status: AC
  Filled 2023-06-29: qty 200

## 2023-06-29 MED ORDER — MIDAZOLAM HCL 2 MG/2ML IJ SOLN
INTRAMUSCULAR | Status: DC | PRN
  Administered 2023-06-29: 1 mg via INTRAVENOUS

## 2023-06-29 MED ORDER — OXYCODONE HCL 5 MG PO TABS
10.0000 mg | ORAL_TABLET | Freq: Once | ORAL | Status: AC
  Administered 2023-06-29: 10 mg via ORAL
  Filled 2023-06-29: qty 2

## 2023-06-29 MED ORDER — ACETAMINOPHEN 325 MG PO TABS: 650.0000 mg | ORAL_TABLET | ORAL | Status: DC | PRN

## 2023-06-29 MED ORDER — FENTANYL CITRATE PF 50 MCG/ML IJ SOSY
25.0000 ug | PREFILLED_SYRINGE | Freq: Once | INTRAMUSCULAR | Status: DC
  Filled 2023-06-29: qty 1

## 2023-06-29 MED ORDER — FENTANYL CITRATE (PF) 100 MCG/2ML IJ SOLN
INTRAMUSCULAR | Status: DC | PRN
  Administered 2023-06-29 (×2): 50 ug via INTRAVENOUS

## 2023-06-29 MED ORDER — SODIUM CHLORIDE 0.9 % IV SOLN: INTRAVENOUS | Status: AC | PRN

## 2023-06-29 MED ORDER — PANTOPRAZOLE SODIUM 40 MG PO TBEC
40.0000 mg | DELAYED_RELEASE_TABLET | Freq: Every day | ORAL | Status: DC
  Administered 2023-06-30 – 2023-07-04 (×5): 40 mg via ORAL
  Filled 2023-06-29 (×5): qty 1

## 2023-06-29 MED ORDER — SODIUM CHLORIDE 0.9 % IV SOLN: INTRAVENOUS | Status: AC

## 2023-06-29 MED ORDER — ROSUVASTATIN CALCIUM 20 MG PO TABS
20.0000 mg | ORAL_TABLET | Freq: Every day | ORAL | Status: DC
  Administered 2023-06-29 – 2023-07-04 (×6): 20 mg via ORAL
  Filled 2023-06-29 (×6): qty 1

## 2023-06-29 MED ORDER — ACETAMINOPHEN 325 MG PO TABS
650.0000 mg | ORAL_TABLET | ORAL | Status: DC | PRN
  Administered 2023-06-29 – 2023-07-01 (×5): 650 mg via ORAL
  Filled 2023-06-29 (×5): qty 2

## 2023-06-29 MED ORDER — KETAMINE HCL 50 MG/5ML IJ SOSY
PREFILLED_SYRINGE | INTRAMUSCULAR | Status: AC
  Filled 2023-06-29: qty 10

## 2023-06-29 MED ORDER — VERAPAMIL HCL 2.5 MG/ML IV SOLN
INTRAVENOUS | Status: DC | PRN
  Administered 2023-06-29: 10 mL via INTRA_ARTERIAL

## 2023-06-29 MED ORDER — NOREPINEPHRINE 4 MG/250ML-% IV SOLN
0.0000 ug/min | INTRAVENOUS | Status: DC
  Administered 2023-06-29: 16 ug/min via INTRAVENOUS
  Administered 2023-06-29: 20 ug/min via INTRAVENOUS
  Administered 2023-06-30: 7 ug/min via INTRAVENOUS
  Administered 2023-06-30: 8 ug/min via INTRAVENOUS
  Administered 2023-07-01: 4 ug/min via INTRAVENOUS
  Filled 2023-06-29 (×6): qty 250

## 2023-06-29 MED ORDER — ETOMIDATE 2 MG/ML IV SOLN
INTRAVENOUS | Status: AC
  Filled 2023-06-29: qty 20

## 2023-06-29 MED ORDER — FUROSEMIDE 10 MG/ML IJ SOLN
80.0000 mg | Freq: Two times a day (BID) | INTRAMUSCULAR | Status: DC
  Administered 2023-06-29 – 2023-07-01 (×5): 80 mg via INTRAVENOUS
  Filled 2023-06-29 (×5): qty 8

## 2023-06-29 MED ORDER — HEPARIN (PORCINE) IN NACL 1000-0.9 UT/500ML-% IV SOLN
INTRAVENOUS | Status: DC | PRN
  Administered 2023-06-29 (×2): 500 mL

## 2023-06-29 MED ORDER — OXYCODONE HCL 5 MG PO TABS
5.0000 mg | ORAL_TABLET | ORAL | Status: DC | PRN
  Administered 2023-06-30 (×5): 5 mg via ORAL
  Filled 2023-06-29 (×5): qty 1

## 2023-06-29 MED ORDER — HEPARIN SODIUM (PORCINE) 1000 UNIT/ML IJ SOLN
INTRAMUSCULAR | Status: DC | PRN
  Administered 2023-06-29: 2000 [IU] via INTRAVENOUS
  Administered 2023-06-29: 9000 [IU] via INTRAVENOUS
  Administered 2023-06-29: 3000 [IU] via INTRAVENOUS

## 2023-06-29 MED ORDER — MIDAZOLAM HCL 2 MG/2ML IJ SOLN
INTRAMUSCULAR | Status: DC | PRN
  Administered 2023-06-29: 2 mg via INTRAVENOUS

## 2023-06-29 MED ORDER — MILRINONE LACTATE IN DEXTROSE 20-5 MG/100ML-% IV SOLN
INTRAVENOUS | Status: AC
  Filled 2023-06-29: qty 100

## 2023-06-29 MED ORDER — EPINEPHRINE HCL 5 MG/250ML IV SOLN IN NS
0.5000 ug/min | INTRAVENOUS | Status: DC
  Administered 2023-06-29: 5 ug/min via INTRAVENOUS
  Administered 2023-06-29: 0.5 ug/min via INTRAVENOUS
  Filled 2023-06-29: qty 250

## 2023-06-29 MED ORDER — EZETIMIBE 10 MG PO TABS
10.0000 mg | ORAL_TABLET | Freq: Every day | ORAL | Status: DC
  Administered 2023-06-29 – 2023-07-04 (×6): 10 mg via ORAL
  Filled 2023-06-29 (×6): qty 1

## 2023-06-29 MED ORDER — CHLORHEXIDINE GLUCONATE CLOTH 2 % EX PADS
6.0000 | MEDICATED_PAD | Freq: Every day | CUTANEOUS | Status: DC
  Administered 2023-06-30 – 2023-07-03 (×4): 6 via TOPICAL

## 2023-06-29 MED ORDER — BUPROPION HCL ER (XL) 150 MG PO TB24
150.0000 mg | ORAL_TABLET | Freq: Every morning | ORAL | Status: DC
  Administered 2023-06-30 – 2023-07-04 (×5): 150 mg via ORAL
  Filled 2023-06-29 (×5): qty 1

## 2023-06-29 MED ORDER — BUDESONIDE 0.5 MG/2ML IN SUSP
0.5000 mg | Freq: Two times a day (BID) | RESPIRATORY_TRACT | Status: DC
  Administered 2023-06-29 – 2023-07-04 (×9): 0.5 mg via RESPIRATORY_TRACT
  Filled 2023-06-29 (×9): qty 2

## 2023-06-29 MED ORDER — SODIUM CHLORIDE 0.9 % WEIGHT BASED INFUSION: 1.0000 mL/kg/h | INTRAVENOUS | Status: DC

## 2023-06-29 MED ORDER — PHENYLEPHRINE 80 MCG/ML (10ML) SYRINGE FOR IV PUSH (FOR BLOOD PRESSURE SUPPORT)
PREFILLED_SYRINGE | INTRAVENOUS | Status: AC
  Filled 2023-06-29: qty 10

## 2023-06-29 MED ORDER — VIPERSLIDE LUBRICANT OPTIME: TOPICAL | Status: DC | PRN

## 2023-06-29 MED ORDER — CLOPIDOGREL BISULFATE 75 MG PO TABS
75.0000 mg | ORAL_TABLET | ORAL | Status: DC
Start: 2023-06-29 — End: 2023-06-29

## 2023-06-29 SURGICAL SUPPLY — 21 items
BALLOON EMERGE MR 2.5X30 (BALLOONS) IMPLANT
BALLOON SAPPHIRE 2.5X20 (BALLOONS) IMPLANT
BALLOON SPRINTER MX OTW 1.5X12 (BALLOONS) IMPLANT
CATH TELEPORT (CATHETERS) IMPLANT
CATH VISTA GUIDE 6FR XB4 MULPK (CATHETERS) IMPLANT
CROWN DIAMONDBACK CLASSIC 1.25 (BURR) IMPLANT
DEVICE RAD COMP TR BAND LRG (VASCULAR PRODUCTS) IMPLANT
ELECT DEFIB PAD ADLT CADENCE (PAD) IMPLANT
GLIDESHEATH SLEND A-KIT 6F 22G (SHEATH) IMPLANT
GUIDEWIRE INQWIRE 1.5J.035X260 (WIRE) IMPLANT
KIT ENCORE 26 ADVANTAGE (KITS) IMPLANT
KIT HEMO VALVE WATCHDOG (MISCELLANEOUS) IMPLANT
LUBRICANT VIPERSLIDE CORONARY (MISCELLANEOUS) IMPLANT
PACK CARDIAC CATHETERIZATION (CUSTOM PROCEDURE TRAY) ×1 IMPLANT
SET ATX-X65L (MISCELLANEOUS) IMPLANT
STENT SYNERGY XD 2.75X48 (Permanent Stent) IMPLANT
WIRE HI TORQ WHISPER MS 190CM (WIRE) IMPLANT
WIRE RUNTHROUGH .014X180CM (WIRE) IMPLANT
WIRE RUNTHROUGH EXTENSION (WIRE) IMPLANT
WIRE RUNTHROUGH IZANAI 014 180 (WIRE) IMPLANT
WIRE VIPERWIRE COR FLEX .012 (WIRE) IMPLANT

## 2023-06-29 SURGICAL SUPPLY — 4 items
SHEATH PROBE COVER 6X72 (BAG) IMPLANT
TRAY PERICARDIOCENTESIS 6FX60 (TRAY / TRAY PROCEDURE) IMPLANT
WIRE MICRO SET 5FR 12 (WIRE) IMPLANT
WIRE MICRO SET SILHO 5FR 7 (SHEATH) IMPLANT

## 2023-06-29 NOTE — Progress Notes (Signed)
 Client c/o nausea and states can't take a deep breath ; Dr Ladona notified by another RN

## 2023-06-29 NOTE — Progress Notes (Signed)
 Documenting Epi titrations for Hella, RN

## 2023-06-29 NOTE — Progress Notes (Signed)
 CARDIAC REHAB PHASE I     Post stent education including site care, restrictions, risk factors, exercise guidelines, NTG use, antiplatelet therapy importance, heart healthy diet and CRP2 reviewed. All questions and concerns addressed. Will refer to [facility] for CRP2. Plan for home later today.    [id number] Vaughn Asberry Hacking, RN BSN 06/29/2023 2:15 PM

## 2023-06-29 NOTE — Progress Notes (Signed)
  Echocardiogram 2D Echocardiogram has been performed.  Jose Hines 06/29/2023, 3:44 PM

## 2023-06-29 NOTE — Significant Event (Signed)
 Rapid Response Event Note   Reason for Call :  hypotension  Initial Focused Assessment:  Patient states he feels terrible.  He is cold and clammy lying flat in the bed.  Lung sounds clear. Heart tones muffled.  BP 49/36  HR 78  RR 18  O2 sat 93% on 2L [place]  Dr Ladona at bedside  Interventions:  250 NS bolus Epinephrine gtt started at 5mcg/min Titrated 1mcg every 2 minutes order to increase Epi gtt to 10mcg/min Continue nitrating Epi gtt every 2 min to 15mcg/min  Transported to 2H17  CCU staff at bedside to receive patient.  BP  93/78  Hand off with CCU staff.  Plan of Care:     Event Summary:   MD Notified:  Call Time: 1350 Arrival Time: 1352 End Time: 1430  Elvin Portland, RN

## 2023-06-29 NOTE — Progress Notes (Signed)
 Dr Ladona in room and ordered 0.5 mg of atropine and given and 15 min later ordered rest of atropine and given; heart rate 60's and 70's and b/p remains low; see flowsheets; Epi drip ordered and rapid response nurse Hella, RN in ; client to be transferred to [facility]; client's wife in room; report called to Verneita, RN 2H and Dr Ladona ordered portable, stat cxr; Dr Gardenia in and client transferred via bed on heart monitor with Dr Gardenia , Hella,RN and I

## 2023-06-29 NOTE — Interval H&P Note (Signed)
 History and Physical Interval Note:  06/29/2023 9:30 AM  Jose Hines.  has presented today for surgery, with the diagnosis of cad.  The various methods of treatment have been discussed with the patient and family. After consideration of risks, benefits and other options for treatment, the patient has consented to  Procedure(s): CORONARY STENT INTERVENTION (N/A) as a surgical intervention.  The patient's history has been reviewed, patient examined, no change in status, stable for surgery.  I have reviewed the patient's chart and labs.  Questions were answered to the patient's satisfaction.     Gordy Bergamo

## 2023-06-29 NOTE — Progress Notes (Signed)
 Client c/o feeling hot, nausea, painful to take a breath; 12 lead EKG; O2 at 2l/min; client placed in trendelenburg; given250cc bolus saline as ordered by Dr Ladona

## 2023-06-29 NOTE — Consult Note (Signed)
 Advanced Heart Failure Team Consult Note   Primary Physician: Seabron Lenis, MD Cardiologist:  Gordy Bergamo, MD HF MD: Dr Carola  Reason for Consultation: Cardiogenic Shock   HPI:    Jose Hines. is seen today for evaluation of Cardiogenic Shock at the request of Dr Margaretann.   Mr Mohler is a 78 year old with a history  chronic HFrEF, ICM, CAD  CTO RCA/high grade proxima LAD, pulmonary fibrosis, COPD, and severe MR   TEE 05/18/2023 that was significant for EF of 20%, moderate to severe mitral regurgitation and reduced RV function.   He was here today for outpatient PCI LAD. In recovery he developed sudden dyspnea, hypotension and AMS. Maps down to the 40s. Started on Epi and admitted to [facility]. On arrival Maps soft. Frequent ectopy and NSVT.   Stat echo with pericardial effusion, RV compressed.  Hemodynamic instability.    Home Medications Prior to Admission medications   Medication Sig Start Date End Date Taking? Authorizing Provider  albuterol  (PROVENTIL  HFA;VENTOLIN  HFA) 108 (90 Base) MCG/ACT inhaler Inhale 1-2 puffs into the lungs every 6 (six) hours as needed for wheezing or shortness of breath. 12/24/16  Yes Midge Golas, MD  albuterol  (PROVENTIL ) (2.5 MG/3ML) 0.083% nebulizer solution Take 3 mLs (2.5 mg total) by nebulization every 6 (six) hours as needed for wheezing or shortness of breath. 08/02/18  Yes Olalere, Adewale A, MD  allopurinol  (ZYLOPRIM ) 300 MG tablet Take 300 mg by mouth daily.   Yes [provider]  aspirin  81 MG tablet Take 81 mg by mouth daily.   Yes [provider]  budesonide  (PULMICORT ) 0.5 MG/2ML nebulizer solution INHALE 2 MILLILITERS VIA NEBULIZATION BY MOUTH 2 TIMES A DAY 06/22/23  Yes Olalere, Adewale A, MD  buPROPion  (WELLBUTRIN  XL) 150 MG 24 hr tablet Take 150 mg by mouth every morning.   Yes [provider]  clopidogrel  (PLAVIX ) 75 MG tablet Take 1 tablet (75 mg total) by mouth daily. 06/23/23  Yes Bergamo Gordy, MD   ezetimibe  (ZETIA ) 10 MG tablet Take 10 mg by mouth daily.   Yes [provider]  furosemide  (LASIX ) 20 MG tablet Take 1 tablet (20 mg total) by mouth daily. 04/29/23  Yes Thukkani, Arun K, MD  losartan  (COZAAR ) 25 MG tablet Take 1 tablet (25 mg total) by mouth daily. 06/14/23  Yes Sabharwal, Aditya, DO  metoprolol  succinate (TOPROL  XL) 25 MG 24 hr tablet Take 0.5 tablets (12.5 mg total) by mouth daily. 04/29/23  Yes Thukkani, Arun K, MD  pantoprazole  (PROTONIX ) 40 MG tablet Take 1 tablet (40 mg total) by mouth daily before breakfast. 06/23/23  Yes Bergamo Gordy, MD  Pirfenidone  267 MG TABS Take 3 tablets (801 mg total) by mouth 3 (three) times daily with meals. 02/25/23  Yes Ramaswamy, Murali, MD  polyethylene glycol (MIRALAX  / GLYCOLAX ) 17 g packet Take 17 g by mouth daily as needed for mild constipation. 04/25/23  Yes Rosario Leatrice FERNS, MD  rosuvastatin  (CRESTOR ) 20 MG tablet Take 20 mg by mouth daily. 03/23/23  Yes Bergamo Gordy, MD    Past Medical History: 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   Alcoholic polyneuropathy (HCC)    Allergy    Asthma    BPH (benign prostatic hyperplasia)    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 failure) (HCC) 04/23/2023   COPD (chronic obstructive pulmonary disease) (HCC)    Depression    Depression    Diverticulosis  Dyspnea on exertion    Elevated LDL cholesterol level    GERD with stricture    Gout    Hiatal hernia    Hypertension    IPF (idiopathic pulmonary fibrosis) (HCC) 04/23/2023   Kidney stones    2 times - passed stones, no surgery required   Lumbar and sacral osteoarthritis    Mitral regurgitation 04/23/2023   OSA on CPAP 04/23/2023   Peripheral polyneuropathy    Sleep apnea    wears CPAP   Wears hearing aid    Bil    Past Surgical History: Past Surgical History:  Procedure Laterality Date   COLONOSCOPY     INGUINAL HERNIA REPAIR     POLYPECTOMY     RIGHT/LEFT HEART  CATH AND CORONARY ANGIOGRAPHY N/A 11/19/2022   Procedure: RIGHT/LEFT HEART CATH AND CORONARY ANGIOGRAPHY;  Surgeon: Verlin Lonni BIRCH, MD;  Location: [facility] INVASIVE CV LAB;  Service: Cardiovascular;  Laterality: N/A;   TONSILLECTOMY AND ADENOIDECTOMY     TRANSESOPHAGEAL ECHOCARDIOGRAM (CATH LAB) N/A 05/18/2023   Procedure: TRANSESOPHAGEAL ECHOCARDIOGRAM;  Surgeon: Francyne Headland, MD;  Location: [facility] INVASIVE CV LAB;  Service: Cardiovascular;  Laterality: N/A;    Family History: Family History  Problem Relation Age of Onset   Transient ischemic attack Mother    Heart disease Father 46   Other Son        car wreck   Colon cancer Neg Hx    Esophageal cancer Neg Hx    Rectal cancer Neg Hx    Stomach cancer Neg Hx     Social History: Social History   Socioeconomic History   Marital status: Married    Spouse name: Not on file   Number of children: 2   Years of education: Not on file   Highest education level: Not on file  Occupational History   Occupation: Engineer, site  Tobacco Use   Smoking status: Former    Current packs/day: 0.00    Average packs/day: 2.0 packs/day for 10.0 years (20.0 ttl pk-yrs)    Types: Cigarettes    Start date: 12/29/1958    Quit date: 12/28/1968    Years since quitting: 54.5   Smokeless tobacco: Never  Vaping Use   Vaping status: Never Used  Substance and Sexual Activity   Alcohol use: Yes    Alcohol/week: 7.0 - 14.0 standard drinks of alcohol    Types: 7 - 14 Standard drinks or equivalent per week    Comment: bourbon   Drug use: No   Sexual activity: Not on file  Other Topics Concern   Not on file  Social History Narrative   Not on file   Social Drivers of Health   Financial Resource Strain: Not on file  Food Insecurity: No Food Insecurity (04/23/2023)   Hunger Vital Sign    Worried About Running Out of Food in the Last Year: Never true    Ran Out of Food in the Last Year: Never true  Transportation Needs: No Transportation Needs  (04/23/2023)   PRAPARE - Administrator, Civil Service (Medical): No    Lack of Transportation (Non-Medical): No  Physical Activity: Not on file  Stress: Not on file  Social Connections: Moderately Isolated (04/23/2023)   Social Connection and Isolation Panel    Frequency of Communication with Friends and Family: More than three times a week    Frequency of Social Gatherings with Friends and Family: More than three times a week    Attends Religious Services:  Never    Active Member of Clubs or Organizations: No    Attends Banker Meetings: Never    Marital Status: Married    Allergies:  No Known Allergies  Objective:    Vital Signs:   Temp:  [97.9 F (36.6 C)] 97.9 F (36.6 C) (06/24 0715) Pulse Rate:  [0-92] 74 (06/24 1348) Resp:  [14-24] 22 (06/24 1112) BP: (53-135)/(27-92) 53/27 (06/24 1348) SpO2:  [91 %-98 %] 95 % (06/24 1348) Weight:  [98 kg] 98 kg (06/24 0715)    Weight change: Filed Weights   06/29/23 0715  Weight: 98 kg    Intake/Output:  No intake or output data in the 24 hours ending 06/29/23 1420    Physical Exam   General: Weak. Acute Dyspnea.  Neck: supple. JVP difficult ot assess   Cor: PMI nondisplaced. Regular rate & rhythm. No rubs, gallops or murmurs. Lungs: clear Abdomen: soft, nontender, nondistended.  Extremities: no cyanosis, clubbing, rash, edema Neuro: alert & oriented x3   Telemetry   SR with PVC/NSVT.   EKG     Labs   Basic Metabolic Panel: Recent Labs  Lab 06/23/23 1013  NA 145*  K 3.6  CL 103  CO2 23  GLUCOSE 106*  BUN 10  CREATININE 0.71*  CALCIUM  9.3    Liver Function Tests: No results for input(s): AST, ALT, ALKPHOS, BILITOT, PROT, ALBUMIN in the last 168 hours. No results for input(s): LIPASE, AMYLASE in the last 168 hours. No results for input(s): AMMONIA in the last 168 hours.  CBC: Recent Labs  Lab 06/23/23 1014  WBC 11.1*  HGB 14.4  HCT 40.9  MCV 104*  PLT  241    Cardiac Enzymes: No results for input(s): CKTOTAL, CKMB, CKMBINDEX, TROPONINI in the last 168 hours.  BNP: BNP (last 3 results) Recent Labs    04/23/23 1620 06/14/23 1200  BNP 707.9* 657.2*    ProBNP (last 3 results) Recent Labs    11/13/22 1539  PROBNP 400     CBG: No results for input(s): GLUCAP in the last 168 hours.  Coagulation Studies: No results for input(s): LABPROT, INR in the last 72 hours.   Imaging   No results found.   Medications:     Current Medications:  aspirin   81 mg Oral Pre-Cath   atropine       clopidogrel   75 mg Oral Pre-Cath   nitroGLYCERIN       sodium chloride  flush  3 mL Intravenous Q12H    Infusions:  sodium chloride      sodium chloride      sodium chloride      epinephrine 11 mcg/min (06/29/23 1408)      Patient Profile  Mr Lalani is a 78 year old with a history  chronic HFrEF, ICM, CAD  CTO RCA/high grade proxima LAD, pulmonary fibrosis, COPD, and severe MR    Cath today for LAD stent complicated pericardial effusion and cardiogenic shock.   Assessment/Plan   CAD-S/P LAD stent complicated by Pericardial Effusion, Cardiogenic Shock  Post cath developed shock. Acute Dyspnea + hypotension  Started on Epi 5 mcg. Pressure dropped. Epi increased, Vaso and Norepi added. Hemodynamic instability.   Given fluid bolus  Check lactic acid now.  Stat Echo with pericardial effusion and RV compressed/tamponade. Return to Cath lab for pericardiocentesis   NSVT Give 150 mg amio now and lidocaine  100  now.   CAD H/O CTO RCA Today he underwent PCI to prox and mid LAD  Got aspirin  +  plavix .    Pulmonary Fibrosis Followed by Dr Geronimo    Take urgently back to cath for pericardiocentesis   Check CBC, BMET, lactic acid.    Length of Stay: 0  Greig Mosses, NP  06/29/2023, 2:20 PM    Advanced Heart Failure Team Pager [phone] (M-F; 7a - 5p)  Please contact [facility] for night-coverage after hours  (4p -7a ) and weekends on [email or web address]

## 2023-06-29 NOTE — Progress Notes (Signed)
 Pharmacy Antibiotic Note  Jose Hines. is a 78 y.o. male admitted on 06/29/2023 with sepsis.  Pharmacy has been consulted for vancomycin dosing.  Underwent cardiac cath with DES to LAD complicated by pericardial effusion post-procedure requiring pericardiocentesis. WBC 21, Scr 0.93 (CrCl 78 mL/min). LA 3.   Plan: Zosyn 3.375g IV q8h (4 hour infusion). Vanc 2g IV once then 1500 mg IV every 24 hours (estAUC 491, Vd 0.5) Monitor renal fx, cx results, clinical pic, and vanc levels as appropriate  Height: 5' 11 (180.3 cm) Weight: 98 kg (216 lb) IBW/kg (Calculated) : 75.3  Temp (24hrs), Avg:98.2 F (36.8 C), Min:97.9 F (36.6 C), Max:98.4 F (36.9 C)  Recent Labs  Lab 06/23/23 1013 06/23/23 1014 06/29/23 1703 06/29/23 1709  WBC  --  11.1* 21.0*  --   CREATININE 0.71*  --  0.93  --   LATICACIDVEN  --   --   --  3.0*    Estimated Creatinine Clearance: 78.1 mL/min (by C-G formula based on SCr of 0.93 mg/dL).    No Known Allergies  Antimicrobials this admission: Vancomycin 6/24 >>  Zosyn 6/24 >>   Dose adjustments this admission: N/A  Microbiology results: 6/24 Pericardial fluid cx: pending   Thank you for allowing pharmacy to participate in this patient's care,  Suzen Sour, PharmD, BCCCP Clinical Pharmacist  Phone: [phone] 06/29/2023 9:46 PM  Please check AMION for all [facility] phone numbers After 10:00 PM, call Main Pharmacy [phone]

## 2023-06-29 NOTE — Progress Notes (Addendum)
  Taken urgently to cath lab for pericardiocentesis.   Returned from the cath lab with pericardial drain. Art line and central line in place. Set up CVP/ Check CO-OX   On norepi + vaso. Maps 80s. Sats 97% on 4 liter [place].   Moaning. Complaining of sharp back pain.   CXR with pulmonary edema.  Give 80 mg IV lasix  now.   Dr Gardenia at bedside. Stat CT chest and abdomen.  Given 25 mcg Fentanyl .  Check lactic acid,cbc,cmet, bnp, and CO-OX. May need transfusion.     Ghazi Rumpf NP-C  4:51 PM

## 2023-06-30 ENCOUNTER — Inpatient Hospital Stay (HOSPITAL_COMMUNITY)

## 2023-06-30 DIAGNOSIS — I509 Heart failure, unspecified: Secondary | ICD-10-CM

## 2023-06-30 LAB — POCT I-STAT, CHEM 8
BUN: 12 mg/dL (ref 8–23)
Calcium, Ion: 1.13 mmol/L — ABNORMAL LOW (ref 1.15–1.40)
Chloride: 98 mmol/L (ref 98–111)
Creatinine, Ser: 0.9 mg/dL (ref 0.61–1.24)
Glucose, Bld: 316 mg/dL — ABNORMAL HIGH (ref 70–99)
HCT: 39 % (ref 39.0–52.0)
Hemoglobin: 13.3 g/dL (ref 13.0–17.0)
Potassium: 3.4 mmol/L — ABNORMAL LOW (ref 3.5–5.1)
Sodium: 138 mmol/L (ref 135–145)
TCO2: 24 mmol/L (ref 22–32)

## 2023-06-30 LAB — CBC
HCT: 33.5 % — ABNORMAL LOW (ref 39.0–52.0)
Hemoglobin: 11.6 g/dL — ABNORMAL LOW (ref 13.0–17.0)
MCH: 35.2 pg — ABNORMAL HIGH (ref 26.0–34.0)
MCHC: 34.6 g/dL (ref 30.0–36.0)
MCV: 101.5 fL — ABNORMAL HIGH (ref 80.0–100.0)
Platelets: 176 10*3/uL (ref 150–400)
RBC: 3.3 MIL/uL — ABNORMAL LOW (ref 4.22–5.81)
RDW: 14.1 % (ref 11.5–15.5)
WBC: 20.6 10*3/uL — ABNORMAL HIGH (ref 4.0–10.5)
nRBC: 0 % (ref 0.0–0.2)

## 2023-06-30 LAB — BASIC METABOLIC PANEL WITH GFR
Anion gap: 10 (ref 5–15)
Anion gap: 13 (ref 5–15)
Anion gap: 8 (ref 5–15)
BUN: 11 mg/dL (ref 8–23)
BUN: 12 mg/dL (ref 8–23)
BUN: 12 mg/dL (ref 8–23)
CO2: 21 mmol/L — ABNORMAL LOW (ref 22–32)
CO2: 22 mmol/L (ref 22–32)
CO2: 29 mmol/L (ref 22–32)
Calcium: 8 mg/dL — ABNORMAL LOW (ref 8.9–10.3)
Calcium: 8.3 mg/dL — ABNORMAL LOW (ref 8.9–10.3)
Calcium: 8.3 mg/dL — ABNORMAL LOW (ref 8.9–10.3)
Chloride: 102 mmol/L (ref 98–111)
Chloride: 102 mmol/L (ref 98–111)
Chloride: 95 mmol/L — ABNORMAL LOW (ref 98–111)
Creatinine, Ser: 0.77 mg/dL (ref 0.61–1.24)
Creatinine, Ser: 0.77 mg/dL (ref 0.61–1.24)
Creatinine, Ser: 0.88 mg/dL (ref 0.61–1.24)
GFR, Estimated: >60 mL/min (ref >60)
GFR, Estimated: >60 mL/min (ref >60)
GFR, Estimated: >60 mL/min (ref >60)
Glucose, Bld: 219 mg/dL — ABNORMAL HIGH (ref 70–99)
Glucose, Bld: 226 mg/dL — ABNORMAL HIGH (ref 70–99)
Glucose, Bld: 234 mg/dL — ABNORMAL HIGH (ref 70–99)
Potassium: 3.3 mmol/L — ABNORMAL LOW (ref 3.5–5.1)
Potassium: 3.4 mmol/L — ABNORMAL LOW (ref 3.5–5.1)
Potassium: 3.6 mmol/L (ref 3.5–5.1)
Sodium: 132 mmol/L — ABNORMAL LOW (ref 135–145)
Sodium: 133 mmol/L — ABNORMAL LOW (ref 135–145)
Sodium: 137 mmol/L (ref 135–145)

## 2023-06-30 LAB — ECHOCARDIOGRAM LIMITED
Height: 71 in
S' Lateral: 6 cm
Weight: 3474.45 [oz_av]

## 2023-06-30 LAB — GLUCOSE, CAPILLARY: Glucose-Capillary: 139 mg/dL — ABNORMAL HIGH (ref 70–99)

## 2023-06-30 LAB — COOXEMETRY PANEL
Carboxyhemoglobin: 2 % — ABNORMAL HIGH (ref 0.5–1.5)
Methemoglobin: <0.7 % (ref 0.0–1.5)
O2 Saturation: 60.9 %
Total hemoglobin: 11.5 g/dL — ABNORMAL LOW (ref 12.0–16.0)

## 2023-06-30 LAB — PROTEIN, BODY FLUID (OTHER): Total Protein, Body Fluid Other: 17.4 g/dL

## 2023-06-30 LAB — GLUCOSE, BODY FLUID OTHER: Glucose, Body Fluid Other: 68 mg/dL

## 2023-06-30 LAB — PROCALCITONIN: Procalcitonin: 0.17 ng/mL

## 2023-06-30 LAB — MRSA NEXT GEN BY PCR, NASAL: MRSA by PCR Next Gen: NOT DETECTED

## 2023-06-30 LAB — MAGNESIUM
Magnesium: 1.3 mg/dL — ABNORMAL LOW (ref 1.7–2.4)
Magnesium: 1.4 mg/dL — ABNORMAL LOW (ref 1.7–2.4)

## 2023-06-30 LAB — LACTIC ACID, PLASMA: Lactic Acid, Venous: 1.4 mmol/L (ref 0.5–1.9)

## 2023-06-30 MED ORDER — POTASSIUM CHLORIDE CRYS ER 20 MEQ PO TBCR
40.0000 meq | EXTENDED_RELEASE_TABLET | Freq: Once | ORAL | Status: AC
  Administered 2023-06-30: 40 meq via ORAL
  Filled 2023-06-30: qty 2

## 2023-06-30 MED ORDER — PIRFENIDONE 267 MG PO TABS
801.0000 mg | ORAL_TABLET | Freq: Three times a day (TID) | ORAL | Status: DC
  Administered 2023-06-30 – 2023-07-04 (×12): 801 mg via ORAL
  Filled 2023-06-30 (×15): qty 3

## 2023-06-30 MED ORDER — INSULIN ASPART 100 UNIT/ML IJ SOLN
0.0000 [IU] | Freq: Three times a day (TID) | INTRAMUSCULAR | Status: DC
  Administered 2023-06-30 (×2): 8 [IU] via SUBCUTANEOUS
  Administered 2023-07-01: 5 [IU] via SUBCUTANEOUS
  Administered 2023-07-01: 2 [IU] via SUBCUTANEOUS
  Administered 2023-07-01 – 2023-07-02 (×2): 3 [IU] via SUBCUTANEOUS
  Administered 2023-07-02: 2 [IU] via SUBCUTANEOUS
  Administered 2023-07-03: 5 [IU] via SUBCUTANEOUS
  Administered 2023-07-03: 2 [IU] via SUBCUTANEOUS

## 2023-06-30 MED ORDER — COLCHICINE 0.6 MG PO TABS
0.6000 mg | ORAL_TABLET | Freq: Every day | ORAL | Status: DC
  Administered 2023-06-30 – 2023-07-01 (×2): 0.6 mg via ORAL
  Filled 2023-06-30 (×3): qty 1

## 2023-06-30 MED ORDER — ALTEPLASE 2 MG IJ SOLR
2.0000 mg | Freq: Once | INTRAMUSCULAR | Status: DC
  Filled 2023-06-30: qty 2

## 2023-06-30 MED ORDER — PIRFENIDONE 267 MG PO TABS
801.0000 mg | ORAL_TABLET | Freq: Three times a day (TID) | ORAL | Status: DC
  Filled 2023-06-30 (×2): qty 3

## 2023-06-30 MED ORDER — MAGNESIUM SULFATE 4 GM/100ML IV SOLN
4.0000 g | Freq: Once | INTRAVENOUS | Status: AC
  Administered 2023-06-30: 4 g via INTRAVENOUS
  Filled 2023-06-30: qty 100

## 2023-06-30 MED ORDER — ALTEPLASE 2 MG IJ SOLR: 2.0000 mg | Freq: Once | INTRAMUSCULAR | Status: DC

## 2023-06-30 MED ORDER — PERFLUTREN LIPID MICROSPHERE
1.0000 mL | INTRAVENOUS | Status: AC | PRN
  Administered 2023-06-30: 2 mL via INTRAVENOUS

## 2023-06-30 MED ORDER — FENTANYL CITRATE PF 50 MCG/ML IJ SOSY
25.0000 ug | PREFILLED_SYRINGE | Freq: Once | INTRAMUSCULAR | Status: AC
  Administered 2023-06-30: 25 ug via INTRAVENOUS

## 2023-06-30 MED ORDER — MEXILETINE HCL 150 MG PO CAPS
150.0000 mg | ORAL_CAPSULE | Freq: Three times a day (TID) | ORAL | Status: DC
  Administered 2023-06-30 – 2023-07-04 (×12): 150 mg via ORAL
  Filled 2023-06-30 (×12): qty 1

## 2023-06-30 MED ORDER — INSULIN ASPART 100 UNIT/ML IJ SOLN: 0.0000 [IU] | Freq: Every day | INTRAMUSCULAR | Status: DC

## 2023-06-30 NOTE — Progress Notes (Signed)
   Pericardial drain removed with Dr Gardenia. Premedicated with 25 mcg IV Fentanyl . Continue IV antibiotics today.   No complications.   Add Colchicine 0.6 mg daily.   Stop IV amio and start mexitil 150 mg twice a day to suppress PVCs.   Eben Choinski NP-C  2:51 PM

## 2023-06-30 NOTE — Progress Notes (Signed)
 06/30/2023 Assisted with management during emergent pericardial drain placement.  Fortunately tolerated procedure well.  Rolan Sharps MD PCCM

## 2023-06-30 NOTE — Progress Notes (Signed)
 Advanced Heart Failure Rounding Note  Cardiologist: Gordy Bergamo, MD  Chief Complaint:  Subjective:    - No acute events overnight.  - Complaining of chest pain.   Objective:   Weight Range: 98.5 kg Body mass index is 30.29 kg/m.   Vital Signs:   Temp:  [97.4 F (36.3 C)-98.4 F (36.9 C)] 97.4 F (36.3 C) (06/25 0700) Pulse Rate:  [0-97] 71 (06/25 0700) Resp:  [14-37] 28 (06/25 0700) BP: (49-147)/(27-118) 117/77 (06/25 0700) SpO2:  [89 %-100 %] 90 % (06/25 0700) Arterial Line BP: (92-150)/(53-92) 115/69 (06/25 0700) Weight:  [98.5 kg] 98.5 kg (06/25 0500) Last BM Date :  (PTA)  Weight change: Filed Weights   06/29/23 0715 06/30/23 0500  Weight: 98 kg 98.5 kg    Intake/Output:   Intake/Output Summary (Last 24 hours) at 06/30/2023 0850 Last data filed at 06/30/2023 0700 Gross per 24 hour  Intake 1644.88 ml  Output 1225 ml  Net 419.88 ml      Physical Exam    General:  Well appearing. No resp difficulty HEENT: Normal Neck: Supple. JVP . Carotids 2+ bilat; no bruits. No lymphadenopathy or thyromegaly appreciated. Cor: PMI nondisplaced. Regular rate & rhythm. No rubs, gallops or murmurs. Lungs: Clear Abdomen: Soft, nontender, nondistended. No hepatosplenomegaly. No bruits or masses. Good bowel sounds. Extremities: No cyanosis, clubbing, rash, edema Neuro: Alert & orientedx3, cranial nerves grossly intact. moves all 4 extremities w/o difficulty. Affect pleasant   Telemetry   NSR  EKG    N/A  Labs    CBC Recent Labs    06/29/23 2333 06/30/23 0517  WBC 26.6* 20.6*  HGB 12.2* 11.6*  HCT 35.7* 33.5*  MCV 102.9* 101.5*  PLT 196 176   Basic Metabolic Panel Recent Labs    93/75/74 2333 06/30/23 0517  NA 137 133*  K 3.6 3.4*  CL 102 102  CO2 22 21*  GLUCOSE 219* 234*  BUN 12 12  CREATININE 0.88 0.77  CALCIUM  8.3* 8.0*  MG 1.3* 1.4*   Liver Function Tests Recent Labs    06/29/23 1703  AST 28  ALT 16  ALKPHOS 74  BILITOT 1.0  PROT  5.8*  ALBUMIN 3.1*   No results for input(s): LIPASE, AMYLASE in the last 72 hours. Cardiac Enzymes No results for input(s): CKTOTAL, CKMB, CKMBINDEX, TROPONINI in the last 72 hours.  BNP: BNP (last 3 results) Recent Labs    04/23/23 1620 06/14/23 1200 06/29/23 1703  BNP 707.9* 657.2* 575.2*    ProBNP (last 3 results) Recent Labs    11/13/22 1539  PROBNP 400     D-Dimer No results for input(s): DDIMER in the last 72 hours. Hemoglobin A1C No results for input(s): HGBA1C in the last 72 hours. Fasting Lipid Panel No results for input(s): CHOL, HDL, LDLCALC, TRIG, CHOLHDL, LDLDIRECT in the last 72 hours. Thyroid  Function Tests No results for input(s): TSH, T4TOTAL, T3FREE, THYROIDAB in the last 72 hours.  Invalid input(s): FREET3  Other results:   Imaging    CT CHEST ABDOMEN PELVIS WO CONTRAST Result Date: 06/29/2023 CLINICAL DATA:  Back pain. Status post pericardiocentesis today. Clinical concern for hematoma. EXAM: CT CHEST, ABDOMEN AND PELVIS WITHOUT CONTRAST TECHNIQUE: Multidetector CT imaging of the chest, abdomen and pelvis was performed following the standard protocol without IV contrast. RADIATION DOSE REDUCTION: This exam was performed according to the departmental dose-optimization program which includes automated exposure control, adjustment of the mA and/or kV according to patient size and/or use of iterative  reconstruction technique. COMPARISON:  High-resolution chest CT dated 12/31/2022. FINDINGS: CT CHEST FINDINGS Cardiovascular: Atheromatous calcifications, including the coronary arteries and aorta with extensive, dense coronary artery calcifications. Interval enlarged heart. Interval percutaneous small caliber pericardial catheter within a tiny amount of pericardial fluid and extending inferiorly with its tip at the level of the inferior aspect of the left atrium on the left, anterior to the descending thoracic aorta. Right  jugular catheter with its tip in the region of the origin of the superior vena cava. Mediastinum/Nodes: Normal-sized, calcified right hilar lymph nodes. Unremarkable thyroid  gland, trachea and esophagus. Lungs/Pleura: Interval moderate-sized right pleural effusion and small left pleural effusion measuring water density. Interval patchy atelectasis and possible pneumonia in both lower lobes and to a lesser extent in the upper lobes, right middle lobe and lingula with multifocal, somewhat nodular appearances. Right upper lobe calcified granuloma. Musculoskeletal: Mild thoracic spine degenerative changes. CT ABDOMEN PELVIS FINDINGS Hepatobiliary: Excreted contrast in the gallbladder. Normal-appearing liver. Pancreas: Unremarkable. No pancreatic ductal dilatation or surrounding inflammatory changes. Spleen: Normal in size without focal abnormality. Adrenals/Urinary Tract: Excreted contrast in the renal collecting systems, ureters and urinary bladder. These have normal appearances as do the adrenal glands. Stomach/Bowel: Mildly dilated stomach, without significant change. Unremarkable small bowel. The appendix is not identified. No secondary signs of appendicitis. Multiple descending and sigmoid colon diverticula without evidence of diverticulitis. Vascular/Lymphatic: Atheromatous arterial calcifications without aneurysm. No enlarged lymph nodes. Reproductive: Mildly enlarged prostate gland. Other: Small umbilical hernia containing fat. Musculoskeletal: Mild lumbar spine degenerative changes. Approximately 40% L3 vertebral compression deformity with no acute fracture lines. IMPRESSION: 1. Interval enlarged heart with a tiny amount of pericardial fluid and a percutaneous, transthoracic small caliber pericardial catheter within a tiny amount of pericardial fluid and extending inferiorly as described above. 2. Interval moderate-sized right pleural effusion and small left pleural effusion measuring water density. 3. Interval  patchy atelectasis and probable multifocal, atypical pneumonia in both lower lobes and to a lesser extent in the upper lobes, right middle lobe and lingula. 4. No acute abnormality in the abdomen or pelvis. 5. Approximately 40% L3 vertebral compression deformity with no acute fracture lines. 6. Extensive atheromatous coronary artery calcifications and calcific aortic atherosclerosis 7. Descending and sigmoid colon diverticulosis. 8. Mildly enlarged prostate gland. Aortic Atherosclerosis (ICD10-I70.0). Electronically Signed   By: Elspeth Bathe M.D.   On: 06/29/2023 18:58   CARDIAC CATHETERIZATION Result Date: 06/29/2023 Images from the original result were not included.   Prox LAD lesion is 80% stenosed.   Mid LAD lesion is 90% stenosed.   Prox LAD to Mid LAD lesion is 95% stenosed.   Post intervention, there is a 0% residual stenosis.   Post intervention, there is a 0% residual stenosis.   Post intervention, there is a 0% residual stenosis. Coronary angioplasty 06/29/2023 to LAD: Successful but complex and difficult procedure, PTCA-orbital atherectomy of the proximal, mid LAD followed by 2.75 x 48 mm Synergy XD DES deployed, 95% stenosis reduced to 0% with TIMI every 2-3 flow. Recommendation: DAPT for 6 months with aspirin  and Plavix  and then Plavix  indefinitely.   DG CHEST PORT 1 VIEW Result Date: 06/29/2023 CLINICAL DATA:  Status post cardiac catheterization. EXAM: PORTABLE CHEST 1 VIEW COMPARISON:  April 23, 2023. FINDINGS: Mild cardiomegaly is noted with central pulmonary vascular congestion. Bilateral pulmonary edema is noted. Minimal right pleural effusion may be present. Right internal jugular catheter is noted with distal tip in expected position of the SVC. Bony thorax is unremarkable. IMPRESSION:  Mild cardiomegaly with central pulmonary vascular congestion and bilateral pulmonary edema. Minimal right pleural effusion. Electronically Signed   By: Lynwood Landy Raddle M.D.   On: 06/29/2023 16:48   CARDIAC  CATHETERIZATION Result Date: 06/29/2023 1.  Hemopericardium/cardiac tamponade treated with subxiphoid pericardiocentesis removing 400 cc of bloody fluid with improvement in blood pressure 2.  Placement of a right internal jugular triple-lumen catheter under ultrasound guidance for invasive monitoring and vasoactive medication administration 3.  Placement of the left radial arterial line under ultrasound guidance  ECHOCARDIOGRAM COMPLETE Result Date: 06/29/2023    ECHOCARDIOGRAM REPORT   Patient Name:   Jose Hines. Date of Exam: 06/29/2023 Medical Rec #:  [id number]         Height:       71.0 in Accession #:    [id number]        Weight:       216.0 lb Date of Birth:  03-08-1945         BSA:          2.179 m Patient Age:    35 years          BP:           127/114 mmHg Patient Gender: M                 HR:           92 bpm. Exam Location:  Inpatient Procedure: 2D Echo, Cardiac Doppler and Color Doppler (Both Spectral and Color            Flow Doppler were utilized during procedure). Indications:    I31.3 Pericardial effusion (noninflammatory)  History:        Patient has prior history of Echocardiogram examinations, most                 recent 05/18/2023. CHF, COPD, Mitral Valve Disease,                 Signs/Symptoms:Dizziness/Lightheadedness; Risk                 Factors:Hypertension and Sleep Apnea.  Sonographer:    Ellouise Mose RDCS Referring Phys: [id number] AMY D CLEGG  Sonographer Comments: Started out a stat echo, then paused and proceded to cath lab for Pericardiocentesis. IMPRESSIONS  1. Left ventricular ejection fraction, by estimation, is <20%. The left ventricle has severely decreased function. The left ventricle demonstrates global hypokinesis. Left ventricular diastolic function could not be evaluated.  2. Large pericardial effusion. The pericardial effusion is circumferential, anterior to the right ventricle and localized near the right atrium. Findings are consistent with cardiac tamponade with  decrease in RV's ability to expand. Through exam, pericardial effusion apperas to improve.  3. Right ventricular systolic function is normal. The right ventricular size is normal.  4. The aortic valve was not well visualized. Aortic valve regurgitation is not visualized.  5. The mitral valve was not well visualized. Mild mitral valve regurgitation.  6. The inferior vena cava is dilated in size with <50% respiratory variability, suggesting right atrial pressure of 15 mmHg.  7. This was a limited stat study after the assessment of tampondage physiology through a pericardiocentesis. FINDINGS  Left Ventricle: Left ventricular ejection fraction, by estimation, is <20%. The left ventricle has severely decreased function. The left ventricle demonstrates global hypokinesis. The left ventricular internal cavity size was normal in size. Suboptimal image quality limits for assessment of left ventricular hypertrophy. Left ventricular diastolic function could not be  evaluated. Right Ventricle: The right ventricular size is normal. No increase in right ventricular wall thickness. Right ventricular systolic function is normal. Left Atrium: Left atrial size was normal in size. Right Atrium: Right atrial size was normal in size. Pericardium: A large pericardial effusion is present. The pericardial effusion is circumferential, anterior to the right ventricle and localized near the right atrium. The pericardial effusion appears to contain fibrous material. There is diastolic collapse of the right ventricular free wall. There is evidence of cardiac tamponade. Mitral Valve: The mitral valve was not well visualized. Mild mitral valve regurgitation. Tricuspid Valve: The tricuspid valve is not well visualized. Tricuspid valve regurgitation is not demonstrated. Aortic Valve: The aortic valve was not well visualized. Aortic valve regurgitation is not visualized. Pulmonic Valve: The pulmonic valve was not well visualized. Pulmonic valve  regurgitation is not visualized. Aorta: The aortic root is normal in size and structure. Venous: The inferior vena cava is dilated in size with less than 50% respiratory variability, suggesting right atrial pressure of 15 mmHg. IAS/Shunts: The interatrial septum was not well visualized.   LV Volumes (MOD) LV vol d, MOD A4C: 152.0 ml LV vol s, MOD A4C: 130.0 ml LV SV MOD A4C:     152.0 ml IVC IVC diam: 2.50 cm Stanly Leavens MD Electronically signed by Stanly Leavens MD Signature Date/Time: 06/29/2023/4:31:02 PM    Final      Medications:     Scheduled Medications:  allopurinol   300 mg Oral Daily   aspirin  EC  81 mg Oral Daily   budesonide   0.5 mg Nebulization BID   buPROPion   150 mg Oral q morning   Chlorhexidine Gluconate Cloth  6 each Topical Daily   clopidogrel   75 mg Oral Daily   ezetimibe   10 mg Oral Daily   fentaNYL  (SUBLIMAZE ) injection  25 mcg Intravenous Once   furosemide   80 mg Intravenous BID   pantoprazole   40 mg Oral QAC breakfast   Pirfenidone   801 mg Oral TID WC   rosuvastatin   20 mg Oral Daily   sodium chloride  flush  3 mL Intravenous Q12H    Infusions:  sodium chloride  Stopped (06/30/23 0554)   sodium chloride      amiodarone 30 mg/hr (06/30/23 0700)   epinephrine Stopped (06/29/23 1845)   magnesium  sulfate bolus IVPB     milrinone 0.25 mcg/kg/min (06/30/23 0700)   norepinephrine (LEVOPHED) Adult infusion 8 mcg/min (06/30/23 0700)   piperacillin-tazobactam (ZOSYN)  IV 12.5 mL/hr at 06/30/23 0700   vancomycin     vasopressin 0.03 Units/min (06/30/23 0841)    PRN Medications: sodium chloride , Place/Maintain arterial line **AND** sodium chloride , acetaminophen , nitroGLYCERIN, ondansetron  (ZOFRAN ) IV, oxyCODONE, sodium chloride  flush    Patient Profile   Jose Hines is a 78 year old with a history  chronic HFrEF, ICM, CAD  CTO RCA/high grade proxima LAD, pulmonary fibrosis, COPD, and severe Jose     Cath today for LAD stent complicated pericardial  effusion and cardiogenic shock  Assessment/Plan  CAD-S/P LAD stent complicated by Pericardial Effusion, Tamponade, Cardiogenic Shock  -Post cath developed shock. Acute Dyspnea + hypotension secondary to tamponade. Emergent pericardiocentesis with 400-500cc removed.  - Limited echo this AM with resolution of pericardial effusion. Will continue to monitor drain. I suspect it can be removed later today.  - TTE w/ severely reduced LVEF.  - Remains on Vaso 0.03 + Norepi 8 mcg + Milrinone 0.25 mcg. CVP 11. Will continue to wean pressors as able.     NSVT -  Continue amio drip until pericardial drain pulled.    CAD -H/O CTO RCA - S/P PCI to the prox LAD.    Pulmonary Fibrosis -Followed by Dr Geronimo  -Restart home meds today; discussed with pharmD.     Length of Stay: 1  Amy Clegg, NP  06/30/2023, 8:50 AM  Advanced Heart Failure Team Pager [phone] (M-F; 7a - 5p)  Please contact [facility] for night-coverage after hours (5p -7a ) and weekends on [email or web address]  CRITICAL CARE Performed by: Ria Commander   Total critical care time: 35 minutes  Critical care time was exclusive of separately billable procedures and treating other patients.  Critical care was necessary to treat or prevent imminent or life-threatening deterioration.  Critical care was time spent personally by me on the following activities: development of treatment plan with patient and/or surrogate as well as nursing, discussions with consultants, evaluation of patient's response to treatment, examination of patient, obtaining history from patient or surrogate, ordering and performing treatments and interventions, ordering and review of laboratory studies, ordering and review of radiographic studies, pulse oximetry and re-evaluation of patient's condition.

## 2023-06-30 NOTE — Progress Notes (Signed)
  Echocardiogram 2D Echocardiogram has been performed.  Koleen KANDICE Popper, RDCS 06/30/2023, 9:47 AM

## 2023-06-30 NOTE — TOC Initial Note (Signed)
 Transition of Care (TOC) - Initial/Assessment Note    Patient Details  Name: Jose Hines. MRN: [id number] Date of Birth: 1945-07-21  Transition of [facility]) CM/SW Contact:    Justina Delcia Czar, RN Phone Number: [phone] 06/30/2023, 9:23 AM  Clinical Narrative:                 TOC CM spoke to pt and states he was independent pta. Pt has Rollator and cane at home. Pt states he drives to appt.  Will continue to follow for dc needs.   Expected Discharge Plan: Home w Home Health Services Barriers to Discharge: Continued Medical Work up   Patient Goals and CMS Choice Patient states their goals for this hospitalization and ongoing recovery are:: wants to remain independent          Expected Discharge Plan and Services   Discharge Planning Services: CM Consult   Living arrangements for the past 2 months: Single Family Home Expected Discharge Date: 06/29/23                                    Prior Living Arrangements/Services Living arrangements for the past 2 months: Single Family Home Lives with:: Spouse Patient language and need for interpreter reviewed:: Yes Do you feel safe going back to the place where you live?: Yes      Need for Family Participation in Patient Care: No (Comment) Care giver support system in place?: Yes (comment)   Criminal Activity/Legal Involvement Pertinent to Current Situation/Hospitalization: No - Comment as needed  Activities of Daily Living      Permission Sought/Granted Permission sought to share information with : Case Manager, PCP, Family Supports                Emotional Assessment Appearance:: Appears stated age Attitude/Demeanor/Rapport: Engaged Affect (typically observed): Accepting Orientation: : Oriented to Self, Oriented to Place, Oriented to  Time, Oriented to Situation   Psych Involvement: No (comment)  Admission diagnosis:  Hypotension after procedure [I95.81] Hypotension [I95.9] Cardiogenic  shock (HCC) [R57.0] Patient Active Problem List   Diagnosis Date Noted   Hypotension after procedure 06/29/2023   Hypotension 06/29/2023   Cardiogenic shock (HCC) 06/29/2023   COPD with acute exacerbation (HCC) 04/23/2023   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 failure) (HCC) 04/23/2023   Mitral regurgitation 04/23/2023   OSA on CPAP 04/23/2023   IPF (idiopathic pulmonary fibrosis) (HCC) 04/23/2023   Acute respiratory failure with hypoxia (HCC) 04/23/2023   Dizziness 01/16/2021   Essential hypertension 01/16/2021   GERD 01/09/2009   History of colonic polyps 01/09/2009   PCP:  Seabron Lenis, MD Pharmacy:   [facility] [id number] [place], [place] - [address] [place] [address] Phone: [phone] Fax: [phone]     Social Drivers of Health (SDOH) Social History: SDOH Screenings   Food Insecurity: No Food Insecurity (06/29/2023)  Housing: Unknown (06/29/2023)  Transportation Needs: No Transportation Needs (06/29/2023)  Utilities: Not At Risk (06/29/2023)  Social Connections: Moderately Isolated (06/29/2023)  Tobacco Use: Medium Risk (06/23/2023)   SDOH Interventions:     Readmission Risk Interventions     No data to display

## 2023-06-30 NOTE — Plan of Care (Signed)

## 2023-07-01 ENCOUNTER — Telehealth (HOSPITAL_COMMUNITY): Payer: Self-pay | Admitting: Pharmacy Technician

## 2023-07-01 ENCOUNTER — Inpatient Hospital Stay (HOSPITAL_COMMUNITY)

## 2023-07-01 ENCOUNTER — Other Ambulatory Visit (HOSPITAL_COMMUNITY): Payer: Self-pay | Admitting: Adult Health

## 2023-07-01 ENCOUNTER — Other Ambulatory Visit (HOSPITAL_COMMUNITY): Payer: Self-pay

## 2023-07-01 ENCOUNTER — Other Ambulatory Visit: Payer: Self-pay

## 2023-07-01 DIAGNOSIS — I3139 Other pericardial effusion (noninflammatory): Secondary | ICD-10-CM | POA: Diagnosis not present

## 2023-07-01 DIAGNOSIS — R579 Shock, unspecified: Secondary | ICD-10-CM

## 2023-07-01 DIAGNOSIS — I5022 Chronic systolic (congestive) heart failure: Secondary | ICD-10-CM

## 2023-07-01 HISTORY — DX: Shock, unspecified: R57.9

## 2023-07-01 LAB — RENAL FUNCTION PANEL
Albumin: 3.1 g/dL — ABNORMAL LOW (ref 3.5–5.0)
Anion gap: 10 (ref 5–15)
BUN: 13 mg/dL (ref 8–23)
CO2: 27 mmol/L (ref 22–32)
Calcium: 8.4 mg/dL — ABNORMAL LOW (ref 8.9–10.3)
Chloride: 101 mmol/L (ref 98–111)
Creatinine, Ser: 0.66 mg/dL (ref 0.61–1.24)
GFR, Estimated: >60 mL/min (ref >60)
Glucose, Bld: 122 mg/dL — ABNORMAL HIGH (ref 70–99)
Phosphorus: 2.4 mg/dL — ABNORMAL LOW (ref 2.5–4.6)
Potassium: 4.6 mmol/L (ref 3.5–5.1)
Sodium: 138 mmol/L (ref 135–145)

## 2023-07-01 LAB — BASIC METABOLIC PANEL WITH GFR
Anion gap: 14 (ref 5–15)
Anion gap: 7 (ref 5–15)
BUN: 11 mg/dL (ref 8–23)
BUN: 11 mg/dL (ref 8–23)
CO2: 26 mmol/L (ref 22–32)
CO2: 30 mmol/L (ref 22–32)
Calcium: 8.2 mg/dL — ABNORMAL LOW (ref 8.9–10.3)
Calcium: 8.6 mg/dL — ABNORMAL LOW (ref 8.9–10.3)
Chloride: 95 mmol/L — ABNORMAL LOW (ref 98–111)
Chloride: 96 mmol/L — ABNORMAL LOW (ref 98–111)
Creatinine, Ser: 0.72 mg/dL (ref 0.61–1.24)
Creatinine, Ser: 0.79 mg/dL (ref 0.61–1.24)
GFR, Estimated: >60 mL/min (ref >60)
GFR, Estimated: >60 mL/min (ref >60)
Glucose, Bld: 122 mg/dL — ABNORMAL HIGH (ref 70–99)
Glucose, Bld: 188 mg/dL — ABNORMAL HIGH (ref 70–99)
Potassium: 3.2 mmol/L — ABNORMAL LOW (ref 3.5–5.1)
Potassium: 3.4 mmol/L — ABNORMAL LOW (ref 3.5–5.1)
Sodium: 133 mmol/L — ABNORMAL LOW (ref 135–145)
Sodium: 135 mmol/L (ref 135–145)

## 2023-07-01 LAB — COOXEMETRY PANEL
Carboxyhemoglobin: 2.8 % — ABNORMAL HIGH (ref 0.5–1.5)
Carboxyhemoglobin: 2.7 % — ABNORMAL HIGH (ref 0.5–1.5)
Methemoglobin: 0.8 % (ref 0.0–1.5)
Methemoglobin: 1 % (ref 0.0–1.5)
O2 Saturation: 65.3 %
O2 Saturation: 70.7 %
Total hemoglobin: 11.4 g/dL — ABNORMAL LOW (ref 12.0–16.0)
Total hemoglobin: 12.3 g/dL (ref 12.0–16.0)

## 2023-07-01 LAB — CBC
HCT: 31.9 % — ABNORMAL LOW (ref 39.0–52.0)
Hemoglobin: 11 g/dL — ABNORMAL LOW (ref 13.0–17.0)
MCH: 35.6 pg — ABNORMAL HIGH (ref 26.0–34.0)
MCHC: 34.5 g/dL (ref 30.0–36.0)
MCV: 103.2 fL — ABNORMAL HIGH (ref 80.0–100.0)
Platelets: 154 10*3/uL (ref 150–400)
RBC: 3.09 MIL/uL — ABNORMAL LOW (ref 4.22–5.81)
RDW: 13.9 % (ref 11.5–15.5)
WBC: 15.2 10*3/uL — ABNORMAL HIGH (ref 4.0–10.5)
nRBC: 0 % (ref 0.0–0.2)

## 2023-07-01 LAB — LD, BODY FLUID (OTHER)

## 2023-07-01 LAB — MAGNESIUM
Magnesium: 2 mg/dL (ref 1.7–2.4)
Magnesium: 2.1 mg/dL (ref 1.7–2.4)

## 2023-07-01 LAB — ECHOCARDIOGRAM LIMITED
Height: 71 in
Weight: 3474.45 [oz_av]

## 2023-07-01 LAB — GLUCOSE, CAPILLARY
Glucose-Capillary: 251 mg/dL — ABNORMAL HIGH (ref 70–99)
Glucose-Capillary: 276 mg/dL — ABNORMAL HIGH (ref 70–99)
Glucose-Capillary: 187 mg/dL — ABNORMAL HIGH (ref 70–99)
Glucose-Capillary: 179 mg/dL — ABNORMAL HIGH (ref 70–99)
Glucose-Capillary: 123 mg/dL — ABNORMAL HIGH (ref 70–99)
Glucose-Capillary: 138 mg/dL — ABNORMAL HIGH (ref 70–99)
Glucose-Capillary: 201 mg/dL — ABNORMAL HIGH (ref 70–99)
Glucose-Capillary: 154 mg/dL — ABNORMAL HIGH (ref 70–99)

## 2023-07-01 LAB — CYTOLOGY - NON PAP

## 2023-07-01 LAB — LIPOPROTEIN A (LPA): Lipoprotein (a): 22.1 nmol/L (ref <75.0)

## 2023-07-01 MED ORDER — POTASSIUM CHLORIDE CRYS ER 20 MEQ PO TBCR
40.0000 meq | EXTENDED_RELEASE_TABLET | ORAL | Status: AC
  Administered 2023-07-01 (×2): 40 meq via ORAL
  Filled 2023-07-01 (×2): qty 2

## 2023-07-01 MED ORDER — STERILE WATER FOR INJECTION IJ SOLN
INTRAMUSCULAR | Status: AC
  Filled 2023-07-01: qty 10

## 2023-07-01 MED ORDER — ENSURE MAX PROTEIN PO LIQD
11.0000 [oz_av] | Freq: Two times a day (BID) | ORAL | Status: DC
  Administered 2023-07-01 – 2023-07-03 (×4): 11 [oz_av] via ORAL
  Filled 2023-07-01 (×8): qty 330

## 2023-07-01 MED ORDER — PHENTOLAMINE MESYLATE 5 MG IJ SOLR
10.0000 mg | Freq: Once | INTRAMUSCULAR | Status: AC
  Administered 2023-07-01: 10 mg via SUBCUTANEOUS
  Filled 2023-07-01: qty 10

## 2023-07-01 MED ORDER — POTASSIUM CHLORIDE CRYS ER 20 MEQ PO TBCR: 30.0000 meq | EXTENDED_RELEASE_TABLET | ORAL | Status: DC

## 2023-07-01 MED ORDER — POLYETHYLENE GLYCOL 3350 17 G PO PACK
17.0000 g | PACK | Freq: Two times a day (BID) | ORAL | Status: DC
  Administered 2023-07-01 – 2023-07-03 (×4): 17 g via ORAL
  Filled 2023-07-01 (×4): qty 1

## 2023-07-01 MED ORDER — PROCHLORPERAZINE EDISYLATE 10 MG/2ML IJ SOLN
5.0000 mg | Freq: Four times a day (QID) | INTRAMUSCULAR | Status: DC | PRN
  Administered 2023-07-01: 5 mg via INTRAVENOUS
  Filled 2023-07-01 (×3): qty 1

## 2023-07-01 MED ORDER — SENNA 8.6 MG PO TABS
1.0000 | ORAL_TABLET | Freq: Every day | ORAL | Status: DC
  Administered 2023-07-01 – 2023-07-02 (×2): 8.6 mg via ORAL
  Filled 2023-07-01 (×2): qty 1

## 2023-07-01 MED ORDER — POTASSIUM CHLORIDE CRYS ER 20 MEQ PO TBCR
40.0000 meq | EXTENDED_RELEASE_TABLET | Freq: Once | ORAL | Status: AC
  Administered 2023-07-01: 40 meq via ORAL
  Filled 2023-07-01: qty 2

## 2023-07-01 NOTE — Progress Notes (Signed)
 Advanced Heart Failure Rounding Note  Cardiologist: Gordy Bergamo, MD  Chief Complaint:  Subjective:    6/25 Pericardial drain removed. Central line dislodged.    Earlier this morning central line infiltrated. New central line placed.    Denies SOB. Ongoing back pain.    Objective:   Weight Range: 98.5 kg Body mass index is 30.29 kg/m.   Vital Signs:   Temp:  [97.5 F (36.4 C)-98.2 F (36.8 C)] 97.8 F (36.6 C) (06/26 0700) Pulse Rate:  [58-95] 78 (06/26 0600) Resp:  [14-38] 17 (06/26 0600) BP: (89-124)/(56-100) 102/63 (06/26 0600) SpO2:  [82 %-97 %] 90 % (06/26 0600) Arterial Line BP: (45-147)/(41-135) 77/67 (06/25 1100) Last BM Date :  (PTA)  Weight change: Filed Weights   06/29/23 0715 06/30/23 0500  Weight: 98 kg 98.5 kg    Intake/Output:   Intake/Output Summary (Last 24 hours) at 07/01/2023 0730 Last data filed at 07/01/2023 0300 Gross per 24 hour  Intake 1826.91 ml  Output 2008 ml  Net -181.09 ml    CVP 6  Physical Exam   General:   No resp difficulty Neck: supple. no JVD. RIJ dressing intact.  Cor: PMI nondisplaced. Regular rate & rhythm. No rubs, gallops or murmurs.  Lungs: clear on 2 liters  Abdomen: soft, nontender, nondistended.  Extremities: no cyanosis, clubbing, rash, edema Neuro: alert & oriented x3   Telemetry  SR with PVCs   EKG    N/A  Labs    CBC Recent Labs    06/30/23 0517 07/01/23 0511  WBC 20.6* 15.2*  HGB 11.6* 11.0*  HCT 33.5* 31.9*  MCV 101.5* 103.2*  PLT 176 154   Basic Metabolic Panel Recent Labs    93/74/74 2353 07/01/23 0511  NA 135 133*  K 3.4* 3.2*  CL 95* 96*  CO2 26 30  GLUCOSE 122* 188*  BUN 11 11  CREATININE 0.79 0.72  CALCIUM  8.6* 8.2*  MG 2.1 2.0   Liver Function Tests Recent Labs    06/29/23 1703  AST 28  ALT 16  ALKPHOS 74  BILITOT 1.0  PROT 5.8*  ALBUMIN 3.1*   No results for input(s): LIPASE, AMYLASE in the last 72 hours. Cardiac Enzymes No results for input(s):  CKTOTAL, CKMB, CKMBINDEX, TROPONINI in the last 72 hours.  BNP: BNP (last 3 results) Recent Labs    04/23/23 1620 06/14/23 1200 06/29/23 1703  BNP 707.9* 657.2* 575.2*    ProBNP (last 3 results) Recent Labs    11/13/22 1539  PROBNP 400     D-Dimer No results for input(s): DDIMER in the last 72 hours. Hemoglobin A1C No results for input(s): HGBA1C in the last 72 hours. Fasting Lipid Panel No results for input(s): CHOL, HDL, LDLCALC, TRIG, CHOLHDL, LDLDIRECT in the last 72 hours. Thyroid  Function Tests No results for input(s): TSH, T4TOTAL, T3FREE, THYROIDAB in the last 72 hours.  Invalid input(s): FREET3  Other results:   Imaging    DG CHEST PORT 1 VIEW Result Date: 07/01/2023 CLINICAL DATA:  Check central line placement EXAM: PORTABLE CHEST 1 VIEW COMPARISON:  Film from earlier in the same day. FINDINGS: Previously seen right jugular central line is again noted just beneath the skin surface consistent with significant withdrawal. New left jugular central line is noted in the proximal SVC. No pneumothorax is noted. The remainder of the exam is stable. IMPRESSION: New left jugular central line in satisfactory position. No pneumothorax is seen. Electronically Signed   By: Oneil Evelyne HERO.D.  On: 07/01/2023 01:58   DG CHEST PORT 1 VIEW Result Date: 07/01/2023 CLINICAL DATA:  Check central line placement EXAM: PORTABLE CHEST 1 VIEW COMPARISON:  06/29/2023. FINDINGS: Right-sided central line appears with withdrawn into the lower neck barely visible on this examination. Cardiac shadow is enlarged but stable. Vascular congestion is seen. Mild left basilar atelectasis is noted IMPRESSION: Right sided central line has withdrawn into the lower neck when compared with the prior exam. Changes consistent with mild CHF. Electronically Signed   By: Oneil Devonshire M.D.   On: 07/01/2023 00:18   ECHOCARDIOGRAM LIMITED Result Date: 06/30/2023    ECHOCARDIOGRAM  LIMITED REPORT   Patient Name:   Jose Hines. Date of Exam: 06/30/2023 Medical Rec #:  [id number]         Height:       71.0 in Accession #:    [id number]        Weight:       217.2 lb Date of Birth:  October 03, 1945         BSA:          2.184 m Patient Age:    78 years          BP:           117/73 mmHg Patient Gender: M                 HR:           76 bpm. Exam Location:  Inpatient Procedure: 2D Echo, Limited Echo, Cardiac Doppler, Color Doppler and            Intracardiac Opacification Agent (Both Spectral and Color Flow            Doppler were utilized during procedure). Indications:    Congestive Heart Failure  History:        Patient has prior history of Echocardiogram examinations, most                 recent 06/29/2023. CHF and Hx of pericardial effusion, COPD,                 Mitral Valve Disease, Signs/Symptoms:Dizziness/Lightheadedness;                 Risk Factors:Hypertension and Sleep Apnea.  Sonographer:    Koleen Popper RDCS Referring Phys: [id number] ADITYA SABHARWAL IMPRESSIONS  1. Left ventricular ejection fraction, by estimation, is 20 to 25%. The left ventricle has severely decreased function.  2. Right ventricular systolic function is mildly reduced. The right ventricular size is normal.  3. Left atrial size was moderately dilated.  4. Right atrial size was mildly dilated.  5. The mitral valve is normal in structure. Moderate to severe mitral valve regurgitation.  6. There is mild calcification of the aortic valve. FINDINGS  Left Ventricle: Left ventricular ejection fraction, by estimation, is 20 to 25%. The left ventricle has severely decreased function. Definity contrast agent was given IV to delineate the left ventricular endocardial borders. Right Ventricle: The right ventricular size is normal. No increase in right ventricular wall thickness. Right ventricular systolic function is mildly reduced. Left Atrium: Left atrial size was moderately dilated. Right Atrium: Right atrial size was mildly  dilated. Pericardium: There is no evidence of pericardial effusion. Mitral Valve: The mitral valve is normal in structure. Moderate to severe mitral valve regurgitation. Tricuspid Valve: Tricuspid valve regurgitation is mild. Aortic Valve: There is mild calcification of the aortic valve. Pulmonic Valve: Pulmonic valve regurgitation  is mild to moderate. Aorta: The aortic root is normal in size and structure. Additional Comments: Spectral Doppler performed. Color Doppler performed.  LEFT VENTRICLE PLAX 2D LVIDd:         6.50 cm LVIDs:         6.00 cm LV PW:         1.00 cm LV IVS:        0.90 cm LVOT diam:     2.10 cm LVOT Area:     3.46 cm  LEFT ATRIUM         Index LA diam:    5.30 cm 2.43 cm/m   AORTA Ao Root diam: 3.30 cm Ao Asc diam:  3.50 cm TRICUSPID VALVE TR Peak grad:   24.0 mmHg TR Vmax:        245.00 cm/s  SHUNTS Systemic Diam: 2.10 cm Aditya Sabharwal Electronically signed by Ria Commander Signature Date/Time: 06/30/2023/5:03:45 PM    Final      Medications:     Scheduled Medications:  allopurinol   300 mg Oral Daily   aspirin  EC  81 mg Oral Daily   budesonide   0.5 mg Nebulization BID   buPROPion   150 mg Oral q morning   Chlorhexidine Gluconate Cloth  6 each Topical Daily   clopidogrel   75 mg Oral Daily   colchicine  0.6 mg Oral Daily   ezetimibe   10 mg Oral Daily   furosemide   80 mg Intravenous BID   insulin aspart  0-15 Units Subcutaneous TID WC   insulin aspart  0-5 Units Subcutaneous QHS   mexiletine  150 mg Oral Q8H   pantoprazole   40 mg Oral QAC breakfast   Pirfenidone   801 mg Oral TID   potassium chloride   40 mEq Oral Q4H   rosuvastatin   20 mg Oral Daily   sodium chloride  flush  3 mL Intravenous Q12H   sterile water (preservative free)        Infusions:  epinephrine Stopped (06/29/23 1845)   milrinone 0.25 mcg/kg/min (07/01/23 0300)   norepinephrine (LEVOPHED) Adult infusion 4 mcg/min (07/01/23 0300)   vasopressin 0.03 Units/min (07/01/23 0300)    PRN  Medications: acetaminophen , nitroGLYCERIN, ondansetron  (ZOFRAN ) IV, oxyCODONE, sodium chloride  flush, sterile water (preservative free)    Patient Profile   Jose Hines is a 78 year old with a history  chronic HFrEF, ICM, CAD  CTO RCA/high grade proxima LAD, pulmonary fibrosis, COPD, and severe Jose     Cath today for LAD stent complicated pericardial effusion and cardiogenic shock  Assessment/Plan  CAD-S/P LAD stent complicated by Pericardial Effusion, Tamponade, Cardiogenic Shock  -Post cath developed shock. Acute Dyspnea + hypotension secondary to tamponade. Emergent pericardiocentesis with 400-500cc removed.  - Limited echo 6/25 resolution of pericardial effusion.   - Pericardial drain removed yesterday. Limited Echo today LVEF 25-30% RV mildly reduced. No pericardial effusion. Moderate MVR.  -Maps improved. Stop vaso. Wean milrinone 0.125 mcg. Continue to wean norepi.  CVP 6-7. Give one more dose of 80 mg IV lasix  now.   - Tomorrow will add GDMT.   NSVT, PVCs Off amio drip. Now mexitil to suppress PVCs.    CAD -H/O CTO RCA - S/P PCI to the prox LAD.    Pulmonary Fibrosis -Followed by Dr Geronimo  -Restart home meds today; discussed with pharmD.    IV Infiltrate, Central Line CXR last night with central line out. . Remove RIJ with new one placed.  Watch for skin breakdown. WBC down to 15. Completed antibiotics.  Consult PT.   Refer to HF Paramedicine to follow at d/c.   Length of Stay: 2  Greig Mosses, NP  07/01/2023, 7:30 AM  Advanced Heart Failure Team Pager [phone] (M-F; 7a - 5p)  Please contact [facility] for night-coverage after hours (5p -7a ) and weekends on [email or web address]  CRITICAL CARE Performed by: Greig Mosses   Total critical care time: 15 minutes  Critical care time was exclusive of separately billable procedures and treating other patients.  Critical care was necessary to treat or prevent imminent or life-threatening deterioration.  Critical care  was time spent personally by me on the following activities: development of treatment plan with patient and/or surrogate as well as nursing, discussions with consultants, evaluation of patient's response to treatment, examination of patient, obtaining history from patient or surrogate, ordering and performing treatments and interventions, ordering and review of laboratory studies, ordering and review of radiographic studies, pulse oximetry and re-evaluation of patient's condition.

## 2023-07-01 NOTE — Progress Notes (Addendum)
 On initial assessment at 2000 R internal jugular CVC noted to have exposed catheter. Dressing intact and line sutured in place. At 2200 the line would only draw back a small amount, but not enough for labs despite changing caps and flushing line. Line noted to flush well. IV team consulted to look at line. With the catheter exposed, IV team nurse requested x-ray before trying to TPA line. Right internal jugular site still unremarkable with infusions running and patient not complaining of any pain.  X-ray obtained and revealed the CVC had been pulled back and barely visible on examination. Cardiology immediately paged and consulted critical care to place a new line. Upon entering room with critical care physician the right internal jugular site was noted to be swollen, cool, and pale. All drips immediately moved to ultrasound guided PIV in L forearm. Pharmacy called and ordered 10 mg of Regitine. New L internal jugular CVC placed by critical care. 5 mg of Regitine injected around dressing of R internal jugular CVC. Dressing then removed and CVC noted to be completely removed and not sutured into the skin. The only thing that was sutured in was a stabilizer that allowed the CVC to move. 5 more mg injected around the site. Site then covered with a gauze dressing. Will continue to monitor site for any changes.   Written with Dickey Lewis RN

## 2023-07-01 NOTE — Progress Notes (Addendum)
   Called by nursing staff nausea.   6/25 Started on colchicine 0.6 daily and has had his dose today.   I am concerned nausea is related to colchicine. Will stop.   Add compazine. Reassess tomorrow.   Discussed with Dr Zenaida.   Marjie Chea NP-C  3:43 PM

## 2023-07-01 NOTE — TOC Progression Note (Addendum)
 Transition of Care (TOC) - Progression Note    Patient Details  Name: Jose Hines. MRN: [id number] Date of Birth: December 12, 1945  Transition of Care [facility]) CM/SW Contact  Justina Delcia Czar, RN Phone Number: 07/01/2023, 8:20 PM  Clinical Narrative:    Attempted communication in room with patient, pt was lethargic. And requested CM speak to family.  Medicare.gov list with ratings placed in room. Living Better with HF placed in room.  Will speak to wife about HH and DME.  Pt not medical ready for dc.  Outpatient HF Paramedicine referral sent and will follow up with patient in community.    Expected Discharge Plan: Home w Home Health Services Barriers to Discharge: Continued Medical Work up  Expected Discharge Plan and Services   Discharge Planning Services: CM Consult Post Acute Care Choice: Home Health Living arrangements for the past 2 months: Single Family Home Expected Discharge Date: 06/29/23                         HH Arranged: RN, PT           Social Determinants of Health (SDOH) Interventions SDOH Screenings   Food Insecurity: No Food Insecurity (06/29/2023)  Housing: Unknown (06/29/2023)  Transportation Needs: No Transportation Needs (06/29/2023)  Utilities: Not At Risk (06/29/2023)  Social Connections: Moderately Isolated (06/29/2023)  Tobacco Use: Medium Risk (06/23/2023)    Readmission Risk Interventions     No data to display

## 2023-07-01 NOTE — Progress Notes (Signed)
   Off vaso and norepi.   On Milrinone 0.125 mcg. CO-OX 71%  Stop milrinone.   Larenz Frasier NP-C  11:49 AM

## 2023-07-01 NOTE — Progress Notes (Signed)
   Referral placed for HF Paramedicine.   Shayra Anton NP-C  12:41 PM

## 2023-07-01 NOTE — Telephone Encounter (Signed)
 Patient Product/process development scientist completed.    The patient is insured through [employer]. Patient has Medicare and is not eligible for a copay card, but may be able to apply for patient assistance or Medicare RX Payment Plan (Patient Must reach out to their plan, if eligible for payment plan), if available.    Ran test claim for mexiletine 150 mg and the current 30 day co-pay is $10.00.   This test claim was processed through [facility]- copay amounts may vary at other pharmacies due to pharmacy/plan contracts, or as the patient moves through the different stages of their insurance plan.     Reyes Sharps, CPHT Pharmacy Technician III Certified Patient Advocate [facility] Patient Advocate Team Direct Number: [phone]  Fax: [phone]

## 2023-07-01 NOTE — Procedures (Signed)
 Central Venous Catheter Insertion Procedure Note  Brynden Thune  [id number]  11-15-1945  Date:07/01/23  Time:1:41 AM   Provider Performing:Tracye Szuch D Emilio   Procedure: Insertion of Non-tunneled Central Venous [phone]) with US  guidance (23062)   Indication(s) Medication administration  Consent Risks of the procedure as well as the alternatives and risks of each were explained to the patient and/or caregiver.  Consent for the procedure was obtained and is signed in the bedside chart  Anesthesia Topical only with 1% lidocaine    Timeout Verified patient identification, verified procedure, site/side was marked, verified correct patient position, special equipment/implants available, medications/allergies/relevant history reviewed, required imaging and test results available.  Sterile Technique Maximal sterile technique including full sterile barrier drape, hand hygiene, sterile gown, sterile gloves, mask, hair covering, sterile ultrasound probe cover (if used).  Procedure Description Area of catheter insertion was cleaned with chlorhexidine and draped in sterile fashion.  With real-time ultrasound guidance a central venous catheter was placed into the left internal jugular vein. Nonpulsatile blood flow and easy flushing noted in all ports.  The catheter was sutured in place and sterile dressing applied.  Complications/Tolerance None; patient tolerated the procedure well. Chest X-ray is ordered to verify placement for internal jugular or subclavian cannulation.   Chest x-ray is not ordered for femoral cannulation.  EBL Minimal  Specimen(s) None  JD Emilio RIGGERS [facility] 07/01/2023, 1:41 AM  Please see [email or web address] for pager details.  From 7A-7P if no response, please call [phone]. After hours, please call ELink [phone].

## 2023-07-02 ENCOUNTER — Encounter (HOSPITAL_COMMUNITY): Payer: Self-pay | Admitting: Cardiology

## 2023-07-02 ENCOUNTER — Telehealth (HOSPITAL_COMMUNITY): Payer: Self-pay | Admitting: Pharmacy Technician

## 2023-07-02 ENCOUNTER — Other Ambulatory Visit (HOSPITAL_COMMUNITY): Payer: Self-pay

## 2023-07-02 LAB — MAGNESIUM: Magnesium: 2.1 mg/dL (ref 1.7–2.4)

## 2023-07-02 LAB — BODY FLUID CULTURE W GRAM STAIN: Culture: NO GROWTH

## 2023-07-02 LAB — CBC
HCT: 34.4 % — ABNORMAL LOW (ref 39.0–52.0)
Hemoglobin: 11.8 g/dL — ABNORMAL LOW (ref 13.0–17.0)
MCH: 35.8 pg — ABNORMAL HIGH (ref 26.0–34.0)
MCHC: 34.3 g/dL (ref 30.0–36.0)
MCV: 104.2 fL — ABNORMAL HIGH (ref 80.0–100.0)
Platelets: 157 10*3/uL (ref 150–400)
RBC: 3.3 MIL/uL — ABNORMAL LOW (ref 4.22–5.81)
RDW: 13.9 % (ref 11.5–15.5)
WBC: 12.9 10*3/uL — ABNORMAL HIGH (ref 4.0–10.5)
nRBC: 0 % (ref 0.0–0.2)

## 2023-07-02 LAB — BASIC METABOLIC PANEL WITH GFR
Anion gap: 11 (ref 5–15)
BUN: 12 mg/dL (ref 8–23)
CO2: 28 mmol/L (ref 22–32)
Calcium: 9 mg/dL (ref 8.9–10.3)
Chloride: 99 mmol/L (ref 98–111)
Creatinine, Ser: 0.71 mg/dL (ref 0.61–1.24)
GFR, Estimated: >60 mL/min (ref >60)
Glucose, Bld: 102 mg/dL — ABNORMAL HIGH (ref 70–99)
Potassium: 4 mmol/L (ref 3.5–5.1)
Sodium: 138 mmol/L (ref 135–145)

## 2023-07-02 LAB — GLUCOSE, CAPILLARY
Glucose-Capillary: 101 mg/dL — ABNORMAL HIGH (ref 70–99)
Glucose-Capillary: 125 mg/dL — ABNORMAL HIGH (ref 70–99)
Glucose-Capillary: 159 mg/dL — ABNORMAL HIGH (ref 70–99)
Glucose-Capillary: 130 mg/dL — ABNORMAL HIGH (ref 70–99)

## 2023-07-02 LAB — COOXEMETRY PANEL
Carboxyhemoglobin: 2 % — ABNORMAL HIGH (ref 0.5–1.5)
Methemoglobin: 0.8 % (ref 0.0–1.5)
O2 Saturation: 67 %
Total hemoglobin: 12.2 g/dL (ref 12.0–16.0)

## 2023-07-02 MED ORDER — ENOXAPARIN SODIUM 40 MG/0.4ML IJ SOSY
40.0000 mg | PREFILLED_SYRINGE | INTRAMUSCULAR | Status: DC
  Administered 2023-07-02 – 2023-07-03 (×2): 40 mg via SUBCUTANEOUS
  Filled 2023-07-02 (×2): qty 0.4

## 2023-07-02 MED ORDER — BISOPROLOL FUMARATE 5 MG PO TABS
2.5000 mg | ORAL_TABLET | Freq: Every day | ORAL | Status: DC
  Administered 2023-07-02 – 2023-07-04 (×3): 2.5 mg via ORAL
  Filled 2023-07-02: qty 1
  Filled 2023-07-02: qty 0.5
  Filled 2023-07-02: qty 1

## 2023-07-02 NOTE — Progress Notes (Signed)
 SLP Cancellation Note  Patient Details Name: Jose Hines. MRN: [id number] DOB: July 07, 1945   Cancelled treatment:       Reason Eval/Treat Not Completed: SLP screened, no needs identified, will sign off (Per discussion with RN and chart review, pt with no obvious cognitive-linguistic deficits. RN contacted ordering NP, and ST to d/c consult.)  Delon Bangs, M.S., CCC-SLP Speech-Language Pathologist Secure Chat Preferred  O: [phone]  [name] 07/02/2023, 1:03 PM

## 2023-07-02 NOTE — Progress Notes (Signed)
 Advanced Heart Failure Rounding Note  Cardiologist: Gordy Bergamo, MD  Chief Complaint:  Subjective:    6/25 Pericardial drain removed. Central line dislodged.   Central line infiltrated 6/26. New central line placed.   Feels fine this morning. Denies CP/SOB. Was able to sit up in the chair yesterday.   Objective:   Weight Range: 96 kg Body mass index is 29.52 kg/m.   Vital Signs:   Temp:  [97.5 F (36.4 C)-98.5 F (36.9 C)] 98 F (36.7 C) (06/27 0700) Pulse Rate:  [80-110] 94 (06/27 0630) Resp:  [12-31] 15 (06/27 0630) BP: (79-117)/(56-88) 106/76 (06/27 0630) SpO2:  [87 %-98 %] 94 % (06/27 0630) Weight:  [96 kg-96.9 kg] 96 kg (06/27 0444) Last BM Date :  (PTA)  Weight change: Filed Weights   06/30/23 0500 07/01/23 0823 07/02/23 0444  Weight: 98.5 kg 96.9 kg 96 kg    Intake/Output:   Intake/Output Summary (Last 24 hours) at 07/02/2023 0803 Last data filed at 07/02/2023 0300 Gross per 24 hour  Intake 39.68 ml  Output 2200 ml  Net -2160.32 ml    CVP 4/5  Physical Exam  General:  well appearing.  No respiratory difficulty. HOH Neck: supple. JVD flat. LIJ CVC Cor: PMI nondisplaced. Regular rate & rhythm. No rubs, gallops or murmurs. Lungs: clear Extremities: no cyanosis, clubbing, rash, edema  Neuro: alert & oriented x 3. Moves all 4 extremities w/o difficulty. Affect pleasant.   Telemetry  SR with PVCs 0-5/hr (Personally reviewed)    EKG  N/A Labs    CBC Recent Labs    07/01/23 0511 07/02/23 0414  WBC 15.2* 12.9*  HGB 11.0* 11.8*  HCT 31.9* 34.4*  MCV 103.2* 104.2*  PLT 154 157   Basic Metabolic Panel Recent Labs    93/73/74 0511 07/01/23 1627 07/02/23 0414  NA 133* 138 138  K 3.2* 4.6 4.0  CL 96* 101 99  CO2 30 27 28   GLUCOSE 188* 122* 102*  BUN 11 13 12   CREATININE 0.72 0.66 0.71  CALCIUM  8.2* 8.4* 9.0  MG 2.0  --  2.1  PHOS  --  2.4*  --    Liver Function Tests Recent Labs    06/29/23 1703 07/01/23 1627  AST 28  --   ALT  16  --   ALKPHOS 74  --   BILITOT 1.0  --   PROT 5.8*  --   ALBUMIN 3.1* 3.1*   No results for input(s): LIPASE, AMYLASE in the last 72 hours. Cardiac Enzymes No results for input(s): CKTOTAL, CKMB, CKMBINDEX, TROPONINI in the last 72 hours.  BNP: BNP (last 3 results) Recent Labs    04/23/23 1620 06/14/23 1200 06/29/23 1703  BNP 707.9* 657.2* 575.2*   ProBNP (last 3 results) Recent Labs    11/13/22 1539  PROBNP 400   D-Dimer No results for input(s): DDIMER in the last 72 hours. Hemoglobin A1C No results for input(s): HGBA1C in the last 72 hours. Fasting Lipid Panel No results for input(s): CHOL, HDL, LDLCALC, TRIG, CHOLHDL, LDLDIRECT in the last 72 hours. Thyroid  Function Tests No results for input(s): TSH, T4TOTAL, T3FREE, THYROIDAB in the last 72 hours.  Invalid input(s): FREET3  Other results:  Imaging   ECHOCARDIOGRAM LIMITED Result Date: 07/01/2023    ECHOCARDIOGRAM LIMITED REPORT   Patient Name:   Jose Hines. Date of Exam: 07/01/2023 Medical Rec #:  [id number]         Height:  71.0 in Accession #:    [id number]        Weight:       217.2 lb Date of Birth:  08/03/1945         BSA:          2.184 m Patient Age:    78 years          BP:           104/71 mmHg Patient Gender: M                 HR:           84 bpm. Exam Location:  Inpatient Procedure: Limited Echo, Cardiac Doppler and Color Doppler (Both Spectral and            Color Flow Doppler were utilized during procedure). Indications:    Pericardial Effusion  History:        Patient has prior history of Echocardiogram examinations, most                 recent 06/30/2023. CHF, Pericardial Disease, COPD, Mitral Valve                 Disease; Risk Factors:Sleep Apnea and Hypertension.  Sonographer:    Jayson Gaskins Referring Phys: AMY D CLEGG IMPRESSIONS  1. Left ventricular ejection fraction, by estimation, is 25 to 30%. The left ventricle has severely decreased function. The  left ventricle demonstrates global hypokinesis.  2. Right ventricular systolic function is mildly reduced.  3. Left atrial size was mildly dilated.  4. Trivial posterior effusion.  5. The mitral valve is normal in structure. Moderate mitral valve regurgitation.  6. The aortic valve is tricuspid. There is mild calcification of the aortic valve. Conclusion(s)/Recommendation(s): Trivial posterior effusion. FINDINGS  Left Ventricle: Left ventricular ejection fraction, by estimation, is 25 to 30%. The left ventricle has severely decreased function. The left ventricle demonstrates global hypokinesis. Right Ventricle: Right ventricular systolic function is mildly reduced. Left Atrium: Left atrial size was mildly dilated. Pericardium: Trivial posterior effusion. Trivial pericardial effusion is present. The pericardial effusion is posterior to the left ventricle. Mitral Valve: The mitral valve is normal in structure. Moderate mitral valve regurgitation. Aortic Valve: The aortic valve is tricuspid. There is mild calcification of the aortic valve. LEFT VENTRICLE PLAX 2D LVOT diam:     1.90 cm LVOT Area:     2.84 cm  LEFT ATRIUM           Index        RIGHT ATRIUM           Index LA Vol (A4C): 78.6 ml 36.00 ml/m  RA Area:     24.00 cm                                    RA Volume:   77.00 ml  35.26 ml/m   SHUNTS Systemic Diam: 1.90 cm Toribio Fuel MD Electronically signed by Toribio Fuel MD Signature Date/Time: 07/01/2023/9:12:25 AM    Final     Medications:   Scheduled Medications:  allopurinol   300 mg Oral Daily   aspirin  EC  81 mg Oral Daily   budesonide   0.5 mg Nebulization BID   buPROPion   150 mg Oral q morning   Chlorhexidine  Gluconate Cloth  6 each Topical Daily   clopidogrel   75 mg Oral Daily   colchicine   0.6 mg Oral  Daily   ezetimibe   10 mg Oral Daily   furosemide   80 mg Intravenous BID   insulin  aspart  0-15 Units Subcutaneous TID WC   insulin  aspart  0-5 Units Subcutaneous QHS   mexiletine   150 mg Oral Q8H   pantoprazole   40 mg Oral QAC breakfast   Pirfenidone   801 mg Oral TID   polyethylene glycol  17 g Oral BID   Ensure Max Protein  11 oz Oral BID   rosuvastatin   20 mg Oral Daily   senna  1 tablet Oral QHS   sodium chloride  flush  3 mL Intravenous Q12H    Infusions:    PRN Medications: acetaminophen , nitroGLYCERIN , ondansetron  (ZOFRAN ) IV, oxyCODONE , prochlorperazine , sodium chloride  flush  Patient Profile   Jose Hines is a 78 year old with a history  chronic HFrEF, ICM, CAD  CTO RCA/high grade proxima LAD, pulmonary fibrosis, COPD, and severe Jose     Cath today for LAD stent complicated pericardial effusion and cardiogenic shock  Assessment/Plan  CAD-S/P LAD stent complicated by Pericardial Effusion, Tamponade, Cardiogenic Shock  -Post cath developed shock. Acute Dyspnea + hypotension secondary to tamponade. Emergent pericardiocentesis with 400-500cc removed.  - Limited echo 6/25 resolution of pericardial effusion.  - Pericardial drain removed 6/25. Limited Echo 6/26 LVEF 25-30% RV mildly reduced. No pericardial effusion. Moderate MVR.  -Maps improved. Stable off vaso, milrinone  and norepi. Co-ox 67% - CVP 4/5. Stop IV lasix  - Start Jardiance 10 mg daily - Can add spiro tomorrow if BP stable - Will add GDMT slowly with soft BP.    NSVT, PVCs Off amio drip. Now mexitil  to suppress PVCs.    CAD - H/O CTO RCA - S/P PCI to the prox LAD.    Pulmonary Fibrosis -Followed by Dr Geronimo  -Home meds restarted 6/26; discussed with pharmD.    IV Infiltrate, Central Line CXR 6/25 with central line out. Remove RIJ with new one placed.  Watch for skin breakdown. WBC down to 12.9. Completed antibiotics.   Continue working with PT/OT.   HF Paramedicine team has been added for discharge.   Plan to tx to PCU today  Length of Stay: 3  Beckey LITTIE Coe, NP  07/02/2023, 8:03 AM  Advanced Heart Failure Team Pager [phone] (M-F; 7a - 5p)  Please contact [facility] for night-coverage after hours (5p -7a ) and weekends on [email or web address]  CRITICAL CARE Performed by: Beckey LITTIE Coe  Total critical care time: 14 minutes  Critical care time was exclusive of separately billable procedures and treating other patients.  Critical care was necessary to treat or prevent imminent or life-threatening deterioration.  Critical care was time spent personally by me on the following activities: development of treatment plan with patient and/or surrogate as well as nursing, discussions with consultants, evaluation of patient's response to treatment, examination of patient, obtaining history from patient or surrogate, ordering and performing treatments and interventions, ordering and review of laboratory studies, ordering and review of radiographic studies, pulse oximetry and re-evaluation of patient's condition.

## 2023-07-02 NOTE — Telephone Encounter (Signed)
 Patient Product/process development scientist completed.    The patient is insured through [employer]. Patient has Medicare and is not eligible for a copay card, but may be able to apply for patient assistance or Medicare RX Payment Plan (Patient Must reach out to their plan, if eligible for payment plan), if available.    Ran test claim for Farxiga 10 mg and the current 30 day co-pay is $64.00.  Ran test claim for Jardiance 10 mg and the current 30 day co-pay is $40.00.  This test claim was processed through [facility]- copay amounts may vary at other pharmacies due to pharmacy/plan contracts, or as the patient moves through the different stages of their insurance plan.     Roland Earl, CPHT Pharmacy Technician III Certified Patient Advocate [facility] Patient Advocate Team Direct Number: [phone]  Fax: [phone]

## 2023-07-02 NOTE — Evaluation (Signed)
 Physical Therapy Evaluation Patient Details Name: Jose Hines. MRN: [id number] DOB: [birth year] Today's Date: 07/02/2023  History of Present Illness  78 year old male admitted 6/24 for LAD stent, complicated by post-op pericardial effusion, Tamponade, and cardiogenic shock. PMH: chronic HFrEF, ICM, CAD  CTO RCA/high grade proxima LAD, pulmonary fibrosis, COPD, and severe MR  Clinical Impression  Pt admitted with above diagnosis. Independent PTA with occasional use of cane for transfers and mobility, driving occasionally, but less often over the past month. Pt HOH. Able to mobilize in and out of bed without physical assistance. CGA for safety to ambulate up to 75 feet today with mild dyspnea. SpO2 90-93% on room air. 96% at rest on 2L. BP 127/91 end of session. BP 108/93 sitting EOB. Denies dizziness throughout session. Reviewed HEP and encouraged to perform regularly. OOB with staff as tolerated. Pt currently with functional limitations due to the deficits listed below (see PT Problem List). Pt will benefit from acute skilled PT to increase their independence and safety with mobility to allow discharge.           If plan is discharge home, recommend the following: A little help with walking and/or transfers;A little help with bathing/dressing/bathroom;Assistance with cooking/housework;Assist for transportation;Help with stairs or ramp for entrance   Can travel by private vehicle        Equipment Recommendations None recommended by PT  Recommendations for Other Services       Functional Status Assessment Patient has had a recent decline in their functional status and demonstrates the ability to make significant improvements in function in a reasonable and predictable amount of time.     Precautions / Restrictions Precautions Precautions: Fall Recall of Precautions/Restrictions: Intact Restrictions Weight Bearing Restrictions Per Provider Order: No      Mobility  Bed  Mobility Overal bed mobility: Needs Assistance Bed Mobility: Supine to Sit, Sit to Supine     Supine to sit: Supervision Sit to supine: Supervision   General bed mobility comments: Supervision for safety, managed lines and leads for pt, cues for awareness. Slow to rise but stable. Able to lift his LEs into bed.    Transfers Overall transfer level: Needs assistance Equipment used: Rolling walker (2 wheels) Transfers: Sit to/from Stand Sit to Stand: Supervision           General transfer comment: Supervision for safety, RW to stabilize which did help. Cues for technique. no dizziness.    Ambulation/Gait Ambulation/Gait assistance: Contact guard assist Gait Distance (Feet): 75 Feet Assistive device: Rolling walker (2 wheels) Gait Pattern/deviations: Step-through pattern, Decreased stride length Gait velocity: dec Gait velocity interpretation: <1.8 ft/sec, indicate of risk for recurrent falls   General Gait Details: Educated on safe AD use with RW for support. Pt does feel more confident with UE support. Demonstrates good control. Minor cues for closer proximity at times. No overt LOB or buckling. mild dyspnea. SpO2 91-93% on room air during bout.  BP 127/91 HR 107 after returning to bed.  Stairs            Wheelchair Mobility     Tilt Bed    Modified Rankin (Stroke Patients Only)       Balance Overall balance assessment: Needs assistance Sitting-balance support: No upper extremity supported, Feet supported Sitting balance-Leahy Scale: Good     Standing balance support: No upper extremity supported Standing balance-Leahy Scale: Fair Standing balance comment: Prefers UE support.  Pertinent Vitals/Pain Pain Assessment Pain Assessment: No/denies pain    Home Living Family/patient expects to be discharged to:: Private residence Living Arrangements: Spouse/significant other Available Help at Discharge: Family;Available  24 hours/day (arranging and anticipated) Type of Home: House Home Access: Stairs to enter Entrance Stairs-Rails: Right;Left (none front, bil back) Entrance Stairs-Number of Steps: 1 front, 6 back   Home Layout: One level Home Equipment: Rollator (4 wheels);Cane - quad;Shower seat      Prior Function Prior Level of Function : Independent/Modified Independent;Driving             Mobility Comments: ind, intermittent use of quad cane. ADLs Comments: ind, driving much less in the past month.     Extremity/Trunk Assessment   Upper Extremity Assessment Upper Extremity Assessment: Defer to OT evaluation    Lower Extremity Assessment Lower Extremity Assessment: Generalized weakness       Communication   Communication Communication: Impaired Factors Affecting Communication: Hearing impaired    Cognition Arousal: Alert Behavior During Therapy: WFL for tasks assessed/performed   PT - Cognitive impairments: No apparent impairments                         Following commands: Intact       Cueing Cueing Techniques: Verbal cues     General Comments General comments (skin integrity, edema, etc.): wife present and supportive. Pre activity supine 10/468 PB. SpO2 96% on 2L at rest, HR 93. Sitting EOB BP 108/93.    Exercises General Exercises - Lower Extremity Ankle Circles/Pumps: AROM, Both, 15 reps, Supine Quad Sets: Strengthening, Both, 15 reps, Supine Gluteal Sets: Strengthening, Both, 15 reps, Supine   Assessment/Plan    PT Assessment Patient needs continued PT services  PT Problem List Decreased strength;Decreased activity tolerance;Decreased balance;Decreased knowledge of use of DME;Cardiopulmonary status limiting activity       PT Treatment Interventions DME instruction;Gait training;Stair training;Functional mobility training;Therapeutic activities;Therapeutic exercise;Balance training;Neuromuscular re-education;Patient/family education    PT Goals  (Current goals can be found in the Care Plan section)  Acute Rehab PT Goals Patient Stated Goal: Get well PT Goal Formulation: With patient Time For Goal Achievement: 07/16/23 Potential to Achieve Goals: Good    Frequency Min 2X/week     Co-evaluation               AM-PAC PT 6 Clicks Mobility  Outcome Measure Help needed turning from your back to your side while in a flat bed without using bedrails?: A Little Help needed moving from lying on your back to sitting on the side of a flat bed without using bedrails?: A Little Help needed moving to and from a bed to a chair (including a wheelchair)?: A Little Help needed standing up from a chair using your arms (e.g., wheelchair or bedside chair)?: A Little Help needed to walk in hospital room?: A Little Help needed climbing 3-5 steps with a railing? : A Little 6 Click Score: 18    End of Session Equipment Utilized During Treatment: Gait belt Activity Tolerance: Patient tolerated treatment well Patient left: in bed;with call bell/phone within reach;with bed alarm set;with family/visitor present Nurse Communication: Mobility status PT Visit Diagnosis: Unsteadiness on feet (R26.81);Other abnormalities of gait and mobility (R26.89);Muscle weakness (generalized) (M62.81)    Time: 9046-8981 PT Time Calculation (min) (ACUTE ONLY): 25 min   Charges:   PT Evaluation $PT Eval Low Complexity: 1 Low PT Treatments $Gait Training: 8-22 mins PT General Charges $$ ACUTE PT VISIT: 1  Visit         Leontine Roads, PT, DPT [facility]  Rehabilitation Services Physical Therapist Office: [phone] Website: [email or web address]   Leontine GORMAN Roads 07/02/2023, 11:28 AM

## 2023-07-03 DIAGNOSIS — I5022 Chronic systolic (congestive) heart failure: Secondary | ICD-10-CM

## 2023-07-03 LAB — CBC
HCT: 35.4 % — ABNORMAL LOW (ref 39.0–52.0)
Hemoglobin: 12 g/dL — ABNORMAL LOW (ref 13.0–17.0)
MCH: 35.3 pg — ABNORMAL HIGH (ref 26.0–34.0)
MCHC: 33.9 g/dL (ref 30.0–36.0)
MCV: 104.1 fL — ABNORMAL HIGH (ref 80.0–100.0)
Platelets: 191 10*3/uL (ref 150–400)
RBC: 3.4 MIL/uL — ABNORMAL LOW (ref 4.22–5.81)
RDW: 13.7 % (ref 11.5–15.5)
WBC: 10.6 10*3/uL — ABNORMAL HIGH (ref 4.0–10.5)
nRBC: 0 % (ref 0.0–0.2)

## 2023-07-03 LAB — HEMOGLOBIN A1C
Hgb A1c MFr Bld: 5.3 % (ref 4.8–5.6)
Mean Plasma Glucose: 105.41 mg/dL

## 2023-07-03 LAB — BASIC METABOLIC PANEL WITH GFR
Anion gap: 10 (ref 5–15)
BUN: 19 mg/dL (ref 8–23)
CO2: 27 mmol/L (ref 22–32)
Calcium: 8.9 mg/dL (ref 8.9–10.3)
Chloride: 101 mmol/L (ref 98–111)
Creatinine, Ser: 0.83 mg/dL (ref 0.61–1.24)
GFR, Estimated: >60 mL/min (ref >60)
Glucose, Bld: 117 mg/dL — ABNORMAL HIGH (ref 70–99)
Potassium: 4.3 mmol/L (ref 3.5–5.1)
Sodium: 138 mmol/L (ref 135–145)

## 2023-07-03 LAB — GLUCOSE, CAPILLARY
Glucose-Capillary: 96 mg/dL (ref 70–99)
Glucose-Capillary: 132 mg/dL — ABNORMAL HIGH (ref 70–99)
Glucose-Capillary: 219 mg/dL — ABNORMAL HIGH (ref 70–99)
Glucose-Capillary: 82 mg/dL (ref 70–99)

## 2023-07-03 LAB — MAGNESIUM: Magnesium: 2 mg/dL (ref 1.7–2.4)

## 2023-07-03 MED ORDER — SPIRONOLACTONE 12.5 MG HALF TABLET
12.5000 mg | ORAL_TABLET | Freq: Every day | ORAL | Status: DC
  Administered 2023-07-03 – 2023-07-04 (×2): 12.5 mg via ORAL
  Filled 2023-07-03 (×2): qty 1

## 2023-07-03 MED ORDER — EMPAGLIFLOZIN 10 MG PO TABS
10.0000 mg | ORAL_TABLET | Freq: Every day | ORAL | Status: DC
  Administered 2023-07-03 – 2023-07-04 (×2): 10 mg via ORAL
  Filled 2023-07-03 (×2): qty 1

## 2023-07-03 NOTE — Progress Notes (Signed)
 Cardiology Progress Note  Patient ID: Jose Hines. MRN: [id number] DOB: 1945-05-22 Date of Encounter: 07/03/2023 Primary Cardiologist: Gordy Bergamo, MD  Subjective   Chief Complaint: Weakness/Fatigue   HPI: Denies chest pain or trouble breathing.  Resting comfortably in the room with his wife.  ROS:  All other ROS reviewed and negative. Pertinent positives noted in the HPI.     Telemetry  Overnight telemetry shows sinus rhythm 80s, PVCs noted, which I personally reviewed.    Physical Exam   Vitals:   07/03/23 0430 07/03/23 0630 07/03/23 0708 07/03/23 0944  BP: 101/74  107/76 94/66  Pulse: 89  86   Resp: 19  20 (!) 27  Temp: 97.6 F (36.4 C)  98 F (36.7 C)   TempSrc: Oral  Oral   SpO2: 94%  99%   Weight: 95.3 kg 89.3 kg    Height:        Intake/Output Summary (Last 24 hours) at 07/03/2023 1018 Last data filed at 07/03/2023 9047 Gross per 24 hour  Intake 780 ml  Output 320 ml  Net 460 ml       07/03/2023    6:30 AM 07/03/2023    4:30 AM 07/02/2023    4:44 AM  Last 3 Weights  Weight (lbs) 196 lb 14.4 oz 210 lb 1.6 oz 211 lb 10.3 oz  Weight (kg) 89.313 kg 95.3 kg 96 kg    Body mass index is 27.46 kg/m.  General: Well nourished, well developed, in no acute distress Head: Atraumatic, normal size  Eyes: PEERLA, EOMI  Neck: Supple, no JVD Endocrine: No thryomegaly Cardiac: Normal S1, S2; RRR; no murmurs, rubs, or gallops Lungs: Clear to auscultation bilaterally, no wheezing, rhonchi or rales  Abd: Soft, nontender, no hepatomegaly  Ext: No edema, pulses 2+ Musculoskeletal: No deformities, BUE and BLE strength normal and equal Skin: Warm and dry, no rashes   Neuro: Alert and oriented to person, place, time, and situation, CNII-XII grossly intact, no focal deficits  Psych: Normal mood and affect   Cardiac Studies  TTE 07/01/2023  1. Left ventricular ejection fraction, by estimation, is 25 to 30%. The  left ventricle has severely decreased function. The left  ventricle  demonstrates global hypokinesis.   2. Right ventricular systolic function is mildly reduced.   3. Left atrial size was mildly dilated.   4. Trivial posterior effusion.   5. The mitral valve is normal in structure. Moderate mitral valve  regurgitation.   6. The aortic valve is tricuspid. There is mild calcification of the  aortic valve.   LHC 06/29/2023   Prox LAD lesion is 80% stenosed.   Mid LAD lesion is 90% stenosed.   Prox LAD to Mid LAD lesion is 95% stenosed.   Post intervention, there is a 0% residual stenosis.   Post intervention, there is a 0% residual stenosis.   Post intervention, there is a 0% residual stenosis.  Patient Profile  Jose Hines. is a 78 y.o. male with systolic heart failure (LVEF 25-30%), CAD status post PCI to the LAD (RCA CTO), pulmonary fibrosis, COPD, severe MR who was admitted on 06/29/2023 for PCI to the LAD.  Course complicated by coronary perforation with pericardial effusion and tamponade as well as cardiogenic shock.  Now status post emergent pericardiocentesis and inotropic support in the ICU.  Assessment & Plan   # Cardiogenic shock # Pericardial tamponade # Chronic systolic heart failure, EF 25 to 30% - Stable out of ICU.  Developed  coronary perforation and pericardial tamponade.  Required brief stay in the ICU.  Now on the floor. - Most recent echo showed trivial pericardial effusion.  Reports no chest pain or trouble breathing.  He is euvolemic on exam. - Continue bisoprolol 2.5 mg daily.  Add Aldactone 12.5 mg daily. - No need for Lasix . - add jardiance 10 mg daily  - Has had issues with hypotension in the outpatient setting.  Continue current regimen.  Unclear if he will tolerate further GDMT.  Would benefit from outpatient evaluation in the advanced heart failure clinic. - Evaluated by physical therapy.  Home health PT recommended.  Anticipate discharge tomorrow pending how he does today.  # CAD status post PCI to the LAD #  RCA CTO - No angina.  Continue aspirin  and Plavix .  # PVCs - Home mexiletine 150 mg 3 times daily.  # Hyperlipidemia - Crestor  and Zetia .  # Hyperglycemia - Requiring insulin .  No prior history of diabetes.  Check A1c.  # Pulm Fibrosis -home pirfenidone   FEN - No intravenous fluids - Code: Full - DVT PPx: Lovenox  - Disposition: Anticipate discharge tomorrow   For questions or updates, please contact [facility] Please consult [email or web address] for contact info under      Signed, Darryle T. Barbaraann, MD, [facility] [place]  [facility]  07/03/2023 10:18 AM

## 2023-07-03 NOTE — Progress Notes (Signed)
 Physical Therapy Treatment Patient Details Name: Jose O Gentile Jr. MRN: [id number] DOB: 14-Jul-1945 Today's Date: 07/03/2023   History of Present Illness 78 year old male admitted 6/24 for LAD stent, complicated by post-op pericardial effusion, Tamponade, and cardiogenic shock. PMH: chronic HFrEF, ICM, CAD  CTO RCA/high grade proxima LAD, pulmonary fibrosis, COPD, and severe MR    PT Comments  Patient continues to improve. Feel pt ready for discharge home with wife when medically ready.  She is willing to provide support/supervision.  REcommend continued PT at home to progress to independent.    If plan is discharge home, recommend the following: A little help with walking and/or transfers;A little help with bathing/dressing/bathroom;Assistance with cooking/housework;Assist for transportation;Help with stairs or ramp for entrance   Can travel by private vehicle        Equipment Recommendations  None recommended by PT    Recommendations for Other Services       Precautions / Restrictions Precautions Precautions: Fall Recall of Precautions/Restrictions: Intact Restrictions Weight Bearing Restrictions Per Provider Order: No     Mobility  Bed Mobility Overal bed mobility: Modified Independent                  Transfers Overall transfer level: Modified independent Equipment used: Rolling walker (2 wheels) Transfers: Sit to/from Stand, Bed to chair/wheelchair/BSC Sit to Stand: Modified independent (Device/Increase time)                Ambulation/Gait Ambulation/Gait assistance: Supervision Gait Distance (Feet): 150 Feet Assistive device: Rolling walker (2 wheels) Gait Pattern/deviations: Step-through pattern, Decreased stride length Gait velocity: dec         Stairs             Wheelchair Mobility     Tilt Bed    Modified Rankin (Stroke Patients Only)       Balance Overall balance assessment: Needs assistance Sitting-balance support: No  upper extremity supported, Feet supported Sitting balance-Leahy Scale: Normal     Standing balance support: Bilateral upper extremity supported, Reliant on assistive device for balance, During functional activity Standing balance-Leahy Scale: Poor                              Communication Communication Communication: Impaired Factors Affecting Communication: Hearing impaired  Cognition Arousal: Alert Behavior During Therapy: WFL for tasks assessed/performed   PT - Cognitive impairments: No apparent impairments                         Following commands: Intact      Cueing Cueing Techniques: Verbal cues  Exercises      General Comments        Pertinent Vitals/Pain Pain Assessment Pain Assessment: 0-10    Home Living                          Prior Function            PT Goals (current goals can now be found in the care plan section) Progress towards PT goals: Progressing toward goals    Frequency    Min 2X/week      PT Plan      Co-evaluation              AM-PAC PT 6 Clicks Mobility   Outcome Measure  Help needed turning from your back to your side while in a  flat bed without using bedrails?: None Help needed moving from lying on your back to sitting on the side of a flat bed without using bedrails?: None Help needed moving to and from a bed to a chair (including a wheelchair)?: A Little Help needed standing up from a chair using your arms (e.g., wheelchair or bedside chair)?: A Little Help needed to walk in hospital room?: A Little Help needed climbing 3-5 steps with a railing? : A Little 6 Click Score: 20    End of Session   Activity Tolerance: Patient tolerated treatment well Patient left: in bed;with call bell/phone within reach;with bed alarm set;with family/visitor present   PT Visit Diagnosis: Unsteadiness on feet (R26.81);Other abnormalities of gait and mobility (R26.89);Muscle weakness (generalized)  (M62.81)     Time: 8394-8379 PT Time Calculation (min) (ACUTE ONLY): 15 min  Charges:    $Gait Training: 8-22 mins PT General Charges $$ ACUTE PT VISIT: 1 Visit                    07/03/2023 Le[place], PT Acute Rehabilitation Services Office:  [phone]    Katharina Venus HERO 07/03/2023, 11:04 PM

## 2023-07-03 NOTE — Evaluation (Signed)
 Occupational Therapy Evaluation Patient Details Name: Jose Hines. MRN: [id number] DOB: August 09, 1945 Today's Date: 07/03/2023   History of Present Illness   78 year old male admitted 6/24 for LAD stent, complicated by post-op pericardial effusion, Tamponade, and cardiogenic shock. PMH: chronic HFrEF, ICM, CAD  CTO RCA/high grade proxima LAD, pulmonary fibrosis, COPD, and severe MR     Clinical Impressions Pt admitted based on above, and was seen based on problem list below. PTA pt was independent with ADLs and IADLs. Today pt is requiring set up  to CGA for ADLs. Bed mobility and functional transfers are  CGA. Pt with decreased activity tolerance and poor safety awareness with RW. Pt would benefit from [facility] to promote safety within the home upon d/c. OT will continue to follow acutely to maximize functional independence.        If plan is discharge home, recommend the following:   A little help with walking and/or transfers;A little help with bathing/dressing/bathroom;Direct supervision/assist for medications management;Direct supervision/assist for financial management;Supervision due to cognitive status     Functional Status Assessment   Patient has had a recent decline in their functional status and demonstrates the ability to make significant improvements in function in a reasonable and predictable amount of time.     Equipment Recommendations   BSC/3in1      Precautions/Restrictions   Precautions Precautions: Fall Recall of Precautions/Restrictions: Intact Restrictions Weight Bearing Restrictions Per Provider Order: No     Mobility Bed Mobility Overal bed mobility: Needs Assistance Bed Mobility: Sit to Supine       Sit to supine: Supervision   General bed mobility comments: Able to lift BLEs into bed and scoot self up in bed    Transfers Overall transfer level: Needs assistance Equipment used: Rolling walker (2 wheels) Transfers: Sit to/from  Stand, Bed to chair/wheelchair/BSC Sit to Stand: Supervision     Step pivot transfers: Contact guard assist     General transfer comment: CGA for balance with RW, poor safety awareness      Balance Overall balance assessment: Needs assistance Sitting-balance support: No upper extremity supported, Feet supported Sitting balance-Leahy Scale: Good     Standing balance support: Bilateral upper extremity supported, Reliant on assistive device for balance, During functional activity Standing balance-Leahy Scale: Poor Standing balance comment: Benefits from RW       ADL either performed or assessed with clinical judgement   ADL Overall ADL's : Needs assistance/impaired Eating/Feeding: Set up;Sitting   Grooming: Wash/dry hands;Contact guard assist;Standing Grooming Details (indicate cue type and reason): CGA for balance         Upper Body Dressing : Set up;Sitting   Lower Body Dressing: Contact guard assist;Sit to/from stand Lower Body Dressing Details (indicate cue type and reason): Able to figure 4 legs Toilet Transfer: Contact guard assist;Regular Toilet;Rolling walker (2 wheels) Toilet Transfer Details (indicate cue type and reason): Received in the bathroom able to use RW to ambulate to sink Toileting- Clothing Manipulation and Hygiene: Contact guard assist;Sit to/from stand       Functional mobility during ADLs: Contact guard assist;Rolling walker (2 wheels) General ADL Comments: Pt with decreased activity tolerance limiting performance     Vision Baseline Vision/History: 1 Wears glasses Vision Assessment?: Wears glasses for reading            Pertinent Vitals/Pain Pain Assessment Pain Assessment: No/denies pain     Extremity/Trunk Assessment Upper Extremity Assessment Upper Extremity Assessment: Overall WFL for tasks assessed   Lower Extremity  Assessment Lower Extremity Assessment: Defer to PT evaluation   Cervical / Trunk Assessment Cervical / Trunk  Assessment: Kyphotic   Communication Communication Communication: Impaired Factors Affecting Communication: Hearing impaired   Cognition Arousal: Alert Behavior During Therapy: WFL for tasks assessed/performed Cognition: Cognition impaired     Awareness: Intellectual awareness intact, Online awareness impaired Memory impairment (select all impairments): Short-term memory Attention impairment (select first level of impairment): Sustained attention Executive functioning impairment (select all impairments): Problem solving, Reasoning OT - Cognition Comments: Poor problem solving and sequencing skills     Following commands: Intact       Cueing  General Comments   Cueing Techniques: Verbal cues  Respiration rate increased to 40s during mobility educated on pursed lip breathing and energy conservation techniques. Access Code: [email or web address]  URL: [email or web address]  Date: 07/03/2023  Prepared by: Adrianne Savers    Patient Education  - Understanding Energy Conservation  - Energy Conservation During Daily Tasks           Home Living Family/patient expects to be discharged to:: Private residence Living Arrangements: Spouse/significant other Available Help at Discharge: Family;Available 24 hours/day Type of Home: House Home Access: Stairs to enter [employer] of Steps: 1 front, 6 back Entrance Stairs-Rails: Right;Left Home Layout: One level     Bathroom Shower/Tub: Chief Strategy Officer: Standard Bathroom Accessibility: No   Home Equipment: Rollator (4 wheels);Cane - quad;Shower seat          Prior Functioning/Environment Prior Level of Function : Independent/Modified Independent;Driving       Mobility Comments: intermittent use of quad cane.      OT Problem List: Decreased strength;Decreased range of motion;Decreased activity tolerance;Impaired balance (sitting and/or standing);Decreased safety awareness;Decreased  cognition;Decreased knowledge of use of DME or AE;Cardiopulmonary status limiting activity   OT Treatment/Interventions: Self-care/ADL training;Therapeutic exercise;Energy conservation;DME and/or AE instruction;Therapeutic activities;Patient/family education;Balance training      OT Goals(Current goals can be found in the care plan section)   Acute Rehab OT Goals Patient Stated Goal: To go home OT Goal Formulation: With patient Time For Goal Achievement: 07/17/23 Potential to Achieve Goals: Good   OT Frequency:  Min 2X/week       AM-PAC OT 6 Clicks Daily Activity     Outcome Measure Help from another person eating meals?: None Help from another person taking care of personal grooming?: A Little Help from another person toileting, which includes using toliet, bedpan, or urinal?: A Little Help from another person bathing (including washing, rinsing, drying)?: A Little Help from another person to put on and taking off regular upper body clothing?: A Little Help from another person to put on and taking off regular lower body clothing?: A Little 6 Click Score: 19   End of Session Equipment Utilized During Treatment: Gait belt;Rolling walker (2 wheels) Nurse Communication: Mobility status  Activity Tolerance: Patient tolerated treatment well Patient left: in bed;with call bell/phone within reach;with bed alarm set  OT Visit Diagnosis: Unsteadiness on feet (R26.81);Repeated falls (R29.6);Other abnormalities of gait and mobility (R26.89)                Time: 8651-8568 OT Time Calculation (min): 43 min Charges:  OT General Charges $OT Visit: 1 Visit OT Evaluation $OT Eval Moderate Complexity: 1 Mod OT Treatments $Self Care/Home Management : 23-37 mins  Adrianne BROCKS, OT  Acute Rehabilitation Services Office [phone] Secure chat preferred   Adrianne GORMAN Savers 07/03/2023, 2:52 PM

## 2023-07-03 NOTE — Plan of Care (Signed)

## 2023-07-04 ENCOUNTER — Telehealth: Payer: Self-pay | Admitting: Student

## 2023-07-04 ENCOUNTER — Encounter (INDEPENDENT_AMBULATORY_CARE_PROVIDER_SITE_OTHER): Payer: Self-pay

## 2023-07-04 DIAGNOSIS — I2510 Atherosclerotic heart disease of native coronary artery without angina pectoris: Secondary | ICD-10-CM | POA: Insufficient documentation

## 2023-07-04 LAB — CBC
HCT: 36.1 % — ABNORMAL LOW (ref 39.0–52.0)
Hemoglobin: 12 g/dL — ABNORMAL LOW (ref 13.0–17.0)
MCH: 35.2 pg — ABNORMAL HIGH (ref 26.0–34.0)
MCHC: 33.2 g/dL (ref 30.0–36.0)
MCV: 105.9 fL — ABNORMAL HIGH (ref 80.0–100.0)
Platelets: 190 10*3/uL (ref 150–400)
RBC: 3.41 MIL/uL — ABNORMAL LOW (ref 4.22–5.81)
RDW: 13.6 % (ref 11.5–15.5)
WBC: 8.9 10*3/uL (ref 4.0–10.5)
nRBC: 0 % (ref 0.0–0.2)

## 2023-07-04 LAB — GLUCOSE, CAPILLARY: Glucose-Capillary: 79 mg/dL (ref 70–99)

## 2023-07-04 LAB — MAGNESIUM: Magnesium: 2 mg/dL (ref 1.7–2.4)

## 2023-07-04 LAB — BASIC METABOLIC PANEL WITH GFR
Anion gap: 14 (ref 5–15)
BUN: 18 mg/dL (ref 8–23)
CO2: 23 mmol/L (ref 22–32)
Calcium: 8.8 mg/dL — ABNORMAL LOW (ref 8.9–10.3)
Chloride: 101 mmol/L (ref 98–111)
Creatinine, Ser: 0.76 mg/dL (ref 0.61–1.24)
GFR, Estimated: >60 mL/min (ref >60)
Glucose, Bld: 85 mg/dL (ref 70–99)
Potassium: 4.1 mmol/L (ref 3.5–5.1)
Sodium: 138 mmol/L (ref 135–145)

## 2023-07-04 MED ORDER — NITROGLYCERIN 0.4 MG SL SUBL: 0.4000 mg | SUBLINGUAL_TABLET | SUBLINGUAL | 2 refills | Status: DC | PRN

## 2023-07-04 MED ORDER — CLOPIDOGREL BISULFATE 75 MG PO TABS: 75.0000 mg | ORAL_TABLET | Freq: Every day | ORAL | 2 refills | Status: DC

## 2023-07-04 MED ORDER — EMPAGLIFLOZIN 10 MG PO TABS: 10.0000 mg | ORAL_TABLET | Freq: Every day | ORAL | 5 refills | Status: DC

## 2023-07-04 MED ORDER — BISOPROLOL FUMARATE 2.5 MG PO TABS: 2.5000 mg | ORAL_TABLET | Freq: Every day | ORAL | 5 refills | Status: DC

## 2023-07-04 MED ORDER — MEXILETINE HCL 150 MG PO CAPS
150.0000 mg | ORAL_CAPSULE | Freq: Three times a day (TID) | ORAL | 5 refills | Status: DC
Start: 2023-07-04 — End: 2023-07-21

## 2023-07-04 MED ORDER — SPIRONOLACTONE 25 MG PO TABS
12.5000 mg | ORAL_TABLET | Freq: Every day | ORAL | 5 refills | Status: DC
Start: 2023-07-04 — End: 2023-07-21

## 2023-07-04 NOTE — Discharge Summary (Signed)
 Discharge Summary   Patient ID: Jose O Daum Jr. MRN: [id number]; DOB: [birth year]  Admit date: 06/29/2023 Discharge date: 07/04/2023  PCP:  Seabron Lenis, MD   [facility] Providers Cardiologist:  Gordy Bergamo, MD  Advanced Heart Failure:  Ria Commander, DO      Discharge Diagnoses  Principal Problem:   Cardiogenic shock [facility]) Active Problems:   Hypotension after procedure   Hypotension   Shock (HCC)   CAD (coronary artery disease)   Diagnostic Studies/Procedures   Cardiac Catheterization: 06/29/2023   Prox LAD lesion is 80% stenosed.   Mid LAD lesion is 90% stenosed.   Prox LAD to Mid LAD lesion is 95% stenosed.   Post intervention, there is a 0% residual stenosis.   Post intervention, there is a 0% residual stenosis.   Post intervention, there is a 0% residual stenosis.   Coronary angioplasty 06/29/2023 to LAD: Successful but complex and difficult procedure, PTCA-orbital atherectomy of the proximal, mid LAD followed by 2.75 x 48 mm Synergy XD DES deployed, 95% stenosis reduced to 0% with TIMI every 2-3 flow.      Recommendation: DAPT for 6 months with aspirin  and Plavix  and then Plavix  indefinitely.     Pericardiocentesis: 06/29/2023 1.  Hemopericardium/cardiac tamponade treated with subxiphoid pericardiocentesis removing 400 cc of bloody fluid with improvement in blood pressure 2.  Placement of a right internal jugular triple-lumen catheter under ultrasound guidance for invasive monitoring and vasoactive medication administration  3.  Placement of the left radial arterial line under ultrasound guidance    Echocardiogram: 06/29/2023 IMPRESSIONS     1. Left ventricular ejection fraction, by estimation, is <20%. The left  ventricle has severely decreased function. The left ventricle demonstrates  global hypokinesis. Left ventricular diastolic function could not be  evaluated.   2. Large pericardial effusion. The pericardial effusion is   circumferential, anterior to the right ventricle and localized near the  right atrium. Findings are consistent with cardiac tamponade with decrease  in RV's ability to expand. Through exam,  pericardial effusion apperas to improve.   3. Right ventricular systolic function is normal. The right ventricular  size is normal.   4. The aortic valve was not well visualized. Aortic valve regurgitation  is not visualized.   5. The mitral valve was not well visualized. Mild mitral valve  regurgitation.   6. The inferior vena cava is dilated in size with <50% respiratory  variability, suggesting right atrial pressure of 15 mmHg.   7. This was a limited stat study after the assessment of tampondage  physiology through a pericardiocentesis.   Limited Echo: 06/30/2023 IMPRESSIONS     1. Left ventricular ejection fraction, by estimation, is 20 to 25%. The  left ventricle has severely decreased function.   2. Right ventricular systolic function is mildly reduced. The right  ventricular size is normal.   3. Left atrial size was moderately dilated.   4. Right atrial size was mildly dilated.   5. The mitral valve is normal in structure. Moderate to severe mitral  valve regurgitation.   6. There is mild calcification of the aortic valve.   Limited Echo: 07/01/2023 IMPRESSIONS     1. Left ventricular ejection fraction, by estimation, is 25 to 30%. The  left ventricle has severely decreased function. The left ventricle  demonstrates global hypokinesis.   2. Right ventricular systolic function is mildly reduced.   3. Left atrial size was mildly dilated.   4. Trivial posterior effusion.   5. The  mitral valve is normal in structure. Moderate mitral valve  regurgitation.   6. The aortic valve is tricuspid. There is mild calcification of the  aortic valve.   Conclusion(s)/Recommendation(s): Trivial posterior effusion.    History of Present Illness   Jose Hines. is a 78 y.o. male with  past medical history of CAD (CTO of dominant RCA and high-grade proximal LAD stenosis by catheterization on 11/2022), severe MR, idiopathic pulmonary fibrosis, COPD and chronic HFrEF (EF 30-35% by echo in 10/2022, at 35-40% in 04/2023) who presented to [facility] on 06/29/2023 for planned angioplasty of the LAD.  He was examined in clinic by Dr. Ladona on 06/23/2023 and reported having orthopnea and baseline dyspnea on exertion. It had been reviewed with CT surgery and he was felt to be too high risk for bypass surgery, therefore angioplasty of his LAD was recommended. Risks and benefits of this were reviewed and he was started on Plavix  75 mg daily.  Hospital Course   Consultants: None   He underwent PTCA with orbital arthrectomy of the proximal mid LAD followed by 2.75 x 48 mm Synergy XD DES on 06/29/2023. Was recommended to be on DAPT for 6 months with ASA and Plavix  and then Plavix  indefinitely.  While in recovery, he developed sudden dyspnea and hypotension and was started on epi and transferred to the ICU. Stat echocardiogram showed an enlarging pericardial effusion and emergent pericardiocentesis was attempted at the bedside with roughly 20 to 50 cc of volume removed and he was given multiple boluses of IV fluids. Was ultimately transferred to the catheterization lab and a drain was placed with 400 cc of bloody fluid removed. Pericardial drain was removed on 06/30/2023.  BP continued to improve and pressor support was gradually weaned. Given episodes of NSVT and PVC's, he was initially on IV Amiodarone  and this was transitioned to Mexiletine.  Repeat echocardiogram imaging was obtained during admission and his posterior pericardial effusion was trivial by most recent echocardiogram on 07/01/2023. EF remained reduced at 25 to 30%. He continued to progress well throughout the rest of admission and denied any recurrent chest pain or dyspnea on exertion. In regard to GDMT, he was continued on Bisoprolol 2.5  mg daily and was started on Aldactone 12.5 mg daily and Jardiance 10 mg daily. He previously had issues with hypotension in the outpatient setting and was recommended to address adding additional GDMT as an outpatient.  Was recommended to continue Mexiletine 150 mg 3 times daily as started during admission. He was examined by Dr. Barbaraann on 07/04/2023 and deemed stable for discharge. General Cardiology follow-up has been arranged and a staff message has been sent to arrange for an AHF TOC appointment. Home Health PT and OT were also arranged at the time of discharge.      Did the patient have an acute coronary syndrome (MI, NSTEMI, STEMI, etc) this admission?:  No                               Did the patient have a percutaneous coronary intervention (stent / angioplasty)?:  Yes.     Cath/PCI Registry Performance & Quality Measures: Aspirin  prescribed? - Yes ADP Receptor Inhibitor (Plavix /Clopidogrel , Brilinta/Ticagrelor or Effient/Prasugrel) prescribed (includes medically managed patients)? - Yes High Intensity Statin (Lipitor 40-80mg  or Crestor  20-40mg ) prescribed? - Yes For EF <40%, was ACEI/ARB prescribed? - No - Reason:  Hypotension For EF <40%, Aldosterone Antagonist (Spironolactone or Eplerenone)  prescribed? - Yes Cardiac Rehab Phase II ordered? - Yes   _____________  Discharge Vitals Blood pressure (!) 84/56, pulse 74, temperature 97.7 F (36.5 C), temperature source Oral, resp. rate 18, height 5' 11 (1.803 m), weight 92.8 kg, SpO2 98%.  Filed Weights   07/03/23 0430 07/03/23 0630 07/04/23 0552  Weight: 95.3 kg 89.3 kg 92.8 kg    Labs & Radiologic Studies  CBC Recent Labs    07/03/23 0316 07/04/23 0426  WBC 10.6* 8.9  HGB 12.0* 12.0*  HCT 35.4* 36.1*  MCV 104.1* 105.9*  PLT 191 190   Basic Metabolic Panel Recent Labs    93/73/74 1627 07/02/23 0414 07/03/23 0316 07/04/23 0426  NA 138   < > 138 138  K 4.6   < > 4.3 4.1  CL 101   < > 101 101  CO2 27   < > 27 23   GLUCOSE 122*   < > 117* 85  BUN 13   < > 19 18  CREATININE 0.66   < > 0.83 0.76  CALCIUM  8.4*   < > 8.9 8.8*  MG  --    < > 2.0 2.0  PHOS 2.4*  --   --   --    < > = values in this interval not displayed.   Liver Function Tests Recent Labs    07/01/23 1627  ALBUMIN 3.1*   No results for input(s): LIPASE, AMYLASE in the last 72 hours. High Sensitivity Troponin:   Recent Labs  Lab 06/29/23 1703  TROPONINIHS 663*    No results for input(s): TRNPT in the last 720 hours.  BNP Invalid input(s): POCBNP No results for input(s): PROBNP in the last 72 hours.  No results for input(s): BNP in the last 72 hours.  D-Dimer No results for input(s): DDIMER in the last 72 hours. Hemoglobin A1C Recent Labs    07/03/23 0316  HGBA1C 5.3   Fasting Lipid Panel No results for input(s): CHOL, HDL, LDLCALC, TRIG, CHOLHDL, LDLDIRECT in the last 72 hours. Lipoprotein (a)  Date/Time Value Ref Range Status  06/30/2023 05:17 AM 22.1 <75.0 nmol/L Final    Comment:    (NOTE) This test was developed and its performance characteristics determined by [facility]. It has not been cleared or approved by the Food and Drug Administration. Note:  Values greater than or equal to 75.0 nmol/L may       indicate an independent risk factor for CHD,       but must be evaluated with caution when applied       to non-Caucasian populations due to the       influence of genetic factors on Lp(a) across       ethnicities. Performed At: [facility] [address] [place], [place] [id number] Jennette Shorter MD Ey:[phone]     Thyroid  Function Tests No results for input(s): TSH, T4TOTAL, T3FREE, THYROIDAB in the last 72 hours.  Invalid input(s): FREET3 _____________   DG CHEST PORT 1 VIEW Result Date: 07/01/2023 CLINICAL DATA:  Check central line placement EXAM: PORTABLE CHEST 1 VIEW COMPARISON:  Film from earlier in the same day. FINDINGS: Previously seen right  jugular central line is again noted just beneath the skin surface consistent with significant withdrawal. New left jugular central line is noted in the proximal SVC. No pneumothorax is noted. The remainder of the exam is stable. IMPRESSION: New left jugular central line in satisfactory position. No pneumothorax is seen. Electronically Signed   By: Oneil  Lukens M.D.   On: 07/01/2023 01:58   DG CHEST PORT 1 VIEW Result Date: 07/01/2023 CLINICAL DATA:  Check central line placement EXAM: PORTABLE CHEST 1 VIEW COMPARISON:  06/29/2023. FINDINGS: Right-sided central line appears with withdrawn into the lower neck barely visible on this examination. Cardiac shadow is enlarged but stable. Vascular congestion is seen. Mild left basilar atelectasis is noted IMPRESSION: Right sided central line has withdrawn into the lower neck when compared with the prior exam. Changes consistent with mild CHF. Electronically Signed   By: Oneil Devonshire M.D.   On: 07/01/2023 00:18   CT CHEST ABDOMEN PELVIS WO CONTRAST Result Date: 06/29/2023 CLINICAL DATA:  Back pain. Status post pericardiocentesis today. Clinical concern for hematoma. EXAM: CT CHEST, ABDOMEN AND PELVIS WITHOUT CONTRAST TECHNIQUE: Multidetector CT imaging of the chest, abdomen and pelvis was performed following the standard protocol without IV contrast. RADIATION DOSE REDUCTION: This exam was performed according to the departmental dose-optimization program which includes automated exposure control, adjustment of the mA and/or kV according to patient size and/or use of iterative reconstruction technique. COMPARISON:  High-resolution chest CT dated 12/31/2022. FINDINGS: CT CHEST FINDINGS Cardiovascular: Atheromatous calcifications, including the coronary arteries and aorta with extensive, dense coronary artery calcifications. Interval enlarged heart. Interval percutaneous small caliber pericardial catheter within a tiny amount of pericardial fluid and extending inferiorly  with its tip at the level of the inferior aspect of the left atrium on the left, anterior to the descending thoracic aorta. Right jugular catheter with its tip in the region of the origin of the superior vena cava. Mediastinum/Nodes: Normal-sized, calcified right hilar lymph nodes. Unremarkable thyroid  gland, trachea and esophagus. Lungs/Pleura: Interval moderate-sized right pleural effusion and small left pleural effusion measuring water  density. Interval patchy atelectasis and possible pneumonia in both lower lobes and to a lesser extent in the upper lobes, right middle lobe and lingula with multifocal, somewhat nodular appearances. Right upper lobe calcified granuloma. Musculoskeletal: Mild thoracic spine degenerative changes. CT ABDOMEN PELVIS FINDINGS Hepatobiliary: Excreted contrast in the gallbladder. Normal-appearing liver. Pancreas: Unremarkable. No pancreatic ductal dilatation or surrounding inflammatory changes. Spleen: Normal in size without focal abnormality. Adrenals/Urinary Tract: Excreted contrast in the renal collecting systems, ureters and urinary bladder. These have normal appearances as do the adrenal glands. Stomach/Bowel: Mildly dilated stomach, without significant change. Unremarkable small bowel. The appendix is not identified. No secondary signs of appendicitis. Multiple descending and sigmoid colon diverticula without evidence of diverticulitis. Vascular/Lymphatic: Atheromatous arterial calcifications without aneurysm. No enlarged lymph nodes. Reproductive: Mildly enlarged prostate gland. Other: Small umbilical hernia containing fat. Musculoskeletal: Mild lumbar spine degenerative changes. Approximately 40% L3 vertebral compression deformity with no acute fracture lines. IMPRESSION: 1. Interval enlarged heart with a tiny amount of pericardial fluid and a percutaneous, transthoracic small caliber pericardial catheter within a tiny amount of pericardial fluid and extending inferiorly as  described above. 2. Interval moderate-sized right pleural effusion and small left pleural effusion measuring water  density. 3. Interval patchy atelectasis and probable multifocal, atypical pneumonia in both lower lobes and to a lesser extent in the upper lobes, right middle lobe and lingula. 4. No acute abnormality in the abdomen or pelvis. 5. Approximately 40% L3 vertebral compression deformity with no acute fracture lines. 6. Extensive atheromatous coronary artery calcifications and calcific aortic atherosclerosis 7. Descending and sigmoid colon diverticulosis. 8. Mildly enlarged prostate gland. Aortic Atherosclerosis (ICD10-I70.0). Electronically Signed   By: Elspeth Bathe M.D.   On: 06/29/2023 18:58     DG CHEST PORT 1 VIEW  Result Date: 06/29/2023 CLINICAL DATA:  Status post cardiac catheterization. EXAM: PORTABLE CHEST 1 VIEW COMPARISON:  April 23, 2023. FINDINGS: Mild cardiomegaly is noted with central pulmonary vascular congestion. Bilateral pulmonary edema is noted. Minimal right pleural effusion may be present. Right internal jugular catheter is noted with distal tip in expected position of the SVC. Bony thorax is unremarkable. IMPRESSION: Mild cardiomegaly with central pulmonary vascular congestion and bilateral pulmonary edema. Minimal right pleural effusion. Electronically Signed   By: Lynwood Landy Raddle M.D.   On: 06/29/2023 16:48    Disposition Pt is being discharged home today in good condition.  Follow-up Plans & Appointments  Follow-up Information     Vicci Rollo SAUNDERS, PA-C Follow up on 07/14/2023.   Specialty: Cardiology Why: Cardiology Hospital Follow-up on 07/14/2023 at 1:55 PM. Contact information: [address] [place] [address] [phone]         Gardenia Led, DO Follow up.   Specialty: Cardiology Why: The office should reach out to you in 1-2 business days to arrange for follow-up. If you do not hear from them, please call the number provided. Contact  information: [address] [address] [phone]                Discharge Instructions     Amb Referral to Cardiac Rehabilitation   Complete by: As directed    Diagnosis: Coronary Stents   After initial evaluation and assessments completed: Virtual Based Care may be provided alone or in conjunction with Phase 2 Cardiac Rehab based on patient barriers.: Yes   Intensive Cardiac Rehabilitation (ICR) [facility] location only OR Traditional Cardiac Rehabilitation (TCR) *If criteria for ICR are not met will enroll in TCR [facility] only): Yes   Diet - low sodium heart healthy   Complete by: As directed    Discharge instructions   Complete by: As directed    Radial Site Care Refer to this sheet in the next few weeks. These instructions provide you with information on caring for yourself after your procedure. Your caregiver may also give you more specific instructions. Your treatment has been planned according to current medical practices, but problems sometimes occur. Call your caregiver if you have any problems or questions after your procedure. HOME CARE INSTRUCTIONS You may shower the day after the procedure. Remove the bandage (dressing) and gently wash the site with plain soap and water . Gently pat the site dry.  Do not apply powder or lotion to the site.  Do not submerge the affected site in water  for 3 to 5 days.  Inspect the site at least twice daily.  Do not flex or bend the affected arm for 24 hours.  No lifting over 5 pounds (2.3 kg) for 5 days after your procedure.  Do not drive home if you are discharged the same day of the procedure. Have someone else drive you.  You may drive 24 hours after the procedure unless otherwise instructed by your caregiver.  What to expect: Any bruising will usually fade within 1 to 2 weeks.  Blood that collects in the tissue (hematoma) may be painful to the touch. It should usually decrease in size and tenderness within 1 to 2 weeks.  SEEK  IMMEDIATE MEDICAL CARE IF: You have unusual pain at the radial site.  You have redness, warmth, swelling, or pain at the radial site.  You have drainage (other than a small amount of blood on the dressing).  You have chills.  You have a fever  or persistent symptoms for more than 72 hours.  You have a fever and your symptoms suddenly get worse.  Your arm becomes pale, cool, tingly, or numb.  You have heavy bleeding from the site. Hold pressure on the site.   Increase activity slowly   Complete by: As directed        Discharge Medications Allergies as of 07/04/2023   No Known Allergies      Medication List     STOP taking these medications    furosemide  20 MG tablet Commonly known as: LASIX    losartan  25 MG tablet Commonly known as: COZAAR    metoprolol  succinate 25 MG 24 hr tablet Commonly known as: Toprol  XL       TAKE these medications    albuterol  108 (90 Base) MCG/ACT inhaler Commonly known as: VENTOLIN  HFA Inhale 1-2 puffs into the lungs every 6 (six) hours as needed for wheezing or shortness of breath.   albuterol  (2.5 MG/3ML) 0.083% nebulizer solution Commonly known as: PROVENTIL  Take 3 mLs (2.5 mg total) by nebulization every 6 (six) hours as needed for wheezing or shortness of breath.   allopurinol  300 MG tablet Commonly known as: ZYLOPRIM  Take 300 mg by mouth daily.   aspirin  81 MG tablet Take 81 mg by mouth daily.   Bisoprolol Fumarate 2.5 MG Tabs Take 2.5 mg by mouth daily.   budesonide  0.5 MG/2ML nebulizer solution Commonly known as: PULMICORT  INHALE 2 MILLILITERS VIA NEBULIZATION BY MOUTH 2 TIMES A DAY   buPROPion  150 MG 24 hr tablet Commonly known as: WELLBUTRIN  XL Take 150 mg by mouth every morning.   clopidogrel  75 MG tablet Commonly known as: PLAVIX  Take 1 tablet (75 mg total) by mouth daily.   empagliflozin 10 MG Tabs tablet Commonly known as: JARDIANCE Take 1 tablet (10 mg total) by mouth daily.   ezetimibe  10 MG  tablet Commonly known as: ZETIA  Take 10 mg by mouth daily.   mexiletine 150 MG capsule Commonly known as: MEXITIL  Take 1 capsule (150 mg total) by mouth every 8 (eight) hours.   nitroGLYCERIN  0.4 MG SL tablet Commonly known as: NITROSTAT  Place 1 tablet (0.4 mg total) under the tongue every 5 (five) minutes x 3 doses as needed for chest pain.   pantoprazole  40 MG tablet Commonly known as: PROTONIX  Take 1 tablet (40 mg total) by mouth daily before breakfast.   Pirfenidone  267 MG Tabs Take 3 tablets (801 mg total) by mouth 3 (three) times daily with meals.   polyethylene glycol 17 g packet Commonly known as: MIRALAX  / GLYCOLAX  Take 17 g by mouth daily as needed for mild constipation.   rosuvastatin  20 MG tablet Commonly known as: CRESTOR  Take 20 mg by mouth daily.   spironolactone 25 MG tablet Commonly known as: ALDACTONE Take 0.5 tablets (12.5 mg total) by mouth daily.         Outstanding Labs/Studies  BMET at Follow-up  Duration of Discharge Encounter: APP Time: 29 minutes   Signed, [name], PA-C 07/04/2023, 9:26 AM

## 2023-07-04 NOTE — Progress Notes (Signed)
 Discharge instructions (including medications) discussed with and copy provided to patient/caregiver, verbalized understanding. PIV removed and patient already dressed. Home medications returned to patient at discharge.

## 2023-07-04 NOTE — TOC Transition Note (Signed)
 Transition of Care [facility]) - Discharge Note   Patient Details  Name: Jose Hines. MRN: [id number] Date of Birth: 05-25-1945  [facility]) CM/SW Contact:  Marval Gell, RN Phone Number: 07/04/2023, 9:35 AM   Clinical Narrative:     Spoke w patient and wife at bedside. Reviewed Medicare ratings for [facility] with them and [facility] chosen. Referral accepted. They have shower chair at home, declined 3/1. Wife will transport home. No other TOC needs identified for DC   Final next level of care: Home w Home Health Services Barriers to Discharge: No Barriers Identified   Patient Goals and CMS Choice Patient states their goals for this hospitalization and ongoing recovery are:: wants to remain independent CMS Medicare.gov Compare Post Acute Care list provided to:: Patient Choice offered to / list presented to : Patient      Discharge Placement                       Discharge Plan and Services Additional resources added to the After Visit Summary for     Discharge Planning Services: CM Consult Post Acute Care Choice: Home Health          DME Arranged: N/A         HH Arranged: PT, OT HH Agency: [facility] Date [facility] Agency Contacted: 07/04/23 Time HH Agency Contacted: [id number] Representative spoke with at [facility] Agency: [place]  Social Drivers of Health (SDOH) Interventions SDOH Screenings   Food Insecurity: No Food Insecurity (06/29/2023)  Housing: Low Risk  (07/01/2023)  Transportation Needs: No Transportation Needs (06/29/2023)  Utilities: Not At Risk (06/29/2023)  Social Connections: Moderately Isolated (06/29/2023)  Tobacco Use: Medium Risk (07/02/2023)     Readmission Risk Interventions     No data to display

## 2023-07-04 NOTE — Telephone Encounter (Signed)
   Transition of Care Follow-up Phone Call Request    Patient Name: Jose O Schroth Jr. Date of Birth: 1945/11/01 Date of Encounter: 07/04/2023  Primary Care Provider:  Seabron Lenis, MD Primary Cardiologist:  Gordy Bergamo, MD  [place]ley MALVA Con Raddle. has been scheduled for a transition of care follow up appointment with a [facility] provider:  Rollo Louder, PA-C on 07/14/2023  Please reach out to [name] within 48 hours of discharge to confirm appointment and review transition of care protocol questionnaire. Anticipated discharge date: 07/04/2023  [name], [name]  07/04/2023, 9:29 AM

## 2023-07-04 NOTE — Plan of Care (Signed)
  Problem: Clinical Measurements: Goal: Will remain free from infection Outcome: Progressing   Problem: Clinical Measurements: Goal: Respiratory complications will improve Outcome: Progressing   Problem: Education: Goal: Knowledge of General Education information will improve Description: Including pain rating scale, medication(s)/side effects and non-pharmacologic comfort measures Outcome: Progressing   

## 2023-07-05 ENCOUNTER — Other Ambulatory Visit: Payer: Self-pay

## 2023-07-05 NOTE — Progress Notes (Signed)
 Specialty Pharmacy Refill Coordination Note  Jose MALVA [place]. is a 78 y.o. male contacted today regarding refills of specialty medication(s) Pirfenidone    Patient requested (Patient-Rptd) Delivery   Delivery date: 07/06/23   Verified address: (Patient-Rptd) [address] [place] [address]   Medication will be filled on 07/05/23.

## 2023-07-05 NOTE — Telephone Encounter (Signed)
 Patient discharged from [facility] on 07/04/23. TOC call, no answer. Left VM per DPR asking patient to call our office to set up post hospital follow up plan.

## 2023-07-06 ENCOUNTER — Telehealth (HOSPITAL_COMMUNITY): Payer: Self-pay | Admitting: Emergency Medicine

## 2023-07-06 NOTE — Telephone Encounter (Signed)
 Called w/ no answer.  LVM requesting pt to call me back regarding Paramedicine home visit.    Mary Sharps, EMT-Paramedic [phone] 07/06/2023

## 2023-07-06 NOTE — Telephone Encounter (Signed)
 Left message to return call

## 2023-07-06 NOTE — Telephone Encounter (Signed)
 Attempted to call w/ VM only.  LVM with my contact information for pt to return my call to set up home visit.    Mary Sharps, EMT-Paramedic [phone] 07/06/2023

## 2023-07-07 ENCOUNTER — Telehealth (HOSPITAL_COMMUNITY): Payer: Self-pay | Admitting: Emergency Medicine

## 2023-07-07 ENCOUNTER — Telehealth (HOSPITAL_COMMUNITY): Payer: Self-pay | Admitting: Vascular Surgery

## 2023-07-07 NOTE — Telephone Encounter (Signed)
 LVM giving hosp f/u appt

## 2023-07-07 NOTE — Telephone Encounter (Signed)
 Call w/o answer.  LVM w/ my name and information requesting a call back to discuss Paramedicine Program and hopefully set up a home visit.    Mary Sharps, EMT-Paramedic [phone] 07/07/2023

## 2023-07-08 ENCOUNTER — Encounter: Payer: Self-pay | Admitting: Cardiology

## 2023-07-08 ENCOUNTER — Telehealth (HOSPITAL_COMMUNITY): Payer: Self-pay | Admitting: Emergency Medicine

## 2023-07-08 NOTE — Telephone Encounter (Signed)
 Left message to call back

## 2023-07-08 NOTE — Telephone Encounter (Signed)
 Spoke w/ Mrs. Tones and introduced myself as Mr. Purdy CHP.  I suggested we meet at Mr. Serviss H&V appointment on Tuesday 7/8 @ 8:30 and she agreed w/ same.  She advised Mr. Joynt was sleeping and she would make him aware of the arrangements.    Mary Sharps, EMT-Paramedic [phone] 07/08/2023

## 2023-07-11 NOTE — Progress Notes (Signed)
 Class I obesity.   Darryle T. Barbaraann, MD, [facility]  [facility]  [address] [place], [place] [address] [phone]  6:04 PM

## 2023-07-11 NOTE — Progress Notes (Signed)
 Hypokalemia present.   Darryle T. Barbaraann, MD, [facility]  [facility]  [address] [place], [place] [address] [phone]  6:04 PM

## 2023-07-12 ENCOUNTER — Telehealth (HOSPITAL_COMMUNITY): Payer: Self-pay | Admitting: *Deleted

## 2023-07-12 ENCOUNTER — Telehealth (HOSPITAL_COMMUNITY): Payer: Self-pay

## 2023-07-12 NOTE — Telephone Encounter (Signed)
Patients wife advised and verbalized understanding.  

## 2023-07-12 NOTE — Progress Notes (Deleted)
 Cardiology Office Note   Date:  07/12/2023  ID:  Domenick MALVA Con Mickey., DOB Oct 11, 1945, MRN [id number] PCP: Seabron Lenis, MD  [facility] Providers Cardiologist:  Gordy Bergamo, MD Advanced Heart Failure:  Ria Commander, DO     History of Present Illness Codi O Antonyo Hinderer. is a 78 y.o. male with a past medical history of HFrEF, ischemic cardiomyopathy, CAD, severe mitral regurgitation, idiopathic pulmonary fibrosis, COPD.  Patient is followed by Dr. Bergamo, Dr. Commander. Presents today for a hospital follow up appointment   Patient first establish care with Dr. Bergamo in 10/2022 after he was referred to cardiology for evaluation of dyspnea.  Echocardiogram 10/2022 showed EF 30-35%, grade 2 diastolic dysfunction, normal RV systolic function.  He underwent right/left heart catheterization 11/2022 that showed 100% stenosis in proximal/mid RCA, severe heavily calcified stenosis throughout the proximal LAD, mild nonobstructive plaque in the proximal left circumflex.  Considered PCI of the LAD with orbital atherectomy versus bypass surgery.  Later, echocardiogram in 04/2023 showed EF 35-40%, normal RV systolic function, moderate-severe mitral valve regurgitation.  For further evaluation of mitral valve disease, underwent TEE 05/2023 that showed EF 25-30%, grade 2 DD, moderately reduced RV systolic function, moderate-severe mitral valve regurgitation.  There was mitral annular dilation and ischemic tethering of both mitral valve leaflets.  Patient was referred to advanced heart failure and seen 06/14/2023.  Case was discussed between advanced heart failure Dr. Commander, Dr. Bergamo, and Dr. Wendel with structural heart. Decided to proceed with LHC and PCI to the LAD.  He presented on 6/24 for outpatient PCI.  Underwent successful complex difficult procedure, PTCA-orbital atherectomy of the proximal, mid LAD followed by DES placement.  Following cath, patient developed sudden severe dyspnea, hypotension,  altered mental status.  Was started on epi and admitted to heart.  Also noted to have frequent ectopy and NSVT on monitor.  Underwent stat echo that showed pericardial effusion with RV compression.  He was taken urgently to the Cath Lab for pericardiocentesis with 400-500 cc removed.  Limited echo 6/25 showed EF 20-25%, mildly reduced RV systolic function, moderate-severe mitral valve regurgitation, no evidence of pericardial effusion.  Remain on amiodarone  drip for treatment of NSVT.  Pericardial drain was removed 6/25.  IV amiodarone  was stopped and patient was started on Mexitil  to suppress PVCs.  Was also started on colchicine  but this was discontinued due to nausea.  Was able to be weaned off pressors 6/26  He was able to be discharged 6/29 on aspirin  81 mg daily, bisoprolol  2.5 mg daily, Plavix  75 mg daily, Jardiance  10 mg daily, Zetia  10 mg daily, mexiletine 150 mg 3 times daily, Crestor  20 mg daily, spironolactone  12.5 mg daily.  ROS: ***  Studies Reviewed      *** Risk Assessment/Calculations {Does this patient have ATRIAL FIBRILLATION?:[id number]} No BP recorded.  {Refresh Note OR Click here to enter BP  :1}***       Physical Exam VS:  There were no vitals taken for this visit.       Wt Readings from Last 3 Encounters:  07/04/23 204 lb 9.4 oz (92.8 kg)  06/23/23 213 lb 12.8 oz (97 kg)  06/14/23 216 lb 3.2 oz (98.1 kg)    GEN: Well nourished, well developed in no acute distress NECK: No JVD; No carotid bruits CARDIAC: ***RRR, no murmurs, rubs, gallops RESPIRATORY:  Clear to auscultation without rales, wheezing or rhonchi  ABDOMEN: Soft, non-tender, non-distended EXTREMITIES:  No edema; No deformity   ASSESSMENT AND  PLAN *** {The patient has an active order for outpatient cardiac rehabilitation.   Please indicate if the patient is ready to start. Do NOT delete this.  It will auto delete.  Refresh note, then sign.              Click here to document readiness and see  contraindications.  :1}  Cardiac Rehabilitation Eligibility Assessment      {Are you ordering a CV Procedure (e.g. stress test, cath, DCCV, TEE, etc)?   Press F2        :[id number]}  Dispo: ***  Signed, Rollo FABIENE Louder, PA-C

## 2023-07-12 NOTE — Progress Notes (Incomplete)
 Advanced Heart Failure Clinic Note   Referring Physician: PCP: Seabron Lenis, MD PCP-Cardiologist: Gordy Bergamo, MD  AHF: Dr. Gardenia   Chief Complaint: Post hospital f/u for systolic heart failure, pericardial effusion/ cardiogenic shock post PCI complication  HPI:  [name] is a  76 male with hx of ischemic cardiomyopathy, coronary artery disease with CTO of the RCA and 80% prox LAD stenosis noted on cath in 11/24, pulmonary fibrosis and severe mitral regurgitation with an EF of 20 to 25%. He was recently admitted 6/25 for elective PCI of the LAD.  Unfortunately shortly after PCI he became severely hypotensive. Emergent TTE with severely reduced LVEF and enlarging pericardial effusion. Due to rapidly escalating pressor requirements we attempted emergent pericardiocentesis at bedside with roughly 20-50cc of volume removal. He was given multiple boluses of IVF and taken to the cath lab where drain was placed; 400cc bloody fluid removed with immediate improvement in BP, though remained hypotensive requiring pressor/inotropic support. He was admitted to the ICU for further management for cardiogenic shock. Repeat Limited echo the next day demonstrated resolution of pericardial effusion, though severely reduced LVEF. He was able to wean off pressors and inotrope's. Once pericardial drain output was <50 mL/day, pericardial drain was pulled. Hemodynamics were monitored and he remained stable.  BP improved allowing addition of HF GDMT. Colchicine  was added but later discontinued after development of nausea.   He presents today for post hospital f/u          Review of Systems: [y] = yes, [ ]  = no   General: Weight gain [ ] ; Weight loss [ ] ; Anorexia [ ] ; Fatigue [ ] ; Fever [ ] ; Chills [ ] ; Weakness [ ]   Cardiac: Chest pain/pressure [ ] ; Resting SOB [ ] ; Exertional SOB [ ] ; Orthopnea [ ] ; Pedal Edema [ ] ; Palpitations [ ] ; Syncope [ ] ; Presyncope [ ] ; Paroxysmal nocturnal dyspnea[ ]   Pulmonary:  Cough [ ] ; Wheezing[ ] ; Hemoptysis[ ] ; Sputum [ ] ; Snoring [ ]   GI: Vomiting[ ] ; Dysphagia[ ] ; Melena[ ] ; Hematochezia [ ] ; Heartburn[ ] ; Abdominal pain [ ] ; Constipation [ ] ; Diarrhea [ ] ; BRBPR [ ]   GU: Hematuria[ ] ; Dysuria [ ] ; Nocturia[ ]   Vascular: Pain in legs with walking [ ] ; Pain in feet with lying flat [ ] ; Non-healing sores [ ] ; Stroke [ ] ; TIA [ ] ; Slurred speech [ ] ;  Neuro: Headaches[ ] ; Vertigo[ ] ; Seizures[ ] ; Paresthesias[ ] ;Blurred vision [ ] ; Diplopia [ ] ; Vision changes [ ]   Ortho/Skin: Arthritis [ ] ; Joint pain [ ] ; Muscle pain [ ] ; Joint swelling [ ] ; Back Pain [ ] ; Rash [ ]   Psych: Depression[ ] ; Anxiety[ ]   Heme: Bleeding problems [ ] ; Clotting disorders [ ] ; Anemia [ ]   Endocrine: Diabetes [ ] ; Thyroid  dysfunction[ ]    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   Alcoholic polyneuropathy (HCC)    Allergy    Asthma    BPH (benign prostatic hyperplasia)    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 failure) (HCC) 04/23/2023   COPD (chronic obstructive pulmonary disease) (HCC)    Depression    Depression    Diverticulosis    Dyspnea on exertion    Elevated LDL cholesterol level    GERD with stricture    Gout    Hiatal hernia    Hypertension    IPF (idiopathic pulmonary fibrosis) (HCC) 04/23/2023   Kidney stones    2 times - passed stones, no  surgery required   Lumbar and sacral osteoarthritis    Mitral regurgitation 04/23/2023   OSA on CPAP 04/23/2023   Peripheral polyneuropathy    Sleep apnea    wears CPAP   Wears hearing aid    Bil    Current Outpatient Medications  Medication Sig Dispense Refill   albuterol  (PROVENTIL  HFA;VENTOLIN  HFA) 108 (90 Base) MCG/ACT inhaler Inhale 1-2 puffs into the lungs every 6 (six) hours as needed for wheezing or shortness of breath. 1 Inhaler 0   albuterol  (PROVENTIL ) (2.5 MG/3ML) 0.083% nebulizer solution Take 3 mLs (2.5 mg total) by nebulization every 6 (six) hours as  needed for wheezing or shortness of breath. 75 mL 12   allopurinol  (ZYLOPRIM ) 300 MG tablet Take 300 mg by mouth daily.     aspirin  81 MG tablet Take 81 mg by mouth daily.     bisoprolol  2.5 MG TABS Take 2.5 mg by mouth daily. 30 tablet 5   budesonide  (PULMICORT ) 0.5 MG/2ML nebulizer solution INHALE 2 MILLILITERS VIA NEBULIZATION BY MOUTH 2 TIMES A DAY 120 mL 0   buPROPion  (WELLBUTRIN  XL) 150 MG 24 hr tablet Take 150 mg by mouth every morning.     clopidogrel  (PLAVIX ) 75 MG tablet Take 1 tablet (75 mg total) by mouth daily. 90 tablet 2   empagliflozin  (JARDIANCE ) 10 MG TABS tablet Take 1 tablet (10 mg total) by mouth daily. 30 tablet 5   ezetimibe  (ZETIA ) 10 MG tablet Take 10 mg by mouth daily.     mexiletine (MEXITIL ) 150 MG capsule Take 1 capsule (150 mg total) by mouth every 8 (eight) hours. 90 capsule 5   nitroGLYCERIN  (NITROSTAT ) 0.4 MG SL tablet Place 1 tablet (0.4 mg total) under the tongue every 5 (five) minutes x 3 doses as needed for chest pain. 25 tablet 2   pantoprazole  (PROTONIX ) 40 MG tablet Take 1 tablet (40 mg total) by mouth daily before breakfast. 90 tablet 1   Pirfenidone  267 MG TABS Take 3 tablets (801 mg total) by mouth 3 (three) times daily with meals. 270 tablet 4   polyethylene glycol (MIRALAX  / GLYCOLAX ) 17 g packet Take 17 g by mouth daily as needed for mild constipation. 14 each 0   rosuvastatin  (CRESTOR ) 20 MG tablet Take 20 mg by mouth daily.     spironolactone  (ALDACTONE ) 25 MG tablet Take 0.5 tablets (12.5 mg total) by mouth daily. 15 tablet 5   No current facility-administered medications for this visit.    No Known Allergies    Social History   Socioeconomic History   Marital status: Married    Spouse name: Not on file   Number of children: 2   Years of education: Not on file   Highest education level: Not on file  Occupational History   Occupation: Engineer, site  Tobacco Use   Smoking status: Former    Current packs/day: 0.00    Average  packs/day: 2.0 packs/day for 10.0 years (20.0 ttl pk-yrs)    Types: Cigarettes    Start date: 12/29/1958    Quit date: 12/28/1968    Years since quitting: 54.5   Smokeless tobacco: Never  Vaping Use   Vaping status: Never Used  Substance and Sexual Activity   Alcohol use: Yes    Alcohol/week: 7.0 - 14.0 standard drinks of alcohol    Types: 7 - 14 Standard drinks or equivalent per week    Comment: bourbon   Drug use: No   Sexual activity: Not on file  Other  Topics Concern   Not on file  Social History Narrative   Not on file   Social Drivers of Health   Financial Resource Strain: Not on file  Food Insecurity: No Food Insecurity (06/29/2023)   Hunger Vital Sign    Worried About Running Out of Food in the Last Year: Never true    Ran Out of Food in the Last Year: Never true  Transportation Needs: No Transportation Needs (06/29/2023)   PRAPARE - Administrator, Civil Service (Medical): No    Lack of Transportation (Non-Medical): No  Physical Activity: Not on file  Stress: Not on file  Social Connections: Moderately Isolated (06/29/2023)   Social Connection and Isolation Panel    Frequency of Communication with Friends and Family: More than three times a week    Frequency of Social Gatherings with Friends and Family: More than three times a week    Attends Religious Services: Never    Database administrator or Organizations: No    Attends Banker Meetings: Never    Marital Status: Married  Catering manager Violence: Not At Risk (06/29/2023)   Humiliation, Afraid, Rape, and Kick questionnaire    Fear of Current or Ex-Partner: No    Emotionally Abused: No    Physically Abused: No    Sexually Abused: No      Family History  Problem Relation Age of Onset   Transient ischemic attack Mother    Heart disease Father 53   Other Son        car wreck   Colon cancer Neg Hx    Esophageal cancer Neg Hx    Rectal cancer Neg Hx    Stomach cancer Neg Hx      There were no vitals filed for this visit.   PHYSICAL EXAM: General:  Well appearing. No respiratory difficulty HEENT: normal Neck: supple. no JVD. Carotids 2+ bilat; no bruits. No lymphadenopathy or thyromegaly appreciated. Cor: PMI nondisplaced. Regular rate & rhythm. No rubs, gallops or murmurs. Lungs: clear Abdomen: soft, nontender, nondistended. No hepatosplenomegaly. No bruits or masses. Good bowel sounds. Extremities: no cyanosis, clubbing, rash, edema Neuro: alert & oriented x 3, cranial nerves grossly intact. moves all 4 extremities w/o difficulty. Affect pleasant.  ECG:   ASSESSMENT & PLAN:     Caffie Shed, PA-C 07/12/23

## 2023-07-12 NOTE — Telephone Encounter (Signed)
 Patients wife Arland, called to report that since Friday july 4th 2025 patient has been weak,Nauseous,Vomiting(yesterday 07/11/23 vomit had a brown tint), dizziness and a loss of appetite. Patient does not want to go to the ER and does not have a way of checking vitals at this time. Patient does have a pending appointment here in the office tomorrow. Please advise.

## 2023-07-12 NOTE — Telephone Encounter (Signed)
 See telephone note

## 2023-07-12 NOTE — Telephone Encounter (Signed)
 Called to confirm/remind patient of their appointment at the Advanced Heart Failure Clinic on 07/13/23.       Appointment:              [] Confirmed             [x] Left mess              [] No answer/No voice mail             [] Phone not in service   Patient reminded to bring all medications and/or complete list.   Confirmed patient has transportation. Gave directions, instructed to utilize valet parking.

## 2023-07-13 ENCOUNTER — Telehealth (HOSPITAL_COMMUNITY): Payer: Self-pay | Admitting: Pharmacy Technician

## 2023-07-13 ENCOUNTER — Ambulatory Visit (HOSPITAL_COMMUNITY)
Admission: RE | Admit: 2023-07-13 | Discharge: 2023-07-13 | Disposition: A | Discharge status: Home / Self Care | Source: Ambulatory Visit | Attending: Cardiology

## 2023-07-13 ENCOUNTER — Other Ambulatory Visit (HOSPITAL_COMMUNITY)

## 2023-07-13 ENCOUNTER — Inpatient Hospital Stay (HOSPITAL_COMMUNITY)
Admission: EM | Admit: 2023-07-13 | Discharge: 2023-08-06 | DRG: 308 | Disposition: E | Source: Ambulatory Visit | Attending: Cardiology | Admitting: Cardiology

## 2023-07-13 ENCOUNTER — Other Ambulatory Visit (HOSPITAL_COMMUNITY): Payer: Self-pay

## 2023-07-13 ENCOUNTER — Other Ambulatory Visit: Payer: Self-pay

## 2023-07-13 ENCOUNTER — Emergency Department (HOSPITAL_COMMUNITY)

## 2023-07-13 ENCOUNTER — Encounter (HOSPITAL_COMMUNITY): Payer: Self-pay | Admitting: Internal Medicine

## 2023-07-13 ENCOUNTER — Encounter: Payer: Self-pay | Admitting: Cardiology

## 2023-07-13 ENCOUNTER — Other Ambulatory Visit (HOSPITAL_COMMUNITY): Payer: Self-pay | Admitting: Emergency Medicine

## 2023-07-13 ENCOUNTER — Encounter (HOSPITAL_COMMUNITY): Payer: Self-pay

## 2023-07-13 VITALS — BP 100/60 | HR 110

## 2023-07-13 DIAGNOSIS — Z8249 Family history of ischemic heart disease and other diseases of the circulatory system: Secondary | ICD-10-CM | POA: Diagnosis not present

## 2023-07-13 DIAGNOSIS — I4891 Unspecified atrial fibrillation: Secondary | ICD-10-CM | POA: Insufficient documentation

## 2023-07-13 DIAGNOSIS — J449 Chronic obstructive pulmonary disease, unspecified: Secondary | ICD-10-CM | POA: Diagnosis not present

## 2023-07-13 DIAGNOSIS — K219 Gastro-esophageal reflux disease without esophagitis: Secondary | ICD-10-CM | POA: Diagnosis not present

## 2023-07-13 DIAGNOSIS — R630 Anorexia: Secondary | ICD-10-CM | POA: Diagnosis present

## 2023-07-13 DIAGNOSIS — Z860101 Personal history of adenomatous and serrated colon polyps: Secondary | ICD-10-CM

## 2023-07-13 DIAGNOSIS — I517 Cardiomegaly: Secondary | ICD-10-CM | POA: Diagnosis not present

## 2023-07-13 DIAGNOSIS — R7401 Elevation of levels of liver transaminase levels: Secondary | ICD-10-CM | POA: Diagnosis not present

## 2023-07-13 DIAGNOSIS — E872 Acidosis, unspecified: Secondary | ICD-10-CM | POA: Diagnosis present

## 2023-07-13 DIAGNOSIS — Z7189 Other specified counseling: Secondary | ICD-10-CM | POA: Diagnosis not present

## 2023-07-13 DIAGNOSIS — Z87891 Personal history of nicotine dependence: Secondary | ICD-10-CM

## 2023-07-13 DIAGNOSIS — R197 Diarrhea, unspecified: Secondary | ICD-10-CM | POA: Diagnosis present

## 2023-07-13 DIAGNOSIS — I5022 Chronic systolic (congestive) heart failure: Secondary | ICD-10-CM | POA: Insufficient documentation

## 2023-07-13 DIAGNOSIS — I493 Ventricular premature depolarization: Secondary | ICD-10-CM | POA: Diagnosis not present

## 2023-07-13 DIAGNOSIS — Z955 Presence of coronary angioplasty implant and graft: Secondary | ICD-10-CM | POA: Insufficient documentation

## 2023-07-13 DIAGNOSIS — R570 Cardiogenic shock: Secondary | ICD-10-CM | POA: Diagnosis present

## 2023-07-13 DIAGNOSIS — I340 Nonrheumatic mitral (valve) insufficiency: Secondary | ICD-10-CM | POA: Diagnosis present

## 2023-07-13 DIAGNOSIS — R7989 Other specified abnormal findings of blood chemistry: Secondary | ICD-10-CM | POA: Diagnosis not present

## 2023-07-13 DIAGNOSIS — I3139 Other pericardial effusion (noninflammatory): Secondary | ICD-10-CM | POA: Diagnosis present

## 2023-07-13 DIAGNOSIS — Z66 Do not resuscitate: Secondary | ICD-10-CM | POA: Diagnosis not present

## 2023-07-13 DIAGNOSIS — Z515 Encounter for palliative care: Secondary | ICD-10-CM

## 2023-07-13 DIAGNOSIS — N400 Enlarged prostate without lower urinary tract symptoms: Secondary | ICD-10-CM | POA: Diagnosis present

## 2023-07-13 DIAGNOSIS — I5023 Acute on chronic systolic (congestive) heart failure: Secondary | ICD-10-CM | POA: Diagnosis not present

## 2023-07-13 DIAGNOSIS — K7200 Acute and subacute hepatic failure without coma: Secondary | ICD-10-CM | POA: Diagnosis present

## 2023-07-13 DIAGNOSIS — Z7902 Long term (current) use of antithrombotics/antiplatelets: Secondary | ICD-10-CM

## 2023-07-13 DIAGNOSIS — D72829 Elevated white blood cell count, unspecified: Secondary | ICD-10-CM | POA: Diagnosis present

## 2023-07-13 DIAGNOSIS — F32A Depression, unspecified: Secondary | ICD-10-CM | POA: Diagnosis present

## 2023-07-13 DIAGNOSIS — I255 Ischemic cardiomyopathy: Secondary | ICD-10-CM | POA: Insufficient documentation

## 2023-07-13 DIAGNOSIS — I2510 Atherosclerotic heart disease of native coronary artery without angina pectoris: Secondary | ICD-10-CM | POA: Diagnosis present

## 2023-07-13 DIAGNOSIS — J441 Chronic obstructive pulmonary disease with (acute) exacerbation: Secondary | ICD-10-CM | POA: Diagnosis not present

## 2023-07-13 DIAGNOSIS — R0602 Shortness of breath: Secondary | ICD-10-CM | POA: Diagnosis not present

## 2023-07-13 DIAGNOSIS — Z79899 Other long term (current) drug therapy: Secondary | ICD-10-CM

## 2023-07-13 DIAGNOSIS — Z452 Encounter for adjustment and management of vascular access device: Secondary | ICD-10-CM | POA: Diagnosis not present

## 2023-07-13 DIAGNOSIS — R5381 Other malaise: Secondary | ICD-10-CM | POA: Diagnosis present

## 2023-07-13 DIAGNOSIS — I5043 Acute on chronic combined systolic (congestive) and diastolic (congestive) heart failure: Secondary | ICD-10-CM | POA: Diagnosis not present

## 2023-07-13 DIAGNOSIS — R112 Nausea with vomiting, unspecified: Secondary | ICD-10-CM | POA: Diagnosis present

## 2023-07-13 DIAGNOSIS — N170 Acute kidney failure with tubular necrosis: Secondary | ICD-10-CM | POA: Diagnosis not present

## 2023-07-13 DIAGNOSIS — R0989 Other specified symptoms and signs involving the circulatory and respiratory systems: Secondary | ICD-10-CM | POA: Diagnosis not present

## 2023-07-13 DIAGNOSIS — J9601 Acute respiratory failure with hypoxia: Secondary | ICD-10-CM | POA: Diagnosis present

## 2023-07-13 DIAGNOSIS — I10 Essential (primary) hypertension: Secondary | ICD-10-CM | POA: Diagnosis present

## 2023-07-13 DIAGNOSIS — E871 Hypo-osmolality and hyponatremia: Secondary | ICD-10-CM | POA: Diagnosis present

## 2023-07-13 DIAGNOSIS — I2582 Chronic total occlusion of coronary artery: Secondary | ICD-10-CM | POA: Diagnosis present

## 2023-07-13 DIAGNOSIS — I110 Hypertensive heart disease with heart failure: Secondary | ICD-10-CM | POA: Diagnosis not present

## 2023-07-13 DIAGNOSIS — Z87442 Personal history of urinary calculi: Secondary | ICD-10-CM

## 2023-07-13 DIAGNOSIS — Z974 Presence of external hearing-aid: Secondary | ICD-10-CM

## 2023-07-13 DIAGNOSIS — K5730 Diverticulosis of large intestine without perforation or abscess without bleeding: Secondary | ICD-10-CM | POA: Diagnosis not present

## 2023-07-13 DIAGNOSIS — H9190 Unspecified hearing loss, unspecified ear: Secondary | ICD-10-CM | POA: Diagnosis present

## 2023-07-13 DIAGNOSIS — I5084 End stage heart failure: Secondary | ICD-10-CM | POA: Diagnosis present

## 2023-07-13 DIAGNOSIS — G4733 Obstructive sleep apnea (adult) (pediatric): Secondary | ICD-10-CM | POA: Diagnosis present

## 2023-07-13 DIAGNOSIS — I509 Heart failure, unspecified: Secondary | ICD-10-CM | POA: Diagnosis not present

## 2023-07-13 DIAGNOSIS — J90 Pleural effusion, not elsewhere classified: Secondary | ICD-10-CM | POA: Diagnosis not present

## 2023-07-13 DIAGNOSIS — J84112 Idiopathic pulmonary fibrosis: Secondary | ICD-10-CM | POA: Diagnosis present

## 2023-07-13 DIAGNOSIS — R000 Tachycardia, unspecified: Secondary | ICD-10-CM | POA: Diagnosis present

## 2023-07-13 DIAGNOSIS — R579 Shock, unspecified: Secondary | ICD-10-CM | POA: Diagnosis not present

## 2023-07-13 DIAGNOSIS — R0902 Hypoxemia: Secondary | ICD-10-CM | POA: Diagnosis present

## 2023-07-13 DIAGNOSIS — Z7982 Long term (current) use of aspirin: Secondary | ICD-10-CM

## 2023-07-13 DIAGNOSIS — R109 Unspecified abdominal pain: Secondary | ICD-10-CM | POA: Diagnosis not present

## 2023-07-13 LAB — I-STAT CHEM 8, ED
BUN: 30 mg/dL — ABNORMAL HIGH (ref 8–23)
Calcium, Ion: 1.09 mmol/L — ABNORMAL LOW (ref 1.15–1.40)
Chloride: 102 mmol/L (ref 98–111)
Creatinine, Ser: 1 mg/dL (ref 0.61–1.24)
Glucose, Bld: 125 mg/dL — ABNORMAL HIGH (ref 70–99)
HCT: 44 % (ref 39.0–52.0)
Hemoglobin: 15 g/dL (ref 13.0–17.0)
Potassium: 4.9 mmol/L (ref 3.5–5.1)
Sodium: 136 mmol/L (ref 135–145)
TCO2: 20 mmol/L — ABNORMAL LOW (ref 22–32)

## 2023-07-13 LAB — COMPREHENSIVE METABOLIC PANEL WITH GFR
ALT: 199 U/L — ABNORMAL HIGH (ref 0–44)
AST: 292 U/L — ABNORMAL HIGH (ref 15–41)
Albumin: 3.6 g/dL (ref 3.5–5.0)
Alkaline Phosphatase: 80 U/L (ref 38–126)
Anion gap: 16 — ABNORMAL HIGH (ref 5–15)
BUN: 27 mg/dL — ABNORMAL HIGH (ref 8–23)
CO2: 21 mmol/L — ABNORMAL LOW (ref 22–32)
Calcium: 9.2 mg/dL (ref 8.9–10.3)
Chloride: 101 mmol/L (ref 98–111)
Creatinine, Ser: 1.09 mg/dL (ref 0.61–1.24)
GFR, Estimated: >60 mL/min (ref >60)
Glucose, Bld: 127 mg/dL — ABNORMAL HIGH (ref 70–99)
Potassium: 4.9 mmol/L (ref 3.5–5.1)
Sodium: 138 mmol/L (ref 135–145)
Total Bilirubin: 1.7 mg/dL — ABNORMAL HIGH (ref 0.0–1.2)
Total Protein: 6.6 g/dL (ref 6.5–8.1)

## 2023-07-13 LAB — CBC
HCT: 42.3 % (ref 39.0–52.0)
Hemoglobin: 13.7 g/dL (ref 13.0–17.0)
MCH: 35 pg — ABNORMAL HIGH (ref 26.0–34.0)
MCHC: 32.4 g/dL (ref 30.0–36.0)
MCV: 108.2 fL — ABNORMAL HIGH (ref 80.0–100.0)
Platelets: 295 K/uL (ref 150–400)
RBC: 3.91 MIL/uL — ABNORMAL LOW (ref 4.22–5.81)
RDW: 14.6 % (ref 11.5–15.5)
WBC: 14.3 K/uL — ABNORMAL HIGH (ref 4.0–10.5)
nRBC: 1 % — ABNORMAL HIGH (ref 0.0–0.2)

## 2023-07-13 LAB — HEPATITIS PANEL, ACUTE
HCV Ab: NONREACTIVE
Hep A IgM: NONREACTIVE
Hep B C IgM: NONREACTIVE
Hepatitis B Surface Ag: NONREACTIVE

## 2023-07-13 LAB — I-STAT CG4 LACTIC ACID, ED: Lactic Acid, Venous: 2.8 mmol/L (ref 0.5–1.9)

## 2023-07-13 LAB — MAGNESIUM: Magnesium: 2.2 mg/dL (ref 1.7–2.4)

## 2023-07-13 LAB — LIPASE, BLOOD: Lipase: 45 U/L (ref 11–51)

## 2023-07-13 LAB — BRAIN NATRIURETIC PEPTIDE: B Natriuretic Peptide: 1339.7 pg/mL — ABNORMAL HIGH (ref 0.0–100.0)

## 2023-07-13 MED ORDER — ACETAMINOPHEN 325 MG PO TABS: 650.0000 mg | ORAL_TABLET | Freq: Four times a day (QID) | ORAL | Status: DC | PRN

## 2023-07-13 MED ORDER — BUPROPION HCL ER (XL) 150 MG PO TB24
150.0000 mg | ORAL_TABLET | Freq: Every morning | ORAL | Status: DC
  Administered 2023-07-14 – 2023-07-18 (×5): 150 mg via ORAL
  Filled 2023-07-13 (×5): qty 1

## 2023-07-13 MED ORDER — HEPARIN BOLUS VIA INFUSION
4500.0000 [IU] | Freq: Once | INTRAVENOUS | Status: AC
  Administered 2023-07-13: 4500 [IU] via INTRAVENOUS
  Filled 2023-07-13: qty 4500

## 2023-07-13 MED ORDER — SODIUM CHLORIDE 0.9% FLUSH
10.0000 mL | Freq: Two times a day (BID) | INTRAVENOUS | Status: DC
  Administered 2023-07-14 – 2023-07-15 (×4): 10 mL
  Administered 2023-07-16: 20 mL
  Administered 2023-07-16 – 2023-07-17 (×2): 10 mL
  Administered 2023-07-17: 20 mL
  Administered 2023-07-18 – 2023-07-20 (×5): 10 mL

## 2023-07-13 MED ORDER — SODIUM CHLORIDE 0.9% FLUSH
3.0000 mL | Freq: Two times a day (BID) | INTRAVENOUS | Status: DC
  Administered 2023-07-14 – 2023-07-19 (×11): 3 mL via INTRAVENOUS

## 2023-07-13 MED ORDER — ACETAMINOPHEN 650 MG RE SUPP: 650.0000 mg | Freq: Four times a day (QID) | RECTAL | Status: DC | PRN

## 2023-07-13 MED ORDER — SODIUM CHLORIDE 0.9% FLUSH: 10.0000 mL | INTRAVENOUS | Status: DC | PRN

## 2023-07-13 MED ORDER — BUDESONIDE 0.5 MG/2ML IN SUSP
0.5000 mg | Freq: Two times a day (BID) | RESPIRATORY_TRACT | Status: DC
  Administered 2023-07-13 – 2023-07-20 (×12): 0.5 mg via RESPIRATORY_TRACT
  Filled 2023-07-13 (×14): qty 2

## 2023-07-13 MED ORDER — FUROSEMIDE 10 MG/ML IJ SOLN
40.0000 mg | Freq: Once | INTRAMUSCULAR | Status: AC
  Administered 2023-07-13: 40 mg via INTRAVENOUS
  Filled 2023-07-13: qty 4

## 2023-07-13 MED ORDER — ROSUVASTATIN CALCIUM 20 MG PO TABS
20.0000 mg | ORAL_TABLET | Freq: Every day | ORAL | Status: DC
  Administered 2023-07-14: 20 mg via ORAL
  Filled 2023-07-13: qty 1

## 2023-07-13 MED ORDER — HEPARIN (PORCINE) 25000 UT/250ML-% IV SOLN
1600.0000 [IU]/h | INTRAVENOUS | Status: DC
  Administered 2023-07-13: 1300 [IU]/h via INTRAVENOUS
  Administered 2023-07-14 – 2023-07-16 (×4): 1600 [IU]/h via INTRAVENOUS
  Filled 2023-07-13 (×5): qty 250

## 2023-07-13 MED ORDER — PIRFENIDONE 267 MG PO TABS
801.0000 mg | ORAL_TABLET | Freq: Three times a day (TID) | ORAL | Status: DC
  Administered 2023-07-14 – 2023-07-18 (×13): 801 mg via ORAL
  Filled 2023-07-13 (×19): qty 3

## 2023-07-13 MED ORDER — CHLORHEXIDINE GLUCONATE CLOTH 2 % EX PADS
6.0000 | MEDICATED_PAD | Freq: Every day | CUTANEOUS | Status: DC
  Administered 2023-07-14 – 2023-07-18 (×5): 6 via TOPICAL

## 2023-07-13 MED ORDER — ASPIRIN 81 MG PO CHEW: 81.0000 mg | CHEWABLE_TABLET | Freq: Every day | ORAL | Status: DC

## 2023-07-13 MED ORDER — FUROSEMIDE 10 MG/ML IJ SOLN
80.0000 mg | Freq: Two times a day (BID) | INTRAMUSCULAR | Status: DC
  Administered 2023-07-13 – 2023-07-14 (×2): 80 mg via INTRAVENOUS
  Filled 2023-07-13 (×2): qty 8

## 2023-07-13 MED ORDER — ONDANSETRON HCL 4 MG/2ML IJ SOLN
4.0000 mg | Freq: Three times a day (TID) | INTRAMUSCULAR | Status: DC | PRN
  Administered 2023-07-15 – 2023-07-17 (×2): 4 mg via INTRAVENOUS
  Filled 2023-07-13 (×2): qty 2

## 2023-07-13 MED ORDER — POLYETHYLENE GLYCOL 3350 17 G PO PACK
17.0000 g | PACK | Freq: Every day | ORAL | Status: DC | PRN
Start: 2023-07-13 — End: 2023-07-20

## 2023-07-13 MED ORDER — PANTOPRAZOLE SODIUM 40 MG PO TBEC
40.0000 mg | DELAYED_RELEASE_TABLET | Freq: Every day | ORAL | Status: DC
  Administered 2023-07-14 – 2023-07-18 (×5): 40 mg via ORAL
  Filled 2023-07-13 (×7): qty 1

## 2023-07-13 MED ORDER — ONDANSETRON HCL 4 MG/2ML IJ SOLN
4.0000 mg | Freq: Once | INTRAMUSCULAR | Status: AC
  Administered 2023-07-13: 4 mg via INTRAVENOUS
  Filled 2023-07-13: qty 2

## 2023-07-13 MED ORDER — CLOPIDOGREL BISULFATE 75 MG PO TABS
75.0000 mg | ORAL_TABLET | Freq: Every day | ORAL | Status: DC
  Administered 2023-07-14 – 2023-07-18 (×5): 75 mg via ORAL
  Filled 2023-07-13 (×5): qty 1

## 2023-07-13 MED ORDER — ALBUTEROL SULFATE (2.5 MG/3ML) 0.083% IN NEBU
2.5000 mg | INHALATION_SOLUTION | Freq: Four times a day (QID) | RESPIRATORY_TRACT | Status: DC | PRN
  Filled 2023-07-13: qty 3

## 2023-07-13 MED ORDER — LACTATED RINGERS IV BOLUS
250.0000 mL | Freq: Once | INTRAVENOUS | Status: AC
  Administered 2023-07-13: 250 mL via INTRAVENOUS

## 2023-07-13 MED ORDER — AMIODARONE HCL IN DEXTROSE 360-4.14 MG/200ML-% IV SOLN
60.0000 mg/h | INTRAVENOUS | Status: DC
  Administered 2023-07-13: 30 mg/h via INTRAVENOUS
  Administered 2023-07-14 (×2): 60 mg/h via INTRAVENOUS
  Administered 2023-07-14: 30 mg/h via INTRAVENOUS
  Administered 2023-07-14 – 2023-07-16 (×6): 60 mg/h via INTRAVENOUS
  Administered 2023-07-16: 30 mg/h via INTRAVENOUS
  Administered 2023-07-16 – 2023-07-20 (×11): 60 mg/h via INTRAVENOUS
  Filled 2023-07-13 (×23): qty 200

## 2023-07-13 MED ORDER — AMIODARONE HCL IN DEXTROSE 360-4.14 MG/200ML-% IV SOLN
60.0000 mg/h | INTRAVENOUS | Status: AC
  Administered 2023-07-13 (×2): 60 mg/h via INTRAVENOUS
  Filled 2023-07-13 (×2): qty 200

## 2023-07-13 MED ORDER — EZETIMIBE 10 MG PO TABS
10.0000 mg | ORAL_TABLET | Freq: Every day | ORAL | Status: DC
  Administered 2023-07-14 – 2023-07-18 (×5): 10 mg via ORAL
  Filled 2023-07-13 (×5): qty 1

## 2023-07-13 MED ORDER — AMIODARONE LOAD VIA INFUSION
150.0000 mg | Freq: Once | INTRAVENOUS | Status: AC
  Administered 2023-07-13: 150 mg via INTRAVENOUS
  Filled 2023-07-13: qty 83.34

## 2023-07-13 NOTE — Telephone Encounter (Signed)
 Patient Product/process development scientist completed.    The patient is insured through [employer]. Patient has Medicare and is not eligible for a copay card, but may be able to apply for patient assistance or Medicare RX Payment Plan (Patient Must reach out to their plan, if eligible for payment plan), if available.    Ran test claim for Eliquis 5 mg and the current 30 day co-pay is $0.00.  Ran test claim for Xarelto 20 mg and the current 30 day co-pay is $0.00.  This test claim was processed through [facility]- copay amounts may vary at other pharmacies due to pharmacy/plan contracts, or as the patient moves through the different stages of their insurance plan.     Roland Earl, CPHT Pharmacy Technician III Certified Patient Advocate [facility] Patient Advocate Team Direct Number: [phone]  Fax: [phone]

## 2023-07-13 NOTE — ED Triage Notes (Signed)
 Wife stated, they sent him up here due to A-Fib/ Pt stated, He has been nausea for several days. Denies any other symptoms.

## 2023-07-13 NOTE — ED Notes (Signed)
Patient placed onto cardiac monitor

## 2023-07-13 NOTE — Progress Notes (Signed)
 Paramedicine Encounter   Patient ID: Jose Hines , male,   DOB: [birth year],77 y.o.,  MRN: [id number]   Met patient in clinic today with provider.   Tzphyu@ clinic- 205lb B/P- 100/60  P- [id number]  Today I attended [facility] appt w/ provider Jose Hines.  Pt c/o nausea w/o vomiting. Poor oral intake.  EKG ordered and 12-lead showed A-Fib w/ RVR.  Jose Hines sent to ED for admission due to new onset of A-Fib and GI compliants.  Pt. Denies chest pain.  He is SOB.   Will follow up once he has been discharged.   I did take him down to the ED via wheelchair and released to staff for admission.  Med changes today (if any) BETHA Mary Sharps, EMT-Paramedic [phone] 07/13/2023

## 2023-07-13 NOTE — ED Provider Notes (Signed)
 [place] EMERGENCY DEPARTMENT AT [facility] Provider Note   CSN: [id number] Arrival date & time: 07/13/23  9095     Patient presents with: Atrial Fibrillation and Nausea   Tavyn Oisin Yoakum. is a 78 y.o. male.   Patient is a 78 year old male who presents with tachycardia.  He has a history of ischemic cardiomyopathy, coronary artery disease, pulmonary fibrosis.  He was admitted on June 25 to have PCI of the LAD.  Shortly after that procedure, he became hypotensive and developed a hemorrhagic pericardial effusion.  He underwent emergent pericardiocentesis and ultimately had a pericardial drain placed which is subsequently been removed.  He is noted to have advanced heart failure with an EF of 20 to 25%.  He went for a follow-up appointment today to the cardiology office and was noted to be in A-fib with RVR with heart rates in the 140s.  He reports that he has had some nausea vomiting diarrhea for the last 3 days.  His family member at bedside states he has not been able to keep anything down during that time.  He denies any specific abdominal pain.  No fevers.  No obvious blood in his stool.  No history of atrial fibrillation in the past.  Cardiology evaluated the patient in office this morning and sent him here for treatment/admission.  They recommend starting the patient on amiodarone  rather than diltiazem or a beta-blocker given that he has had soft blood pressures.       Prior to Admission medications   Medication Sig Start Date End Date Taking? Authorizing Provider  albuterol  (PROVENTIL  HFA;VENTOLIN  HFA) 108 (90 Base) MCG/ACT inhaler Inhale 1-2 puffs into the lungs every 6 (six) hours as needed for wheezing or shortness of breath. 12/24/16   Midge Golas, MD  albuterol  (PROVENTIL ) (2.5 MG/3ML) 0.083% nebulizer solution Take 3 mLs (2.5 mg total) by nebulization every 6 (six) hours as needed for wheezing or shortness of breath. 08/02/18   Olalere, Adewale A, MD  allopurinol   (ZYLOPRIM ) 300 MG tablet Take 300 mg by mouth daily. Patient not taking: Reported on 07/13/2023    [provider]  aspirin  81 MG tablet Take 81 mg by mouth daily. Patient not taking: Reported on 07/13/2023    [provider]  bisoprolol  2.5 MG TABS Take 2.5 mg by mouth daily. Patient not taking: Reported on 07/13/2023 07/04/23   Strader, Brittany M, PA-C  budesonide  (PULMICORT ) 0.5 MG/2ML nebulizer solution INHALE 2 MILLILITERS VIA NEBULIZATION BY MOUTH 2 TIMES A DAY Patient not taking: Reported on 07/13/2023 06/22/23   Neda Jennet LABOR, MD  buPROPion  (WELLBUTRIN  XL) 150 MG 24 hr tablet Take 150 mg by mouth every morning. Patient not taking: Reported on 07/13/2023    [provider]  clopidogrel  (PLAVIX ) 75 MG tablet Take 1 tablet (75 mg total) by mouth daily. Patient not taking: Reported on 07/13/2023 07/04/23   Johnson Laymon HERO, PA-C  empagliflozin  (JARDIANCE ) 10 MG TABS tablet Take 1 tablet (10 mg total) by mouth daily. Patient not taking: Reported on 07/13/2023 07/04/23   Johnson Laymon HERO, PA-C  ezetimibe  (ZETIA ) 10 MG tablet Take 10 mg by mouth daily. Patient not taking: Reported on 07/13/2023    [provider]  mexiletine (MEXITIL ) 150 MG capsule Take 1 capsule (150 mg total) by mouth every 8 (eight) hours. Patient not taking: Reported on 07/13/2023 07/04/23   Johnson Laymon HERO, PA-C  nitroGLYCERIN  (NITROSTAT ) 0.4 MG SL tablet Place 1 tablet (0.4 mg total) under the  tongue every 5 (five) minutes x 3 doses as needed for chest pain. 07/04/23   Strader, Laymon HERO, PA-C  pantoprazole  (PROTONIX ) 40 MG tablet Take 1 tablet (40 mg total) by mouth daily before breakfast. Patient not taking: Reported on 07/13/2023 06/23/23   Ladona Heinz, MD  Pirfenidone  267 MG TABS Take 3 tablets (801 mg total) by mouth 3 (three) times daily with meals. Patient not taking: Reported on 07/13/2023 02/25/23   Ramaswamy, Murali, MD  polyethylene glycol (MIRALAX  / GLYCOLAX ) 17 g packet Take 17 g by  mouth daily as needed for mild constipation. Patient not taking: Reported on 07/13/2023 04/25/23   Rosario Eland I, MD  rosuvastatin  (CRESTOR ) 20 MG tablet Take 20 mg by mouth daily. Patient not taking: Reported on 07/13/2023 03/23/23   Ladona Heinz, MD  spironolactone  (ALDACTONE ) 25 MG tablet Take 0.5 tablets (12.5 mg total) by mouth daily. Patient not taking: Reported on 07/13/2023 07/04/23   Johnson Laymon HERO, PA-C    Allergies: Patient has no known allergies.    Review of Systems  Constitutional:  Positive for fatigue. Negative for chills, diaphoresis and fever.  HENT:  Negative for congestion, rhinorrhea and sneezing.   Eyes: Negative.   Respiratory:  Negative for cough, chest tightness and shortness of breath.   Cardiovascular:  Positive for palpitations. Negative for chest pain and leg swelling.  Gastrointestinal:  Positive for diarrhea, nausea and vomiting. Negative for abdominal pain and blood in stool.  Genitourinary:  Negative for difficulty urinating, flank pain, frequency and hematuria.  Musculoskeletal:  Negative for arthralgias and back pain.  Skin:  Negative for rash.  Neurological:  Negative for dizziness, speech difficulty, weakness, numbness and headaches.    Updated Vital Signs BP 102/75   Pulse (!) 50   Temp 97.8 F (36.6 C)   Resp (!) 27   SpO2 95%   Physical Exam Constitutional:      Appearance: He is well-developed.  HENT:     Head: Normocephalic and atraumatic.  Eyes:     Pupils: Pupils are equal, round, and reactive to light.  Cardiovascular:     Rate and Rhythm: Regular rhythm. Tachycardia present.     Heart sounds: Normal heart sounds.  Pulmonary:     Effort: Pulmonary effort is normal. No respiratory distress.     Breath sounds: Normal breath sounds. No wheezing or rales.  Chest:     Chest wall: No tenderness.  Abdominal:     General: Bowel sounds are normal.     Palpations: Abdomen is soft.     Tenderness: There is no abdominal tenderness.  There is no guarding or rebound.  Musculoskeletal:        General: Normal range of motion.     Cervical back: Normal range of motion and neck supple.  Lymphadenopathy:     Cervical: No cervical adenopathy.  Skin:    General: Skin is warm and dry.     Findings: No rash.  Neurological:     Mental Status: He is alert and oriented to person, place, and time.     (all labs ordered are listed, but only abnormal results are displayed) Labs Reviewed  CBC - Abnormal; Notable for the following components:      Result Value   WBC 14.3 (*)    RBC 3.91 (*)    MCV 108.2 (*)    MCH 35.0 (*)    nRBC 1.0 (*)    All other components within normal limits  COMPREHENSIVE METABOLIC PANEL  WITH GFR - Abnormal; Notable for the following components:   CO2 21 (*)    Glucose, Bld 127 (*)    BUN 27 (*)    AST 292 (*)    ALT 199 (*)    Total Bilirubin 1.7 (*)    Anion gap 16 (*)    All other components within normal limits  BRAIN NATRIURETIC PEPTIDE - Abnormal; Notable for the following components:   B Natriuretic Peptide 1,339.7 (*)    All other components within normal limits  I-STAT CHEM 8, ED - Abnormal; Notable for the following components:   BUN 30 (*)    Glucose, Bld 125 (*)    Calcium , Ion 1.09 (*)    TCO2 20 (*)    All other components within normal limits  LIPASE, BLOOD  MAGNESIUM   HEPATITIS PANEL, ACUTE    EKG: None  Radiology: CT ABDOMEN PELVIS WO CONTRAST Result Date: 07/13/2023 CLINICAL DATA:  Abdominal pain and vomiting.  Elevated LFTs. EXAM: CT ABDOMEN AND PELVIS WITHOUT CONTRAST TECHNIQUE: Multidetector CT imaging of the abdomen and pelvis was performed following the standard protocol without IV contrast. RADIATION DOSE REDUCTION: This exam was performed according to the departmental dose-optimization program which includes automated exposure control, adjustment of the mA and/or kV according to patient size and/or use of iterative reconstruction technique. COMPARISON:  CT  abdomen pelvis dated 06/29/2023. FINDINGS: Evaluation of this exam is limited in the absence of intravenous contrast. Lower chest: Small bilateral pleural effusions with partial compressive atelectasis of the lower lobes. There is 3 vessel coronary vascular calcification. No intra-abdominal free air or free fluid. Hepatobiliary: Fatty liver. No biliary dilatation. The gallbladder is unremarkable. Pancreas: Unremarkable. No pancreatic ductal dilatation or surrounding inflammatory changes. Spleen: Normal in size without focal abnormality. Adrenals/Urinary Tract: The adrenal glands are unremarkable the kidneys, visualized ureters, and urinary bladder appear unremarkable. Stomach/Bowel: There is sigmoid diverticulosis. There is no bowel obstruction or active inflammation. The appendix is normal. Vascular/Lymphatic: Advanced aortoiliac atherosclerotic disease. The IVC is unremarkable. No portal venous gas. There is no adenopathy. Reproductive: The prostate and seminal vesicles are grossly unremarkable. Other: None Musculoskeletal: Osteopenia with degenerative changes. Similar appearance of L3 compression fracture. No acute osseous pathology. IMPRESSION: 1. No acute intra-abdominal or pelvic pathology. 2. Sigmoid diverticulosis. No bowel obstruction. Normal appendix. 3. Fatty liver. 4. Small bilateral pleural effusions with partial compressive atelectasis of the lower lobes. 5.  Aortic Atherosclerosis (ICD10-I70.0). Electronically Signed   By: Vanetta Chou M.D.   On: 07/13/2023 12:34   DG Chest 1 View Result Date: 07/13/2023 CLINICAL DATA:  Atrial fibrillation EXAM: CHEST  1 VIEW COMPARISON:  Chest x-ray performed July 01, 2023 FINDINGS: Heart is enlarged. Interstitial prominence. A small right pleural effusion with associated consolidation is present. Minimal blunting of the left costophrenic angle. No pneumothorax. IMPRESSION: 1. Small right pleural effusion with associated consolidation. 2. Trace left pleural  effusion. 3. Interstitial prominence, possibly sequelae of pulmonary vascular congestion. Electronically Signed   By: Maude Naegeli M.D.   On: 07/13/2023 10:23     Procedures   Medications Ordered in the ED  amiodarone  (NEXTERONE ) 1.8 mg/mL load via infusion 150 mg (150 mg Intravenous Bolus from Bag 07/13/23 1150)    Followed by  amiodarone  (NEXTERONE  PREMIX) 360-4.14 MG/200ML-% (1.8 mg/mL) IV infusion (60 mg/hr Intravenous New Bag/Given 07/13/23 1150)    Followed by  amiodarone  (NEXTERONE  PREMIX) 360-4.14 MG/200ML-% (1.8 mg/mL) IV infusion (has no administration in time range)  furosemide  (LASIX ) injection 40 mg (  has no administration in time range)  lactated ringers  bolus 250 mL (0 mLs Intravenous Stopped 07/13/23 1151)  ondansetron  (ZOFRAN ) injection 4 mg (4 mg Intravenous Given 07/13/23 1141)                                    Medical Decision Making Amount and/or Complexity of Data Reviewed Labs: ordered. Radiology: ordered.  Risk Prescription drug management. Decision regarding hospitalization.   This patient presents to the ED for concern of atrial fibrillation, weakness, vomiting, this involves an extensive number of treatment options, and is a complaint that carries with it a high risk of complications and morbidity.  I considered the following differential and admission for this acute, potentially life threatening condition.  The differential diagnosis includes gastroenteritis, dehydration, arrhythmia, ACS, CHF  MDM:    Patient presents with tachycardia.  He is in atrial fibrillation with RVR.  He was started on amiodarone .  His heart rate is improving on the amiodarone .  His electrolytes are nonconcerning.  His LFTs are elevated.  CT of his abdomen pelvis do not show any acute abnormality.  He LFTs are elevated.  His BNP is elevated.  His breathing came a little bit more labored while he was here and he became a little bit hypoxic.  Chest x-ray shows some evidence of pulmonary  edema.  Was started on IV Lasix .  Cardiology has been consulted and will see the patient.  Discussed with Dr. Seena he will admit the patient.  CRITICAL CARE Performed by: Andrea Ness Total critical care time: 70 minutes Critical care time was exclusive of separately billable procedures and treating other patients. Critical care was necessary to treat or prevent imminent or life-threatening deterioration. Critical care was time spent personally by me on the following activities: development of treatment plan with patient and/or surrogate as well as nursing, discussions with consultants, evaluation of patient's response to treatment, examination of patient, obtaining history from patient or surrogate, ordering and performing treatments and interventions, ordering and review of laboratory studies, ordering and review of radiographic studies, pulse oximetry and re-evaluation of patient's condition.   (Labs, imaging, consults)  Labs: I Ordered, and personally interpreted labs.  The pertinent results include: Elevated BNP, elevated LFTs  Imaging Studies ordered: I ordered imaging studies including chest x-ray I independently visualized and interpreted imaging. I agree with the radiologist interpretation  Additional history obtained from chart review.  External records from outside source obtained and reviewed including prior notes  Cardiac Monitoring: The patient was maintained on a cardiac monitor.  If on the cardiac monitor, I personally viewed and interpreted the cardiac monitored which showed an underlying rhythm of: Atrial fibrillation with RVR  Reevaluation: After the interventions noted above, I reevaluated the patient and found that they have :improved  Social Determinants of Health:    Disposition: Admit to hospital  Co morbidities that complicate the patient evaluation  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   Alcoholic polyneuropathy  (HCC)    Allergy    Asthma    BPH (benign prostatic hyperplasia)    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 failure) (HCC) 04/23/2023   COPD (chronic obstructive pulmonary disease) (HCC)    Depression    Depression    Diverticulosis    Dyspnea on exertion    Elevated LDL cholesterol level    GERD with stricture  Gout    Hiatal hernia    Hypertension    IPF (idiopathic pulmonary fibrosis) (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/23/2023   Kidney stones    2 times - passed stones, no surgery required   Lumbar and sacral osteoarthritis    Mitral regurgitation 04/23/2023   OSA on CPAP 04/23/2023   Peripheral polyneuropathy    Sleep apnea    wears CPAP   Wears hearing aid    Bil     Medicines Meds ordered this encounter  Medications   lactated ringers  bolus 250 mL   ondansetron  (ZOFRAN ) injection 4 mg   FOLLOWED BY Linked Order Group    amiodarone  (NEXTERONE ) 1.8 mg/mL load via infusion 150 mg    amiodarone  (NEXTERONE  PREMIX) 360-4.14 MG/200ML-% (1.8 mg/mL) IV infusion    amiodarone  (NEXTERONE  PREMIX) 360-4.14 MG/200ML-% (1.8 mg/mL) IV infusion   furosemide  (LASIX ) injection 40 mg    I have reviewed the patients home medicines and have made adjustments as needed  Problem List / ED Course: Problem List Items Addressed This Visit   None Visit Diagnoses       Atrial fibrillation with RVR (HCC)    -  Primary   Relevant Medications   amiodarone  (NEXTERONE ) 1.8 mg/mL load via infusion 150 mg (Completed)   amiodarone  (NEXTERONE  PREMIX) 360-4.14 MG/200ML-% (1.8 mg/mL) IV infusion   amiodarone  (NEXTERONE  PREMIX) 360-4.14 MG/200ML-% (1.8 mg/mL) IV infusion (Start on 07/13/2023  5:15 PM)   furosemide  (LASIX ) injection 40 mg (Start on 07/13/2023  1:15 PM)     Nausea vomiting and diarrhea         Transaminitis                    Final diagnoses:  Atrial fibrillation with RVR (HCC)  Nausea vomiting and diarrhea  Transaminitis    ED Discharge Orders     None           Lenor Hollering, MD 07/13/23 [phone]

## 2023-07-13 NOTE — Progress Notes (Signed)
 Peripherally Inserted Central Catheter Placement  The IV Nurse has discussed with the patient and/or persons authorized to consent for the patient, the purpose of this procedure and the potential benefits and risks involved with this procedure.  The benefits include less needle sticks, lab draws from the catheter, and the patient may be discharged home with the catheter. Risks include, but not limited to, infection, bleeding, blood clot (thrombus formation), and puncture of an artery; nerve damage and irregular heartbeat and possibility to perform a PICC exchange if needed/ordered by physician.  Alternatives to this procedure were also discussed.  Bard Power PICC patient education guide, fact sheet on infection prevention and patient information card has been provided to patient /or left at bedside.    PICC Placement Documentation  PICC Triple Lumen 07/13/23 Right Basilic 43 cm 0 cm (Active)  Indication for Insertion or Continuance of Line Vasoactive infusions 07/13/23 1813  Exposed Catheter (cm) 0 cm 07/13/23 1813  Site Assessment Clean, Dry, Intact 07/13/23 1813  Lumen #1 Status Flushed;Blood return noted;Saline locked 07/13/23 1813  Lumen #2 Status Flushed;Blood return noted;Saline locked 07/13/23 1813  Lumen #3 Status Flushed;Blood return noted;Saline locked 07/13/23 1813  Dressing Type Transparent 07/13/23 1813  Dressing Status Antimicrobial disc/dressing in place 07/13/23 1813  Line Care Connections checked and tightened 07/13/23 1813  Line Adjustment (NICU/IV Team Only) No 07/13/23 1813  Dressing Intervention New dressing 07/13/23 1813  Dressing Change Due 08/05/2023 07/13/23 1813       Jose Hines 07/13/2023, 6:15 PM

## 2023-07-13 NOTE — Consult Note (Addendum)
 Cardiology Consultation   Patient ID: Sheddrick O Clavijo Jr. MRN: [id number]; DOB: March 18, 1945  Admit date: 07/13/2023 Date of Consult: 07/13/2023  PCP:  Seabron Lenis, MD   [facility] Providers Cardiologist:  Gordy Bergamo, MD  Advanced Heart Failure:  Ria Commander, DO     Patient Profile: Karlo Goeden. is a 78 y.o. male with a hx of chronic systolic heart failure (LVEF 20 to 25%), CAD with recent PCI of proximal LAD complicated by perforation and pericardial effusion, severe MR, idiopathic pulmonary fibrosis who is being seen 07/13/2023 for the evaluation of atrial fibrillation with RVR at the request of Dr. Lenor.  History of Present Illness: Mr. Panepinto is a 78 year old male with past medical history noted above.  He has been followed by Dr. Bergamo Red and Dr. Commander outpatient.  Known history of ischemic cardiomyopathy, coronary artery disease with CTO of RCA and 80% proximal LAD stenosis noted on cardiac catheterization 11/2022.  Also known severe MR being followed by structural heart team for possible mTEER.  He was recently admitted 06/29/2023 for elective PCI of the LAD, and shortly after undergoing PCI became severely hypotensive.  Emergent echocardiogram showed severely reduced LVEF of less than 20% with enlarging pericardial effusion.  Due to need for rapid escalation of pressor requirements attempted emergent bedside pericardiocentesis which yielded only 20 to 50 cc of volume removal.  He was treated with IV fluids and taken back to the Cath Lab where a pericardial drain was placed with 400 cc of bloody fluid removed and immediate improvement in blood pressure.  He did remain hypotensive requiring pressor and inotropic support.  Was managed in the ICU for cardiogenic shock.  Repeat limited echo the following day showed resolution of pericardial effusion with LVEF of 20 to 25%.  Blood pressure did not improve allowing addition of GDMT prior to discharge.  Colchicine  was  initially added but later discontinued after development of nausea.   Seen in the clinic 7/8 for posthospital follow-up and complained of several days of nausea, vomiting and diarrhea with loss of appetite.  Reported inability to tolerate oral medications for a total of 48 hours.  EKG in the office showed atrial fibrillation with RVR.  Given his symptoms he was sent to the ED for further evaluation.  In the ED his labs showed sodium 138, potassium 4.9, creatinine 1.09, magnesium  2.2, AST 292 ALT 199, T. bili 1.7, BNP 1339, WBC 14.3, hemoglobin 13.7.  EKG in the ED showed continued atrial fibrillation with RVR.  CT abdomen pelvis with no acute intra-abdominal abnormality, sigmoid diverticulosis with no bowel obstruction, small bilateral pleural effusions.  Chest x-ray with small right pleural effusion with associated consolidation.  He was started on IV amiodarone  with 150 mg bolus in the ED.   In talking with patient, he has had nausea, vomiting and shortness of breath for a least 4 days. Has felt some palpitations. Has tried sips of water , but really unable to hold anything down. No sick contacts at home. Family reports he felt well for about 5 days after DC and then started to become weak, now in the bed for the past 4 days.   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   Alcoholic polyneuropathy (HCC)    Allergy    Asthma    BPH (benign prostatic hyperplasia)    Cardiogenic shock (HCC) 06/29/2023   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 failure) (HCC) 04/23/2023   COPD (  chronic obstructive pulmonary disease) (HCC)    Depression    Depression    Diverticulosis    Dyspnea on exertion    Elevated LDL cholesterol level    GERD with stricture    Gout    Hiatal hernia    Hypertension    Hypotension 06/29/2023   Hypotension after procedure 06/29/2023   IPF (idiopathic pulmonary fibrosis) (HCC) 04/23/2023   Kidney stones    2 times - passed  stones, no surgery required   Lumbar and sacral osteoarthritis    Mitral regurgitation 04/23/2023   OSA on CPAP 04/23/2023   Peripheral polyneuropathy    Shock (HCC) 07/01/2023   Sleep apnea    wears CPAP   Wears hearing aid    Bil    Past Surgical History:  Procedure Laterality Date   ARTERIAL LINE INSERTION Left 06/29/2023   Procedure: ARTERIAL LINE INSERTION;  Surgeon: Wonda Sharper, MD;  Location: [facility] INVASIVE CV LAB;  Service: Cardiovascular;  Laterality: Left;   CENTRAL LINE INSERTION Right 06/29/2023   Procedure: CENTRAL LINE INSERTION;  Surgeon: Wonda Sharper, MD;  Location: [facility] INVASIVE CV LAB;  Service: Cardiovascular;  Laterality: Right;   COLONOSCOPY     CORONARY ATHERECTOMY N/A 06/29/2023   Procedure: CORONARY ATHERECTOMY;  Surgeon: Ladona Heinz, MD;  Location[facility] INVASIVE CV LAB;  Service: Cardiovascular;  Laterality: N/A;   CORONARY STENT INTERVENTION N/A 06/29/2023   Procedure: CORONARY STENT INTERVENTION;  Surgeon: Ladona Heinz, MD;  Location: [facility] INVASIVE CV LAB;  Service: Cardiovascular;  Laterality: N/A;   INGUINAL HERNIA REPAIR     PERICARDIOCENTESIS N/A 06/29/2023   Procedure: PERICARDIOCENTESIS;  Surgeon: Wonda Sharper, MD;  Location: [facility] INVASIVE CV LAB;  Service: Cardiovascular;  Laterality: N/A;   POLYPECTOMY     RIGHT/LEFT HEART CATH AND CORONARY ANGIOGRAPHY N/A 11/19/2022   Procedure: RIGHT/LEFT HEART CATH AND CORONARY ANGIOGRAPHY;  Surgeon: Verlin Lonni BIRCH, MD;  Location: [facility] INVASIVE CV LAB;  Service: Cardiovascular;  Laterality: N/A;   TONSILLECTOMY AND ADENOIDECTOMY     TRANSESOPHAGEAL ECHOCARDIOGRAM (CATH LAB) N/A 05/18/2023   Procedure: TRANSESOPHAGEAL ECHOCARDIOGRAM;  Surgeon: Francyne Headland, MD;  Location: [facility] INVASIVE CV LAB;  Service: Cardiovascular;  Laterality: N/A;     Scheduled Meds:  budesonide   0.5 mg Nebulization BID   [START ON 07/14/2023] buPROPion   150 mg Oral q morning   clopidogrel   75 mg Oral Daily   [START ON 07/14/2023] ezetimibe    10 mg Oral Daily   [START ON 07/14/2023] pantoprazole   40 mg Oral QAC breakfast   Pirfenidone   801 mg Oral TID WC   [START ON 07/14/2023] rosuvastatin   20 mg Oral Daily   sodium chloride  flush  3 mL Intravenous Q12H   Continuous Infusions:  amiodarone  60 mg/hr (07/13/23 1150)   Followed by   amiodarone      PRN Meds: acetaminophen  **OR** acetaminophen , albuterol , ondansetron  (ZOFRAN ) IV, polyethylene glycol  Allergies:   No Known Allergies  Social History:   Social History   Socioeconomic History   Marital status: Married    Spouse name: Not on file   Number of children: 2   Years of education: Not on file   Highest education level: Not on file  Occupational History   Occupation: Engineer, site  Tobacco Use   Smoking status: Former    Current packs/day: 0.00    Average packs/day: 2.0 packs/day for 10.0 years (20.0 ttl pk-yrs)    Types: Cigarettes    Start date: 12/29/1958    Quit date: 12/28/1968  Years since quitting: 54.5   Smokeless tobacco: Never  Vaping Use   Vaping status: Never Used  Substance and Sexual Activity   Alcohol use: Yes    Alcohol/week: 7.0 - 14.0 standard drinks of alcohol    Types: 7 - 14 Standard drinks or equivalent per week    Comment: bourbon   Drug use: No   Sexual activity: Not on file  Other Topics Concern   Not on file  Social History Narrative   Not on file   Social Drivers of Health   Financial Resource Strain: Not on file  Food Insecurity: No Food Insecurity (06/29/2023)   Hunger Vital Sign    Worried About Running Out of Food in the Last Year: Never true    Ran Out of Food in the Last Year: Never true  Transportation Needs: No Transportation Needs (06/29/2023)   PRAPARE - Administrator, Civil Service (Medical): No    Lack of Transportation (Non-Medical): No  Physical Activity: Not on file  Stress: Not on file  Social Connections: Moderately Isolated (06/29/2023)   Social Connection and Isolation Panel    Frequency  of Communication with Friends and Family: More than three times a week    Frequency of Social Gatherings with Friends and Family: More than three times a week    Attends Religious Services: Never    Database administrator or Organizations: No    Attends Banker Meetings: Never    Marital Status: Married  Catering manager Violence: Not At Risk (06/29/2023)   Humiliation, Afraid, Rape, and Kick questionnaire    Fear of Current or Ex-Partner: No    Emotionally Abused: No    Physically Abused: No    Sexually Abused: No    Family History:    Family History  Problem Relation Age of Onset   Transient ischemic attack Mother    Heart disease Father 62   Other Son        car wreck   Colon cancer Neg Hx    Esophageal cancer Neg Hx    Rectal cancer Neg Hx    Stomach cancer Neg Hx      ROS:  Please see the history of present illness.   All other ROS reviewed and negative.     Physical Exam/Data: Vitals:   07/13/23 1300 07/13/23 1315 07/13/23 1330 07/13/23 1345  BP:  101/80    Pulse: (!) 52 91 (!) 40 (!) 57  Resp: (!) 27 (!) 23 19 (!) 25  Temp:      SpO2: 94% (!) 86% 99% 98%   No intake or output data in the 24 hours ending 07/13/23 1441    07/04/2023    5:52 AM 07/03/2023    6:30 AM 07/03/2023    4:30 AM  Last 3 Weights  Weight (lbs) 204 lb 9.4 oz 196 lb 14.4 oz 210 lb 1.6 oz  Weight (kg) 92.8 kg 89.313 kg 95.3 kg     There is no height or weight on file to calculate BMI.  General:  Ill appearing older male HEENT: normal Neck: + JVD Vascular: No carotid bruits; Distal pulses 2+ bilaterally Cardiac:  normal S1, S2; Irreg Irreg; soft systolic murmur  Lungs:  slightly diminished in bases Abd: soft, nontender, mild distention Ext: no edema Musculoskeletal:  No deformities, BUE and BLE strength normal and equal Skin: warm and dry  Neuro: no focal abnormalities noted Psych:  Normal affect   EKG:  The  EKG was personally reviewed and demonstrates:  Atrial  fibrillation RVR, 139bpm with PVCs Telemetry:  Telemetry was personally reviewed and demonstrates:  Atrial fibrillation rates 130-140s  Relevant CV Studies:  Cath 06/29/2023  Prox LAD lesion is 80% stenosed.   Mid LAD lesion is 90% stenosed.   Prox LAD to Mid LAD lesion is 95% stenosed.   Post intervention, there is a 0% residual stenosis.   Post intervention, there is a 0% residual stenosis.   Post intervention, there is a 0% residual stenosis.   Coronary angioplasty 06/29/2023 to LAD: Successful but complex and difficult procedure, PTCA-orbital atherectomy of the proximal, mid LAD followed by 2.75 x 48 mm Synergy XD DES deployed, 95% stenosis reduced to 0% with TIMI every 2-3 flow.      Recommendation: DAPT for 6 months with aspirin  and Plavix  and then Plavix  indefinitely.   Echo: 06/29/2023  IMPRESSIONS     1. Left ventricular ejection fraction, by estimation, is <20%. The left  ventricle has severely decreased function. The left ventricle demonstrates  global hypokinesis. Left ventricular diastolic function could not be  evaluated.   2. Large pericardial effusion. The pericardial effusion is  circumferential, anterior to the right ventricle and localized near the  right atrium. Findings are consistent with cardiac tamponade with decrease  in RV's ability to expand. Through exam,  pericardial effusion apperas to improve.   3. Right ventricular systolic function is normal. The right ventricular  size is normal.   4. The aortic valve was not well visualized. Aortic valve regurgitation  is not visualized.   5. The mitral valve was not well visualized. Mild mitral valve  regurgitation.   6. The inferior vena cava is dilated in size with <50% respiratory  variability, suggesting right atrial pressure of 15 mmHg.   7. This was a limited stat study after the assessment of tampondage  physiology through a pericardiocentesis.   FINDINGS   Left Ventricle: Left ventricular  ejection fraction, by estimation, is  <20%. The left ventricle has severely decreased function. The left  ventricle demonstrates global hypokinesis. The left ventricular internal  cavity size was normal in size. Suboptimal  image quality limits for assessment of left ventricular hypertrophy. Left  ventricular diastolic function could not be evaluated.   Right Ventricle: The right ventricular size is normal. No increase in  right ventricular wall thickness. Right ventricular systolic function is  normal.   Left Atrium: Left atrial size was normal in size.   Right Atrium: Right atrial size was normal in size.   Pericardium: A large pericardial effusion is present. The pericardial  effusion is circumferential, anterior to the right ventricle and localized  near the right atrium. The pericardial effusion appears to contain fibrous  material. There is diastolic  collapse of the right ventricular free wall. There is evidence of cardiac  tamponade.   Mitral Valve: The mitral valve was not well visualized. Mild mitral valve  regurgitation.   Tricuspid Valve: The tricuspid valve is not well visualized. Tricuspid  valve regurgitation is not demonstrated.   Aortic Valve: The aortic valve was not well visualized. Aortic valve  regurgitation is not visualized.   Pulmonic Valve: The pulmonic valve was not well visualized. Pulmonic valve  regurgitation is not visualized.   Aorta: The aortic root is normal in size and structure.   Venous: The inferior vena cava is dilated in size with less than 50%  respiratory variability, suggesting right atrial pressure of 15 mmHg.   IAS/Shunts:  The interatrial septum was not well visualized.   Limited echo: 07/01/2023  IMPRESSIONS     1. Left ventricular ejection fraction, by estimation, is 25 to 30%. The  left ventricle has severely decreased function. The left ventricle  demonstrates global hypokinesis.   2. Right ventricular systolic function  is mildly reduced.   3. Left atrial size was mildly dilated.   4. Trivial posterior effusion.   5. The mitral valve is normal in structure. Moderate mitral valve  regurgitation.   6. The aortic valve is tricuspid. There is mild calcification of the  aortic valve.   Conclusion(s)/Recommendation(s): Trivial posterior effusion.   Laboratory Data: High Sensitivity Troponin:   Recent Labs  Lab 06/29/23 1703  TROPONINIHS 663*     Chemistry Recent Labs  Lab 07/13/23 0958 07/13/23 1006 07/13/23 1109  NA 138 136  --   K 4.9 4.9  --   CL 101 102  --   CO2 21*  --   --   GLUCOSE 127* 125*  --   BUN 27* 30*  --   CREATININE 1.09 1.00  --   CALCIUM  9.2  --   --   MG  --   --  2.2  GFRNONAA >60  --   --   ANIONGAP 16*  --   --     Recent Labs  Lab 07/13/23 0958  PROT 6.6  ALBUMIN 3.6  AST 292*  ALT 199*  ALKPHOS 80  BILITOT 1.7*   Lipids No results for input(s): CHOL, TRIG, HDL, LABVLDL, LDLCALC, CHOLHDL in the last 168 hours.  Hematology Recent Labs  Lab 07/13/23 0958 07/13/23 1006  WBC 14.3*  --   RBC 3.91*  --   HGB 13.7 15.0  HCT 42.3 44.0  MCV 108.2*  --   MCH 35.0*  --   MCHC 32.4  --   RDW 14.6  --   PLT 295  --    Thyroid  No results for input(s): TSH, FREET4 in the last 168 hours.  BNP Recent Labs  Lab 07/13/23 1130  BNP 1,339.7*    DDimer No results for input(s): DDIMER in the last 168 hours.  Radiology/Studies:  CT ABDOMEN PELVIS WO CONTRAST Result Date: 07/13/2023 CLINICAL DATA:  Abdominal pain and vomiting.  Elevated LFTs. EXAM: CT ABDOMEN AND PELVIS WITHOUT CONTRAST TECHNIQUE: Multidetector CT imaging of the abdomen and pelvis was performed following the standard protocol without IV contrast. RADIATION DOSE REDUCTION: This exam was performed according to the departmental dose-optimization program which includes automated exposure control, adjustment of the mA and/or kV according to patient size and/or use of iterative  reconstruction technique. COMPARISON:  CT abdomen pelvis dated 06/29/2023. FINDINGS: Evaluation of this exam is limited in the absence of intravenous contrast. Lower chest: Small bilateral pleural effusions with partial compressive atelectasis of the lower lobes. There is 3 vessel coronary vascular calcification. No intra-abdominal free air or free fluid. Hepatobiliary: Fatty liver. No biliary dilatation. The gallbladder is unremarkable. Pancreas: Unremarkable. No pancreatic ductal dilatation or surrounding inflammatory changes. Spleen: Normal in size without focal abnormality. Adrenals/Urinary Tract: The adrenal glands are unremarkable the kidneys, visualized ureters, and urinary bladder appear unremarkable. Stomach/Bowel: There is sigmoid diverticulosis. There is no bowel obstruction or active inflammation. The appendix is normal. Vascular/Lymphatic: Advanced aortoiliac atherosclerotic disease. The IVC is unremarkable. No portal venous gas. There is no adenopathy. Reproductive: The prostate and seminal vesicles are grossly unremarkable. Other: None Musculoskeletal: Osteopenia with degenerative changes. Similar appearance of L3 compression fracture.  No acute osseous pathology. IMPRESSION: 1. No acute intra-abdominal or pelvic pathology. 2. Sigmoid diverticulosis. No bowel obstruction. Normal appendix. 3. Fatty liver. 4. Small bilateral pleural effusions with partial compressive atelectasis of the lower lobes. 5.  Aortic Atherosclerosis (ICD10-I70.0). Electronically Signed   By: Vanetta Chou M.D.   On: 07/13/2023 12:34   DG Chest 1 View Result Date: 07/13/2023 CLINICAL DATA:  Atrial fibrillation EXAM: CHEST  1 VIEW COMPARISON:  Chest x-ray performed July 01, 2023 FINDINGS: Heart is enlarged. Interstitial prominence. A small right pleural effusion with associated consolidation is present. Minimal blunting of the left costophrenic angle. No pneumothorax. IMPRESSION: 1. Small right pleural effusion with  associated consolidation. 2. Trace left pleural effusion. 3. Interstitial prominence, possibly sequelae of pulmonary vascular congestion. Electronically Signed   By: Maude Naegeli M.D.   On: 07/13/2023 10:23     Assessment and Plan:  Natale Washington Whedbee. is a 78 y.o. male with a hx of chronic systolic heart failure (LVEF 20 to 25%), CAD with recent PCI of proximal LAD complicated by perforation and pericardial effusion, severe MR, idiopathic pulmonary fibrosis who is being seen 07/13/2023 for the evaluation of atrial fibrillation with RVR at the request of Dr. Lenor.  New onset Atrial fibrillation with RVR -- in the setting of days of n/v/d, reports some palpitations PTA but overall complains mostly of shortness of breath -- rates in the 130-140s, has been started on IV amiodarone  w/150mg  bolus. -- with low EF unable to use Cardizem, avoid metoprolol   -- will add IV heparin  for now with plans for Eliquis once GI work up complete and able to tolerate POs  Chronic HFrEF ICM -- known LVEF of 20-25%, does not appear volume overloaded on exam -- CXR with small right sided pleural effusion, BNP 1339 (up from 575) -- home jardiance  to held for now -- LFTs are elevated, though warm/dry on exam. Will check a lactic acid  N/V/D, GI illness Elevated LFTs -- symptoms for the past 4-5 days, denies any sick contacts -- AST 292, ALT 199, TBili 1.7, unclear etiology at this time. As above, check lactic -- work up per primary  CAD -- recent cardiac cath 06/29/2023 with know CTO of RCA, PCI to LAD -- denies any chest pain PTA -- continue ASA, plavix  for now (will consider dropping ASA if needed with stable CAD and need for [facility])  Recent pericardial effusion after PCI -- required pericardiocentesis with serial echo showing trivial effusion -- will check limited echo  Severe MR -- following with structural heart team  Addendum: lactic acid 2.8, AHF to see with concern for low output HF. Spoke with Dr.  Rolan, orders for IV lasix  80mg  BID, PICC and coox placed.    Risk Assessment/Risk Scores:  CHA2DS2-VASc Score = 5  This indicates a 7.2% annual risk of stroke. The patient's score is based upon: CHF History: 1 HTN History: 1 Diabetes History: 0 Stroke History: 0 Vascular Disease History: 1 Age Score: 2 Gender Score: 0   For questions or updates, please contact [facility] Please consult [email or web address] for contact info under    Signed, Manuelita Rummer, NP  07/13/2023 2:41 PM  Patient seen with NP, I formulated the plan and agree with the above note.   Patient with history as noted above.  Was recently discharged after admission with PCI to LAD complicated by coronary perforation and tamponade with pericardiocentesis.  Per his wife, he initially felt ok after discharge but steadily developed worsening  dyspnea as well as nausea/occasional vomiting.  Occasional loose stool but not having prominent diarrhea.  Due to nausea, he has not taken any meds x 4 days.  He thinks that his medications caused the nausea.  He was seen in CHF clinic today, noted to be in AF with RVR in 140s.  He did not feel palpitations.  He was sent to the ER.  BNP 1340, lactate 2.8.  LFTs mildly elevated.  Creatinine stable at 1.09. CT abdomen/pelvis was done, no acute findings.    General: NAD Neck: JVP 16 cm, no thyromegaly or thyroid  nodule.  Lungs: Clear to auscultation bilaterally with normal respiratory effort. CV: Nondisplaced PMI.  Heart tachy, irregular S1/S2, no S3/S4, 1/6 HSM apex.  No peripheral edema.  No carotid bruit.  Difficult to palpate pedal pulses.  Abdomen: Soft, nontender, no hepatosplenomegaly, no distention.  Skin: Intact without lesions or rashes.  Neurologic: Alert and oriented x 3.  Psych: Normal affect. Extremities: No clubbing or cyanosis. Cool feet.  HEENT: Normal.   1. Acute on chronic systolic CHF: Ischemic cardiomyopathy.  Last echo in 6/25 with EF 25-30%, mild RV  dysfunction, moderate MR.  He is volume overloaded on exam, suspect volume overload/CHF is the cause of nausea, mildly elevated LFTs, and increased dyspnea.  This may have been triggered by the onset of atrial fibrillation (he does not feel palpitations so unsure when it began). I am concerned for low output HF given mildly elevated lactate at 2.8 and cool extremities.  Creatinine stable.  - Will get rate controlled with amiodarone  gtt. Ultimately will need to get out of atrial fibrillation.  - Start Lasix  80 mg IV bid.  - Hold bisoprolol  with concern for low output.  - Place PICC to follow CVP and co-ox.  If co-ox is low, will start milrinone  gtt once HR controlled on amiodarone  gtt.  - Repeat echo to rule out recurrent pericardial effusion and also to assess MR.  2. Atrial fibrillation: New diagnosis.  Noted to have AF with RVR when he arrived in CHF clinic today.  This may have triggered CHF exacerbation.  - Control rate with amiodarone  gtt.  - For now, start heparin  gtt.  Eventually will need Eliquis.  - Once volume better-controlled, proceed with TEE-guided DCCV.  - Eventual EP evaluation for AF ablation.  3. CAD: Has CTO of RCA.  In 6/25, had complicated PCI to severe proximal LAD stenosis with orbital atherectomy and DES.  This was complicated by coronary perforation and tamponade. No chest pain.  - He is on Plavix  and ASA though he has not taken for several days due to nausea.  Will plan to continue Plavix .  - Will stop ASA after 1 month post-PCI given need for anticoagulation.  - Continue statin.  4. H/o pericardial tamponade: Due to coronary perforation in 6/25 associated with LAD PCI. S/p pericardiocentesis.  - I will arrange for echo to ensure no recurrent pericardial effusion.  5. Mitral regurgitation: Moderate-severe by TEE in 5/25 and being considered for mTEER, but noted as no more than moderate on recent echoes.   - Echo this admission to reassess mitral regurgitation.  6.  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  Follow with treatment.  7. 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  - Stop mexiletine now that he is on amiodarone .  8. Idiopathic pulmonary fibrosis: Continue pirfenidone .   Ezra Shuck 07/13/2023 4:29 PM

## 2023-07-13 NOTE — H&P (Signed)
 History and Physical   Jose Hines. FMW:[id number] DOB: 02/19/1945 DOA: 07/13/2023  PCP: Seabron Lenis, MD   Patient coming from: Home/advanced heart failure clinic  Chief Complaint: Nausea, vomiting, A-fib with RVR  HPI: Jose O Markavious Micco. is a 78 y.o. male with medical history significant of hypertension, GERD, CAD status post DES 06/30/2023, chronic combined systolic and diastolic CHF, OSA, IPF, COPD presenting with new onset A-fib with RVR.  Patient presenting from advanced heart failure clinic follow-up visit today.  Patient has had several days of nausea and vomiting and unable to tolerate p.o. including his home medications.  At his follow-up visit today he was noted to be in A-fib with RVR and was sent to the ED for further evaluation of this as well as likely concomitant CHF exacerbation.  Recently underwent stent placement for his CAD on 06/30/2023.  Is on DAPT but unable to take for the last 2 days in the setting of above.  That admission was complicated by a hemorrhagic pericardial effusion for which he underwent a pericardiocentesis and is being monitored with limited echocardiograms.  Denies fevers, chills, chest pain, abdominal pain  ED Course: Vital signs in the ED notable for heart rate in 80s-140s, blood pressure in the 90s-100 systolic.  Lab workup included CMP with bicarb 21, gap 16, BUN 27, glucose 127, AST 292, ALT 199, T. bili 1.7.  CBC with leukocytosis to 14.3.  BNP 1339.  Lipase normal.  Magnesium  normal.  Chest x-ray shows small right pleural effusion and trace left pleural effusion.  Also noted was interstitial prominence consistent with vascular congestion.  CT of the abdomen/pelvis showed no acute abnormality but did show small bilateral effusions in the lower lobes.  Patient received amiodarone  infusion, Zofran , 250 cc IV fluid, there is IV Lasix  in the ED.  Cardiology consulted in the ED and anticipate advanced heart failure involvement.  Review of Systems:  As per HPI otherwise all other systems reviewed and are negative.  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   Alcoholic polyneuropathy (HCC)    Allergy    Asthma    BPH (benign prostatic hyperplasia)    Cardiogenic shock (HCC) 06/29/2023   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 failure) (HCC) 04/23/2023   COPD (chronic obstructive pulmonary disease) (HCC)    Depression    Depression    Diverticulosis    Dyspnea on exertion    Elevated LDL cholesterol level    GERD with stricture    Gout    Hiatal hernia    Hypertension    Hypotension 06/29/2023   Hypotension after procedure 06/29/2023   IPF (idiopathic pulmonary fibrosis) (HCC) 04/23/2023   Kidney stones    2 times - passed stones, no surgery required   Lumbar and sacral osteoarthritis    Mitral regurgitation 04/23/2023   OSA on CPAP 04/23/2023   Peripheral polyneuropathy    Shock (HCC) 07/01/2023   Sleep apnea    wears CPAP   Wears hearing aid    Bil    Past Surgical History:  Procedure Laterality Date   ARTERIAL LINE INSERTION Left 06/29/2023   Procedure: ARTERIAL LINE INSERTION;  Surgeon: Wonda Sharper, MD;  Location: [facility] INVASIVE CV LAB;  Service: Cardiovascular;  Laterality: Left;   CENTRAL LINE INSERTION Right 06/29/2023   Procedure: CENTRAL LINE INSERTION;  Surgeon: Wonda Sharper, MD;  Location: [facility] INVASIVE CV LAB;  Service: Cardiovascular;  Laterality: Right;   COLONOSCOPY  CORONARY ATHERECTOMY N/A 06/29/2023   Procedure: CORONARY ATHERECTOMY;  Surgeon: Ladona Heinz, MD;  Location: [facility] INVASIVE CV LAB;  Service: Cardiovascular;  Laterality: N/A;   CORONARY STENT INTERVENTION N/A 06/29/2023   Procedure: CORONARY STENT INTERVENTION;  Surgeon: Ladona Heinz, MD;  Location: [facility] INVASIVE CV LAB;  Service: Cardiovascular;  Laterality: N/A;   INGUINAL HERNIA REPAIR     PERICARDIOCENTESIS N/A 06/29/2023   Procedure: PERICARDIOCENTESIS;  Surgeon: Wonda Sharper, MD;   Location: [facility] INVASIVE CV LAB;  Service: Cardiovascular;  Laterality: N/A;   POLYPECTOMY     RIGHT/LEFT HEART CATH AND CORONARY ANGIOGRAPHY N/A 11/19/2022   Procedure: RIGHT/LEFT HEART CATH AND CORONARY ANGIOGRAPHY;  Surgeon: Verlin Lonni BIRCH, MD;  Location: [facility] INVASIVE CV LAB;  Service: Cardiovascular;  Laterality: N/A;   TONSILLECTOMY AND ADENOIDECTOMY     TRANSESOPHAGEAL ECHOCARDIOGRAM (CATH LAB) N/A 05/18/2023   Procedure: TRANSESOPHAGEAL ECHOCARDIOGRAM;  Surgeon: Francyne Headland, MD;  Location: [facility] INVASIVE CV LAB;  Service: Cardiovascular;  Laterality: N/A;    Social History  reports that he quit smoking about 54 years ago. His smoking use included cigarettes. He started smoking about 64 years ago. He has a 20 pack-year smoking history. He has never used smokeless tobacco. He reports current alcohol use of about 7.0 - 14.0 standard drinks of alcohol per week. He reports that he does not use drugs.  No Known Allergies  Family History  Problem Relation Age of Onset   Transient ischemic attack Mother    Heart disease Father 42   Other Son        car wreck   Colon cancer Neg Hx    Esophageal cancer Neg Hx    Rectal cancer Neg Hx    Stomach cancer Neg Hx   Reviewed on admission  Prior to Admission medications   Medication Sig Start Date End Date Taking? Authorizing Provider  albuterol  (PROVENTIL  HFA;VENTOLIN  HFA) 108 (90 Base) MCG/ACT inhaler Inhale 1-2 puffs into the lungs every 6 (six) hours as needed for wheezing or shortness of breath. 12/24/16   Midge Golas, MD  albuterol  (PROVENTIL ) (2.5 MG/3ML) 0.083% nebulizer solution Take 3 mLs (2.5 mg total) by nebulization every 6 (six) hours as needed for wheezing or shortness of breath. 08/02/18   Olalere, Adewale A, MD  allopurinol  (ZYLOPRIM ) 300 MG tablet Take 300 mg by mouth daily. Patient not taking: Reported on 07/13/2023    [provider]  aspirin  81 MG tablet Take 81 mg by mouth daily. Patient not taking:  Reported on 07/13/2023    [provider]  bisoprolol  2.5 MG TABS Take 2.5 mg by mouth daily. Patient not taking: Reported on 07/13/2023 07/04/23   Strader, Brittany M, PA-C  budesonide  (PULMICORT ) 0.5 MG/2ML nebulizer solution INHALE 2 MILLILITERS VIA NEBULIZATION BY MOUTH 2 TIMES A DAY Patient not taking: Reported on 07/13/2023 06/22/23   Neda Jennet LABOR, MD  buPROPion  (WELLBUTRIN  XL) 150 MG 24 hr tablet Take 150 mg by mouth every morning. Patient not taking: Reported on 07/13/2023    [provider]  clopidogrel  (PLAVIX ) 75 MG tablet Take 1 tablet (75 mg total) by mouth daily. Patient not taking: Reported on 07/13/2023 07/04/23   Strader, Brittany M, PA-C  empagliflozin  (JARDIANCE ) 10 MG TABS tablet Take 1 tablet (10 mg total) by mouth daily. Patient not taking: Reported on 07/13/2023 07/04/23   Johnson Laymon HERO, PA-C  ezetimibe  (ZETIA ) 10 MG tablet Take 10 mg by mouth daily. Patient not taking: Reported on 07/13/2023    [provider]  mexiletine (MEXITIL ) 150 MG capsule Take 1 capsule (150 mg total) by mouth every 8 (eight) hours. Patient not taking: Reported on 07/13/2023 07/04/23   Johnson Laymon HERO, PA-C  nitroGLYCERIN  (NITROSTAT ) 0.4 MG SL tablet Place 1 tablet (0.4 mg total) under the tongue every 5 (five) minutes x 3 doses as needed for chest pain. 07/04/23   Johnson, Laymon HERO, PA-C  pantoprazole  (PROTONIX ) 40 MG tablet Take 1 tablet (40 mg total) by mouth daily before breakfast. Patient not taking: Reported on 07/13/2023 06/23/23   Ladona Heinz, MD  Pirfenidone  267 MG TABS Take 3 tablets (801 mg total) by mouth 3 (three) times daily with meals. Patient not taking: Reported on 07/13/2023 02/25/23   Geronimo Amel, MD  polyethylene glycol (MIRALAX  / GLYCOLAX ) 17 g packet Take 17 g by mouth daily as needed for mild constipation. Patient not taking: Reported on 07/13/2023 04/25/23   Rosario Eland I, MD  rosuvastatin  (CRESTOR ) 20 MG tablet Take 20 mg by mouth daily. Patient  not taking: Reported on 07/13/2023 03/23/23   Ladona Heinz, MD  spironolactone  (ALDACTONE ) 25 MG tablet Take 0.5 tablets (12.5 mg total) by mouth daily. Patient not taking: Reported on 07/13/2023 07/04/23   Johnson Laymon HERO, PA-C    Physical Exam: Vitals:   07/13/23 1300 07/13/23 1315 07/13/23 1330 07/13/23 1345  BP:  101/80    Pulse: (!) 52 91 (!) 40 (!) 57  Resp: (!) 27 (!) 23 19 (!) 25  Temp:      SpO2: 94% (!) 86% 99% 98%    Physical Exam Constitutional:      General: He is not in acute distress.    Appearance: Normal appearance.  HENT:     Head: Normocephalic and atraumatic.     Mouth/Throat:     Mouth: Mucous membranes are moist.     Pharynx: Oropharynx is clear.  Eyes:     Extraocular Movements: Extraocular movements intact.     Pupils: Pupils are equal, round, and reactive to light.  Cardiovascular:     Rate and Rhythm: Tachycardia present. Rhythm irregular.     Pulses: Normal pulses.     Heart sounds: Normal heart sounds.  Pulmonary:     Effort: Pulmonary effort is normal. No respiratory distress.     Breath sounds: Normal breath sounds.  Abdominal:     General: Bowel sounds are normal. There is no distension.     Palpations: Abdomen is soft.     Tenderness: There is no abdominal tenderness.  Musculoskeletal:        General: No swelling or deformity.  Skin:    General: Skin is warm and dry.  Neurological:     General: No focal deficit present.     Mental Status: Mental status is at baseline.    Labs on Admission: I have personally reviewed following labs and imaging studies  CBC: Recent Labs  Lab 07/13/23 0958 07/13/23 1006  WBC 14.3*  --   HGB 13.7 15.0  HCT 42.3 44.0  MCV 108.2*  --   PLT 295  --     Basic Metabolic Panel: Recent Labs  Lab 07/13/23 0958 07/13/23 1006 07/13/23 1109  NA 138 136  --   K 4.9 4.9  --   CL 101 102  --   CO2 21*  --   --   GLUCOSE 127* 125*  --   BUN 27* 30*  --   CREATININE 1.09 1.00  --   CALCIUM  9.2  --   --  MG  --   --  2.2    GFR: Estimated Creatinine Clearance: 70.9 mL/min (by C-G formula based on SCr of 1 mg/dL).  Liver Function Tests: Recent Labs  Lab 07/13/23 0958  AST 292*  ALT 199*  ALKPHOS 80  BILITOT 1.7*  PROT 6.6  ALBUMIN 3.6    Urine analysis: No results found for: COLORURINE, APPEARANCEUR, LABSPEC, PHURINE, GLUCOSEU, HGBUR, BILIRUBINUR, KETONESUR, PROTEINUR, UROBILINOGEN, NITRITE, LEUKOCYTESUR  Radiological Exams on Admission: CT ABDOMEN PELVIS WO CONTRAST Result Date: 07/13/2023 CLINICAL DATA:  Abdominal pain and vomiting.  Elevated LFTs. EXAM: CT ABDOMEN AND PELVIS WITHOUT CONTRAST TECHNIQUE: Multidetector CT imaging of the abdomen and pelvis was performed following the standard protocol without IV contrast. RADIATION DOSE REDUCTION: This exam was performed according to the departmental dose-optimization program which includes automated exposure control, adjustment of the mA and/or kV according to patient size and/or use of iterative reconstruction technique. COMPARISON:  CT abdomen pelvis dated 06/29/2023. FINDINGS: Evaluation of this exam is limited in the absence of intravenous contrast. Lower chest: Small bilateral pleural effusions with partial compressive atelectasis of the lower lobes. There is 3 vessel coronary vascular calcification. No intra-abdominal free air or free fluid. Hepatobiliary: Fatty liver. No biliary dilatation. The gallbladder is unremarkable. Pancreas: Unremarkable. No pancreatic ductal dilatation or surrounding inflammatory changes. Spleen: Normal in size without focal abnormality. Adrenals/Urinary Tract: The adrenal glands are unremarkable the kidneys, visualized ureters, and urinary bladder appear unremarkable. Stomach/Bowel: There is sigmoid diverticulosis. There is no bowel obstruction or active inflammation. The appendix is normal. Vascular/Lymphatic: Advanced aortoiliac atherosclerotic disease. The IVC is unremarkable. No  portal venous gas. There is no adenopathy. Reproductive: The prostate and seminal vesicles are grossly unremarkable. Other: None Musculoskeletal: Osteopenia with degenerative changes. Similar appearance of L3 compression fracture. No acute osseous pathology. IMPRESSION: 1. No acute intra-abdominal or pelvic pathology. 2. Sigmoid diverticulosis. No bowel obstruction. Normal appendix. 3. Fatty liver. 4. Small bilateral pleural effusions with partial compressive atelectasis of the lower lobes. 5.  Aortic Atherosclerosis (ICD10-I70.0). Electronically Signed   By: Vanetta Chou M.D.   On: 07/13/2023 12:34   DG Chest 1 View Result Date: 07/13/2023 CLINICAL DATA:  Atrial fibrillation EXAM: CHEST  1 VIEW COMPARISON:  Chest x-ray performed July 01, 2023 FINDINGS: Heart is enlarged. Interstitial prominence. A small right pleural effusion with associated consolidation is present. Minimal blunting of the left costophrenic angle. No pneumothorax. IMPRESSION: 1. Small right pleural effusion with associated consolidation. 2. Trace left pleural effusion. 3. Interstitial prominence, possibly sequelae of pulmonary vascular congestion. Electronically Signed   By: Maude Naegeli M.D.   On: 07/13/2023 10:23    EKG: Independently reviewed.  Atrial fibrillation with RVR at 139 beats minute.  Significant baseline wander making interpretation difficult.  Nonspecific T wave changes.  QTc okay.  Assessment/Plan Active Problems:   GERD   Essential hypertension   COPD with acute exacerbation (HCC)   OSA on CPAP   IPF (idiopathic pulmonary fibrosis) (HCC)   CAD (coronary artery disease)   Acute on chronic combined systolic and diastolic CHF (congestive heart failure) (HCC)   New onset A-fib with RVR > Patient noted to be in new onset A-fib with RVR.  Heart failure follow-up visit today. > In the setting of low output heart failure and recent GI illness leading to decreased p.o. intake and inability to take medications for the  last couple days. > Started on amiodarone  in the ED based on how patient heart failure clinic recommendation > Cardiology consulted  inpatient - Appreciate cardiology recommendations and assistance - Monitor on progressive unit - Continue with amiodarone  infusion - Holding anticoagulation, defer choice to cardiology - Repeat echocardiogram  Acute on chronic combined systolic and diastolic CHF > In the setting of new onset atrial fibrillation with RVR as above. > Developed some hypoxia in the ED and is requiring 2 L of oxygen.  BNP elevated to 1339.  Mild interstitial prominence on chest x-ray with small bilateral effusions > With low output heart failure his anion gap acidosis may be secondary to elevated lactic acid.  Also LFTs may be congestive hepatopathy. - Appreciate cardiology recommendations and assistance - Received initial dose of Lasix  in the ED.,  Further dosing as per cardiology recommendations - Treat atrial fibrillation with RVR as above - Trend renal function electrolytes - Strict I's and O's, daily weights - Echocardiogram - Follow-up lactic acid - Holding bisoprolol , spironolactone , Jardiance   GI illness > 3 days of nausea vomiting.  Unable to tolerate p.o.  Noted leukocytosis in the ED at 14.3 though this may be reactive.  CT of the abdomen/pelvis showed no acute abnormality. > Possible viral etiology.  Hepatitis panel ordered in the ED given elevated LFTs, but these may be due to congestive hepatopathy. - Supportive care for now with diet as tolerated - As needed Zofran , patient especially needs to take his Plavix  and on an anticoagulant he is started on  CAD > Recent DES on 06/30/2023.  On DAPT. > ASA to be held in favor of anticoagulation with new onset A-fib in addition to Plavix . - Continue home Plavix  - Hold ASA - Start anticoagulation as above - Holding bisoprolol  - Continue home Zetia , rosuvastatin   IPF COPD - Resume home pirfenidone , Pulmicort ,  albuterol   OSA - Continue home CPAP  Hypertension > BP low/low normal in the ED. - Holding antihypertensives other than diuresis as needed.  History of PVCs > Previously on mexiletine - Holding mexiletine, on amiodarone  for A-fib with RVR  GERD - Continue home PPI  DVT prophylaxis: Await cardiology input.  Likely Eliquis if tolerating p.o., if not may need dose Lovenox  versus heparin . Code Status:   Full Family Communication:  Updated at bedside  Disposition Plan:   Patient is from:  Home  Anticipated DC to:  Home  Anticipated DC date:  2 to 7 days  Anticipated DC barriers: None  Consults called:  Cardiology Admission status:  Inpatient, progressive  Severity of Illness: The appropriate patient status for this patient is INPATIENT. Inpatient status is judged to be reasonable and necessary in order to provide the required intensity of service to ensure the patient's safety. The patient's presenting symptoms, physical exam findings, and initial radiographic and laboratory data in the context of their chronic comorbidities is felt to place them at high risk for further clinical deterioration. Furthermore, it is not anticipated that the patient will be medically stable for discharge from the hospital within 2 midnights of admission.   * I certify that at the point of admission it is my clinical judgment that the patient will require inpatient hospital care spanning beyond 2 midnights from the point of admission due to high intensity of service, high risk for further deterioration and high frequency of surveillance required.Jose Marsa KATHEE Seena MD [facility]  How to contact the [facility] Attending or Consulting provider 7A - 7P or covering provider during after hours 7P -7A, for this patient?   Check the care team in [facility] and look for a)  attending/consulting [facility] provider listed and b) the [facility] team listed Log into [email or web address] and use [facility] universal password to access.  If you do not have the password, please contact the hospital operator. Locate the [facility] provider you are looking for under [facility] and page to a number that you can be directly reached. If you still have difficulty reaching the provider, please page the [facility] (Director on Call) for the Hospitalists listed on amion for assistance.  07/13/2023, 2:25 PM

## 2023-07-13 NOTE — Progress Notes (Signed)
 PHARMACY - ANTICOAGULATION CONSULT NOTE  Pharmacy Consult for heparin  initiation Indication: atrial fibrillation  No Known Allergies  Patient Measurements: Height: 5' 11 (180.3 cm) Weight: 92.8 kg (204 lb 9.4 oz) IBW/kg (Calculated) : 75.3 HEPARIN  DW (KG): 92.8  Vital Signs: Temp: 98.4 F (36.9 C) (07/08 1449) Temp Source: Oral (07/08 1449) BP: 101/80 (07/08 1315) Pulse Rate: 57 (07/08 1345)  Labs: Recent Labs    07/13/23 0958 07/13/23 1006  HGB 13.7 15.0  HCT 42.3 44.0  PLT 295  --   CREATININE 1.09 1.00    Estimated Creatinine Clearance: 70.9 mL/min (by C-G formula based on SCr of 1 mg/dL).   Medical History: 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   Alcoholic polyneuropathy (HCC)    Allergy    Asthma    BPH (benign prostatic hyperplasia)    Cardiogenic shock (HCC) 06/29/2023   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 failure) (HCC) 04/23/2023   COPD (chronic obstructive pulmonary disease) (HCC)    Depression    Depression    Diverticulosis    Dyspnea on exertion    Elevated LDL cholesterol level    GERD with stricture    Gout    Hiatal hernia    Hypertension    Hypotension 06/29/2023   Hypotension after procedure 06/29/2023   IPF (idiopathic pulmonary fibrosis) (HCC) 04/23/2023   Kidney stones    2 times - passed stones, no surgery required   Lumbar and sacral osteoarthritis    Mitral regurgitation 04/23/2023   OSA on CPAP 04/23/2023   Peripheral polyneuropathy    Shock (HCC) 07/01/2023   Sleep apnea    wears CPAP   Wears hearing aid    Bil    Medications:  Clopidogral 75mg  once daily ASA 81mg  once daily  Assessment: 20 YOM presents to ED with new-onset a-fib. Pt does not have history of anticoagulation, and no history of bleeding, per chart review. HGB and PLT WNL.  Goal of Therapy:  Heparin  level 0.3-0.7 units/ml Monitor platelets by anticoagulation protocol: Yes   Plan:  Give  4500 units bolus x 1 Start heparin  infusion at 1300 units/hr Check anti-Xa level in 8 hours hours and daily while on heparin  Continue to monitor H&H and platelets   Belvie Macintosh, PharmD Candidate 07/13/2023,3:11 PM

## 2023-07-13 NOTE — ED Provider Triage Note (Signed)
 Emergency Medicine Provider Triage Evaluation Note  Jarrette MALVA Con Raddle. , a 78 y.o. male  was evaluated in triage.  Pt complains of weakness and A-fib.  Review of Systems  Positive: Nausea Negative: Abdominal pain fever, chest pain, dyspnea  Physical Exam  BP 125/86 (BP Location: Right Arm)   Pulse (!) 140   Temp 97.8 F (36.6 C)   Resp 18   SpO2 99%  Gen:   Awake, no distress   Resp:  Normal effort  MSK:   Moves extremities without difficulty  Other:  Heart is tachycardic and irregularly irregular  Medical Decision Making  Medically screening exam initiated at 9:49 AM.  Appropriate orders placed.  Kelton MALVA Con Raddle. was informed that the remainder of the evaluation will be completed by another provider, this initial triage assessment does not replace that evaluation, and the importance of remaining in the ED until their evaluation is complete.  Patient with nausea and new onset A-fib Will place orders Cardiology CRIS Levander Houston, MD 07/13/23 [phone]

## 2023-07-13 NOTE — Progress Notes (Signed)
   07/13/23 1946  BiPAP/CPAP/SIPAP  $ Non-Invasive Home Ventilator  Initial  BiPAP/CPAP/SIPAP Pt Type Adult (Home unit)  Mask Type Nasal pillows  Respiratory Rate 18 breaths/min  FiO2 (%) 21 %  Patient Home Machine Yes  Safety Check Completed by RT for Home Unit Yes, no issues noted  Patient Home Mask Yes  Patient Home Tubing Yes  Device Plugged into RED Power Outlet Yes  BiPAP/CPAP /SiPAP Vitals  Pulse Rate (!) 30  Resp (!) 23  BP (!) 88/64  SpO2 93 %  MEWS Score/Color  MEWS Score 4  MEWS Score Color Red

## 2023-07-14 ENCOUNTER — Ambulatory Visit: Admitting: Cardiology

## 2023-07-14 ENCOUNTER — Inpatient Hospital Stay (HOSPITAL_COMMUNITY)

## 2023-07-14 ENCOUNTER — Other Ambulatory Visit (HOSPITAL_COMMUNITY)

## 2023-07-14 DIAGNOSIS — R579 Shock, unspecified: Secondary | ICD-10-CM

## 2023-07-14 DIAGNOSIS — I509 Heart failure, unspecified: Secondary | ICD-10-CM | POA: Diagnosis not present

## 2023-07-14 DIAGNOSIS — I5023 Acute on chronic systolic (congestive) heart failure: Secondary | ICD-10-CM | POA: Diagnosis not present

## 2023-07-14 DIAGNOSIS — I340 Nonrheumatic mitral (valve) insufficiency: Secondary | ICD-10-CM

## 2023-07-14 DIAGNOSIS — I4891 Unspecified atrial fibrillation: Secondary | ICD-10-CM | POA: Diagnosis not present

## 2023-07-14 LAB — COMPREHENSIVE METABOLIC PANEL WITH GFR
ALT: 1383 U/L — ABNORMAL HIGH (ref 0–44)
AST: 2466 U/L — ABNORMAL HIGH (ref 15–41)
Albumin: 3.2 g/dL — ABNORMAL LOW (ref 3.5–5.0)
Alkaline Phosphatase: 81 U/L (ref 38–126)
Anion gap: 12 (ref 5–15)
BUN: 39 mg/dL — ABNORMAL HIGH (ref 8–23)
CO2: 21 mmol/L — ABNORMAL LOW (ref 22–32)
Calcium: 8.3 mg/dL — ABNORMAL LOW (ref 8.9–10.3)
Chloride: 100 mmol/L (ref 98–111)
Creatinine, Ser: 1.5 mg/dL — ABNORMAL HIGH (ref 0.61–1.24)
GFR, Estimated: 47 mL/min — ABNORMAL LOW (ref >60)
Glucose, Bld: 134 mg/dL — ABNORMAL HIGH (ref 70–99)
Potassium: 4.7 mmol/L (ref 3.5–5.1)
Sodium: 133 mmol/L — ABNORMAL LOW (ref 135–145)
Total Bilirubin: 1.5 mg/dL — ABNORMAL HIGH (ref 0.0–1.2)
Total Protein: 6.1 g/dL — ABNORMAL LOW (ref 6.5–8.1)

## 2023-07-14 LAB — COOXEMETRY PANEL
Carboxyhemoglobin: 1.2 % (ref 0.5–1.5)
Carboxyhemoglobin: 1.2 % (ref 0.5–1.5)
Carboxyhemoglobin: 2.8 % — ABNORMAL HIGH (ref 0.5–1.5)
Methemoglobin: <0.7 % (ref 0.0–1.5)
Methemoglobin: <0.7 % (ref 0.0–1.5)
Methemoglobin: <0.7 % (ref 0.0–1.5)
O2 Saturation: 39.3 %
O2 Saturation: 54.5 %
O2 Saturation: 57.5 %
Total hemoglobin: 13.6 g/dL (ref 12.0–16.0)
Total hemoglobin: 12.9 g/dL (ref 12.0–16.0)
Total hemoglobin: 12.4 g/dL (ref 12.0–16.0)

## 2023-07-14 LAB — ECHOCARDIOGRAM LIMITED
Est EF: <20
Height: 71 in
S' Lateral: 6.2 cm
Weight: 3273.39 [oz_av]

## 2023-07-14 LAB — CBC
HCT: 39.7 % (ref 39.0–52.0)
Hemoglobin: 13.3 g/dL (ref 13.0–17.0)
MCH: 36.3 pg — ABNORMAL HIGH (ref 26.0–34.0)
MCHC: 33.5 g/dL (ref 30.0–36.0)
MCV: 108.5 fL — ABNORMAL HIGH (ref 80.0–100.0)
Platelets: 256 K/uL (ref 150–400)
RBC: 3.66 MIL/uL — ABNORMAL LOW (ref 4.22–5.81)
RDW: 14.6 % (ref 11.5–15.5)
WBC: 18.1 K/uL — ABNORMAL HIGH (ref 4.0–10.5)
nRBC: 1 % — ABNORMAL HIGH (ref 0.0–0.2)

## 2023-07-14 LAB — CG4 I-STAT (LACTIC ACID): Lactic Acid, Venous: 3.1 mmol/L (ref 0.5–1.9)

## 2023-07-14 LAB — PROCALCITONIN: Procalcitonin: <0.1 ng/mL

## 2023-07-14 LAB — HEPARIN LEVEL (UNFRACTIONATED)
Heparin Unfractionated: 0.17 [IU]/mL — ABNORMAL LOW (ref 0.30–0.70)
Heparin Unfractionated: >1.1 [IU]/mL — ABNORMAL HIGH (ref 0.30–0.70)
Heparin Unfractionated: 0.35 [IU]/mL (ref 0.30–0.70)

## 2023-07-14 LAB — MAGNESIUM: Magnesium: 2 mg/dL (ref 1.7–2.4)

## 2023-07-14 LAB — MRSA NEXT GEN BY PCR, NASAL: MRSA by PCR Next Gen: NOT DETECTED

## 2023-07-14 LAB — LACTIC ACID, PLASMA: Lactic Acid, Venous: 2 mmol/L (ref 0.5–1.9)

## 2023-07-14 MED ORDER — LIDOCAINE-EPINEPHRINE (PF) 2 %-1:200000 IJ SOLN
INTRAMUSCULAR | Status: DC
Start: 2023-07-14 — End: 2023-07-14
  Filled 2023-07-14: qty 20

## 2023-07-14 MED ORDER — MIDAZOLAM HCL 2 MG/2ML IJ SOLN
1.0000 mg | Freq: Once | INTRAMUSCULAR | Status: AC
  Administered 2023-07-14: 1 mg via INTRAVENOUS
  Filled 2023-07-14: qty 2

## 2023-07-14 MED ORDER — SODIUM CHLORIDE 0.9 % IV SOLN
2.0000 g | INTRAVENOUS | Status: DC
  Administered 2023-07-14 – 2023-07-15 (×2): 2 g via INTRAVENOUS
  Filled 2023-07-14 (×2): qty 20

## 2023-07-14 MED ORDER — COLCHICINE 0.6 MG PO TABS: 0.6000 mg | ORAL_TABLET | Freq: Two times a day (BID) | ORAL | Status: DC

## 2023-07-14 MED ORDER — AMIODARONE LOAD VIA INFUSION
150.0000 mg | Freq: Once | INTRAVENOUS | Status: AC
  Administered 2023-07-14: 150 mg via INTRAVENOUS
  Filled 2023-07-14: qty 83.34

## 2023-07-14 MED ORDER — MEXILETINE HCL 150 MG PO CAPS
150.0000 mg | ORAL_CAPSULE | Freq: Two times a day (BID) | ORAL | Status: DC
  Administered 2023-07-14 (×2): 150 mg via ORAL
  Filled 2023-07-14 (×2): qty 1

## 2023-07-14 MED ORDER — COLCHICINE 0.6 MG PO TABS: 1.2000 mg | ORAL_TABLET | Freq: Once | ORAL | Status: DC

## 2023-07-14 MED ORDER — FUROSEMIDE 10 MG/ML IJ SOLN
20.0000 mg/h | INTRAVENOUS | Status: DC
  Administered 2023-07-14 (×2): 12 mg/h via INTRAVENOUS
  Administered 2023-07-15 – 2023-07-16 (×3): 20 mg/h via INTRAVENOUS
  Filled 2023-07-14 (×6): qty 20

## 2023-07-14 MED ORDER — MILRINONE LACTATE IN DEXTROSE 20-5 MG/100ML-% IV SOLN
0.3750 ug/kg/min | INTRAVENOUS | Status: AC
Start: 2023-07-14 — End: ?
  Administered 2023-07-14 (×2): 0.25 ug/kg/min via INTRAVENOUS
  Administered 2023-07-15 – 2023-07-20 (×12): 0.375 ug/kg/min via INTRAVENOUS
  Filled 2023-07-14 (×14): qty 100

## 2023-07-14 MED ORDER — ORAL CARE MOUTH RINSE: 15.0000 mL | OROMUCOSAL | Status: DC | PRN

## 2023-07-14 MED ORDER — SODIUM CHLORIDE 0.9 % IV SOLN
100.0000 mg | Freq: Two times a day (BID) | INTRAVENOUS | Status: DC
  Administered 2023-07-14 – 2023-07-15 (×3): 100 mg via INTRAVENOUS
  Filled 2023-07-14 (×3): qty 100

## 2023-07-14 MED ORDER — HEPARIN BOLUS VIA INFUSION
3000.0000 [IU] | Freq: Once | INTRAVENOUS | Status: AC
  Administered 2023-07-14: 3000 [IU] via INTRAVENOUS
  Filled 2023-07-14: qty 3000

## 2023-07-14 MED ORDER — DOXYCYCLINE HYCLATE 100 MG PO TABS: 100.0000 mg | ORAL_TABLET | Freq: Two times a day (BID) | ORAL | Status: DC

## 2023-07-14 MED ORDER — COLCHICINE 0.6 MG PO TABS: 0.6000 mg | ORAL_TABLET | Freq: Every day | ORAL | Status: DC

## 2023-07-14 MED ORDER — PERFLUTREN LIPID MICROSPHERE
1.0000 mL | INTRAVENOUS | Status: AC | PRN
  Administered 2023-07-14: 2 mL via INTRAVENOUS

## 2023-07-14 MED ORDER — SODIUM CHLORIDE 0.9 % IV SOLN: 1.0000 g | INTRAVENOUS | Status: DC

## 2023-07-14 NOTE — Progress Notes (Addendum)
 Advanced Heart Failure Rounding Note  Cardiologist: Gordy Bergamo, MD  AHF: Dr. Gardenia  Chief Complaint: a/c CHF + Afib w/ RVR   Patient Profile   78 y/o male w/ chronic systolic heart failure EF 20-25%, mod-severe MR and CAD s/p recent PCI to LAD c/b coronary perforation and tamponade requiring urgent pericardiocentesis, now readmitted for a/c CHF w/ low output in the setting of new Afib w/ RVR.    Subjective:    PICC placed overnight. Initial Co-ox 39% this morning. LA 2.8>>3.1. LFTs continued to trend up c/w shock liver. SCr 1.0>>1.5   On amio @ 30/hr. Continues in Afib 110s w/ PVCs.   Remains in ER. UOP/ wts not charted. Currently resting w/ CPAP on. Feels miserable, asking when he will get floor bed. Denies CP. N/D resolved.    Objective:   Weight Range: 92.8 kg Body mass index is 28.53 kg/m.   Vital Signs:   Temp:  [97.7 F (36.5 C)-98.4 F (36.9 C)] 97.7 F (36.5 C) (07/09 0542) Pulse Rate:  [27-145] 82 (07/09 0542) Resp:  [15-35] 31 (07/09 0542) BP: (85-129)/(58-111) 99/81 (07/09 0542) SpO2:  [86 %-100 %] 96 % (07/09 0542) FiO2 (%):  [21 %] 21 % (07/08 1946) Weight:  [92.8 kg] 92.8 kg (07/08 1500) Last BM Date : 07/13/23  Weight change: Filed Weights   07/13/23 1500  Weight: 92.8 kg    Intake/Output:   Intake/Output Summary (Last 24 hours) at 07/14/2023 0737 Last data filed at 07/14/2023 0043 Gross per 24 hour  Intake --  Output 300 ml  Net -300 ml      Physical Exam    General:  chronically ill/fatigued appearing, no respiratory difficulty  HEENT: Normal Neck: Supple. JVP to jaw. Carotids 2+ bilat; no bruits. No lymphadenopathy or thyromegaly appreciated. Cor: Irregularly irregular rate and rhythm. Lungs: decreased BS at the bases  Abdomen: Soft, nontender, nondistended. Extremities: No cyanosis, clubbing, rash, edema, cool distal ext + RUE PICC  Neuro: Alert & orientedx3, cranial nerves grossly intact. moves all 4 extremities w/o  difficulty.    Telemetry   Afib 110s, frequent PVCs, personally reviewed   EKG    N/A   Labs    CBC Recent Labs    07/13/23 0958 07/13/23 1006 07/14/23 0229  WBC 14.3*  --  18.1*  HGB 13.7 15.0 13.3  HCT 42.3 44.0 39.7  MCV 108.2*  --  108.5*  PLT 295  --  256   Basic Metabolic Panel Recent Labs    92/91/74 0958 07/13/23 1006 07/13/23 1109 07/14/23 0359  NA 138 136  --  133*  K 4.9 4.9  --  4.7  CL 101 102  --  100  CO2 21*  --   --  21*  GLUCOSE 127* 125*  --  134*  BUN 27* 30*  --  39*  CREATININE 1.09 1.00  --  1.50*  CALCIUM  9.2  --   --  8.3*  MG  --   --  2.2  --    Liver Function Tests Recent Labs    07/13/23 0958 07/14/23 0359  AST 292* 2,466*  ALT 199* 1,383*  ALKPHOS 80 81  BILITOT 1.7* 1.5*  PROT 6.6 6.1*  ALBUMIN 3.6 3.2*   Recent Labs    07/13/23 0958  LIPASE 45   Cardiac Enzymes No results for input(s): CKTOTAL, CKMB, CKMBINDEX, TROPONINI in the last 72 hours.  BNP: BNP (last 3 results) Recent Labs    06/14/23  1200 06/29/23 1703 07/13/23 1130  BNP 657.2* 575.2* 1,339.7*    ProBNP (last 3 results) Recent Labs    11/13/22 1539  PROBNP 400     D-Dimer No results for input(s): DDIMER in the last 72 hours. Hemoglobin A1C No results for input(s): HGBA1C in the last 72 hours. Fasting Lipid Panel No results for input(s): CHOL, HDL, LDLCALC, TRIG, CHOLHDL, LDLDIRECT in the last 72 hours. Thyroid  Function Tests No results for input(s): TSH, T4TOTAL, T3FREE, THYROIDAB in the last 72 hours.  Invalid input(s): FREET3  Other results:   Imaging    US  EKG SITE RITE Result Date: 07/13/2023 If Site Rite image not attached, placement could not be confirmed due to current cardiac rhythm.  CT ABDOMEN PELVIS WO CONTRAST Result Date: 07/13/2023 CLINICAL DATA:  Abdominal pain and vomiting.  Elevated LFTs. EXAM: CT ABDOMEN AND PELVIS WITHOUT CONTRAST TECHNIQUE: Multidetector CT imaging of the  abdomen and pelvis was performed following the standard protocol without IV contrast. RADIATION DOSE REDUCTION: This exam was performed according to the departmental dose-optimization program which includes automated exposure control, adjustment of the mA and/or kV according to patient size and/or use of iterative reconstruction technique. COMPARISON:  CT abdomen pelvis dated 06/29/2023. FINDINGS: Evaluation of this exam is limited in the absence of intravenous contrast. Lower chest: Small bilateral pleural effusions with partial compressive atelectasis of the lower lobes. There is 3 vessel coronary vascular calcification. No intra-abdominal free air or free fluid. Hepatobiliary: Fatty liver. No biliary dilatation. The gallbladder is unremarkable. Pancreas: Unremarkable. No pancreatic ductal dilatation or surrounding inflammatory changes. Spleen: Normal in size without focal abnormality. Adrenals/Urinary Tract: The adrenal glands are unremarkable the kidneys, visualized ureters, and urinary bladder appear unremarkable. Stomach/Bowel: There is sigmoid diverticulosis. There is no bowel obstruction or active inflammation. The appendix is normal. Vascular/Lymphatic: Advanced aortoiliac atherosclerotic disease. The IVC is unremarkable. No portal venous gas. There is no adenopathy. Reproductive: The prostate and seminal vesicles are grossly unremarkable. Other: None Musculoskeletal: Osteopenia with degenerative changes. Similar appearance of L3 compression fracture. No acute osseous pathology. IMPRESSION: 1. No acute intra-abdominal or pelvic pathology. 2. Sigmoid diverticulosis. No bowel obstruction. Normal appendix. 3. Fatty liver. 4. Small bilateral pleural effusions with partial compressive atelectasis of the lower lobes. 5.  Aortic Atherosclerosis (ICD10-I70.0). Electronically Signed   By: Vanetta Chou M.D.   On: 07/13/2023 12:34   DG Chest 1 View Result Date: 07/13/2023 CLINICAL DATA:  Atrial fibrillation  EXAM: CHEST  1 VIEW COMPARISON:  Chest x-ray performed July 01, 2023 FINDINGS: Heart is enlarged. Interstitial prominence. A small right pleural effusion with associated consolidation is present. Minimal blunting of the left costophrenic angle. No pneumothorax. IMPRESSION: 1. Small right pleural effusion with associated consolidation. 2. Trace left pleural effusion. 3. Interstitial prominence, possibly sequelae of pulmonary vascular congestion. Electronically Signed   By: Maude Naegeli M.D.   On: 07/13/2023 10:23     Medications:     Scheduled Medications:  budesonide   0.5 mg Nebulization BID   buPROPion   150 mg Oral q morning   Chlorhexidine  Gluconate Cloth  6 each Topical Daily   clopidogrel   75 mg Oral Daily   ezetimibe   10 mg Oral Daily   furosemide   80 mg Intravenous BID   pantoprazole   40 mg Oral QAC breakfast   Pirfenidone   801 mg Oral TID WC   rosuvastatin   20 mg Oral Daily   sodium chloride  flush  10-40 mL Intracatheter Q12H   sodium chloride  flush  3 mL  Intravenous Q12H    Infusions:  amiodarone  30 mg/hr (07/14/23 0233)   heparin  1,600 Units/hr (07/14/23 0406)    PRN Medications: acetaminophen  **OR** acetaminophen , albuterol , ondansetron  (ZOFRAN ) IV, polyethylene glycol, sodium chloride  flush    Assessment/Plan   1. Acute on chronic systolic CHF w/ Low Output: Ischemic cardiomyopathy.  Last echo in 6/25 with EF 25-30%, mild RV dysfunction, moderate MR.  He is volume overloaded on exam, suspect volume overload/CHF is the cause of nausea, elevated LFTs, and increased dyspnea.  This may have been triggered by the onset of atrial fibrillation (he does not feel palpitations so unsure when it began). Now has PICC, Initial Co-ox 39% this morning. LA increasing, 2.8>>3.1. AST 292>>2466, ALT 199>>1383. SCr 1.0>>1.5.  - start Milrinone  0.25 mcg/kg/min - set up CVP monitoring to guide further diuresis  - continue amio gtt for rate control   - Hold bisoprolol  with  low output.  -  Repeat echo to rule out recurrent pericardial effusion and also to assess MR.  2. Atrial fibrillation: New diagnosis.  Noted to have AF with RVR when he arrived in CHF clinic today.  This may have triggered CHF exacerbation.  - Control rate with amiodarone  gtt.  - For now, continue heparin  gtt.  Eventually will need Eliquis.  - Once volume better-controlled, proceed with TEE-guided DCCV.  - Eventual EP evaluation for AF ablation.  3. CAD: Has CTO of RCA.  In 6/25, had complicated PCI to severe proximal LAD stenosis with orbital atherectomy and DES.  This was complicated by coronary perforation and tamponade. No chest pain.  - He is on Plavix  and ASA though he had not taken for several days due to nausea.  Will plan to continue Plavix .  - Will stop ASA after 1 month post-PCI given need for anticoagulation.  - Continue statin.  4. H/o pericardial tamponade: Due to coronary perforation in 6/25 associated with LAD PCI. S/p pericardiocentesis.  - order 2D echo to ensure no recurrent pericardial effusion.  5. Mitral regurgitation: Moderate-severe by TEE in 5/25 and being considered for mTEER, but noted as no more than moderate on recent echoes.   - Echo this admission to reassess mitral regurgitation.  6. 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 AST 292>>2466, ALT 199>>1383 - starting milrinone   - continue diuresis  7. 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  - mexiletine discontinued on admit now that he is on amiodarone .  8. Idiopathic pulmonary fibrosis: Continue pirfenidone . - currently on IV amio for rate/rhythm control, long term use not ideal   Length of Stay: 1  Brittainy Simmons, PA-C  07/14/2023, 7:37 AM  Advanced Heart Failure Team Pager [phone] (M-F; 7a - 5p)  Please contact [facility] for night-coverage after hours (5p -7a ) and weekends on [email or web address]  Patient seen with PA, I formulated the plan and agree with the above note.   He remains in atrial  fibrillation rate 110s-120s on amiodarone  gtt 30 mg/hr and heparin  gtt.   Short of breath with orthopnea.  PICC placed overnight, co-ox this am 39% with rising LFTs and creatinine up to 1.5.  SBP stable 100s. Lactate rising. Some UOP charted but not vigorous.   General: NAD Neck: JVP 14-16 cm, no thyromegaly or thyroid  nodule.  Lungs: Decreased at bases.  CV: Nondisplaced PMI.  Heart mildly tachy, irregular S1/S2, no S3/S4, 2/6 HSM LLSB/apex.  Trace ankle edema.   Abdomen: Soft, nontender, no hepatosplenomegaly, no distention.  Skin: Intact without lesions or rashes.  Neurologic: Alert  and oriented x 3.  Psych: Normal affect. Extremities: No clubbing or cyanosis.  HEENT: Normal.   Low output HF/cardiogenic shock with co-ox 39% this morning and rising lactate/LFTs/creatinine. He remains significantly volume overloaded. BP currently stable. Suspect this was triggered by new-onset AF.  - Start milrinone  0.25 mcg/kg/min.  - Once milrinone  is begun, will try to push diuresis given orthopnea => Lasix  80 mg IV x 1 then start gtt at 12 mg/hr.  - Needs repeat echo as above to rule out re-accumulation of pericardial effusion and also to reassess severity of MR.   He is in AF with RVR, will increase amiodarone  gtt to 60 mg/hr and continue heparin  gtt.  He will need eventual cardioversion when stabilized.   Continue ASA/Plavix  with recent stent, will stop ASA at 1 month.   WBCs up to 18K, he has some consolidation at right base on CXR.  Will empirically cover with ceftriaxone /doxycycline .  Send PCT and blood cultures.   He will need CCU bed rather than progressive unit, looks worse this morning.   CRITICAL CARE Performed by: Ezra Shuck  Total critical care time: 40 minutes  Critical care time was exclusive of separately billable procedures and treating other patients.  Critical care was necessary to treat or prevent imminent or life-threatening deterioration.  Critical care was time spent  personally by me on the following activities: development of treatment plan with patient and/or surrogate as well as nursing, discussions with consultants, evaluation of patient's response to treatment, examination of patient, obtaining history from patient or surrogate, ordering and performing treatments and interventions, ordering and review of laboratory studies, ordering and review of radiographic studies, pulse oximetry and re-evaluation of patient's condition.  Ezra Shuck 07/14/2023 8:15 AM

## 2023-07-14 NOTE — Progress Notes (Signed)
 PHARMACY - ANTICOAGULATION CONSULT NOTE  Pharmacy Consult for heparin  Indication: atrial fibrillation  Labs: Recent Labs    07/13/23 0958 07/13/23 1006 07/14/23 0229  HGB 13.7 15.0 13.3  HCT 42.3 44.0 39.7  PLT 295  --  256  HEPARINUNFRC  --   --  0.17*  CREATININE 1.09 1.00  --    Assessment: 77yo male subtherapeutic on heparin  with initial dosing for Afib; no infusion issues or signs of bleeding per RN.  Goal of Therapy:  Heparin  level 0.3-0.7 units/ml   Plan:  3000 units heparin  bolus. Increase heparin  infusion by 3 units/kg/hr to 1600 units/hr. Check level in 6 hours.   Marvetta Dauphin, PharmD, BCPS 07/14/2023 3:40 AM

## 2023-07-14 NOTE — ED Notes (Signed)
 Pt given OJ and water , also a pillow for comfort

## 2023-07-14 NOTE — Progress Notes (Signed)
 ANTICOAGULATION CONSULT NOTE  Pharmacy Consult for heparin  Indication: atrial fibrillation  No Known Allergies  Patient Measurements: Height: 5' 11 (180.3 cm) Weight: 99.2 kg (218 lb 11.1 oz) IBW/kg (Calculated) : 75.3 Heparin  Dosing Weight: 93 kg   Vital Signs: Temp: 97.7 F (36.5 C) (07/09 0542) Temp Source: Oral (07/09 0542) BP: 101/79 (07/09 1230) Pulse Rate: 165 (07/09 1230)  Labs: Recent Labs    07/13/23 0958 07/13/23 1006 07/14/23 0229 07/14/23 0359 07/14/23 0958 07/14/23 1143  HGB 13.7 15.0 13.3  --   --   --   HCT 42.3 44.0 39.7  --   --   --   PLT 295  --  256  --   --   --   HEPARINUNFRC  --   --  0.17*  --  >1.10* 0.35  CREATININE 1.09 1.00  --  1.50*  --   --     Estimated Creatinine Clearance: 48.7 mL/min (A) (by C-G formula based on SCr of 1.5 mg/dL (H)).  Medical History: 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   Alcoholic polyneuropathy (HCC)    Allergy    Asthma    BPH (benign prostatic hyperplasia)    Cardiogenic shock (HCC) 06/29/2023   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 failure) (HCC) 04/23/2023   COPD (chronic obstructive pulmonary disease) (HCC)    Depression    Depression    Diverticulosis    Dyspnea on exertion    Elevated LDL cholesterol level    GERD with stricture    Gout    Hiatal hernia    Hypertension    Hypotension 06/29/2023   Hypotension after procedure 06/29/2023   IPF (idiopathic pulmonary fibrosis) (HCC) 04/23/2023   Kidney stones    2 times - passed stones, no surgery required   Lumbar and sacral osteoarthritis    Mitral regurgitation 04/23/2023   OSA on CPAP 04/23/2023   Peripheral polyneuropathy    Shock (HCC) 07/01/2023   Sleep apnea    wears CPAP   Wears hearing aid    Bil    Medications:  See MAR  Assessment: 78 yo male presents with new onset atrial fibrillation.  Not on anticoagulation prior to admission.  Pharmacy consulted for heparin   dosing.  Heparin  level this morning >1.1 - unclear how level was drawn as patient was transferred from ED.  Redraw of 0.35 is therapeutic 1600 units/hr.  CBC stable - Hgb 13.3, pltc 256.  No issues.   Goal of Therapy:  Heparin  level 0.3-0.7 units/ml Monitor platelets by anticoagulation protocol: Yes   Plan:  Continue heparin  infusion 1600 units/hr Daily CBC, anti-Xa level Monitor for s/sx of bleeding  Maurilio Fila, PharmD Clinical Pharmacist 07/14/2023  1:36 PM

## 2023-07-14 NOTE — Progress Notes (Signed)
 Called about mild hypotension and CXR with effusions. Reviewed CXR, assessed patient. BP 91/54 (66), HR 80s, 99%/3L [place]. Lung sounds equal, crackles b/l bases. On lasix  12 mg/h, amio 60 mg/h,heparin  1600 unit/h, milrinone  0.25 mcg/min. Lactate down trending earlier today. Patient alert and oriented. LE warm/well perfused. Made 500 UOP since shift change. Check Coox, lactate, BMP and Mg now. Make sure lactate continuing to down trend. Otherwise appears stable.

## 2023-07-14 NOTE — Plan of Care (Signed)
   Problem: Clinical Measurements: Goal: Respiratory complications will improve Outcome: Progressing

## 2023-07-14 NOTE — Progress Notes (Signed)
  PROGRESS NOTE  Patient was admitted yesterday for CHF, A Fib, CAD. Seen by Cardiology team early this morning. They are going to take over as primary and transfer him to ICU today. TRH to sign off.    Delon Hoe, DO [facility] 07/14/2023, 8:17 AM  Available via Epic secure chat 7am-7pm After these hours, please refer to coverage provider listed on [email or web address]

## 2023-07-15 DIAGNOSIS — I493 Ventricular premature depolarization: Secondary | ICD-10-CM

## 2023-07-15 DIAGNOSIS — I4891 Unspecified atrial fibrillation: Secondary | ICD-10-CM | POA: Diagnosis not present

## 2023-07-15 DIAGNOSIS — I5023 Acute on chronic systolic (congestive) heart failure: Secondary | ICD-10-CM | POA: Diagnosis not present

## 2023-07-15 LAB — COMPREHENSIVE METABOLIC PANEL WITH GFR
ALT: 1270 U/L — ABNORMAL HIGH (ref 0–44)
AST: 1480 U/L — ABNORMAL HIGH (ref 15–41)
Albumin: 3.2 g/dL — ABNORMAL LOW (ref 3.5–5.0)
Alkaline Phosphatase: 80 U/L (ref 38–126)
Anion gap: 14 (ref 5–15)
BUN: 30 mg/dL — ABNORMAL HIGH (ref 8–23)
CO2: 24 mmol/L (ref 22–32)
Calcium: 8.2 mg/dL — ABNORMAL LOW (ref 8.9–10.3)
Chloride: 96 mmol/L — ABNORMAL LOW (ref 98–111)
Creatinine, Ser: 1.17 mg/dL (ref 0.61–1.24)
GFR, Estimated: >60 mL/min (ref >60)
Glucose, Bld: 136 mg/dL — ABNORMAL HIGH (ref 70–99)
Potassium: 3.5 mmol/L (ref 3.5–5.1)
Sodium: 134 mmol/L — ABNORMAL LOW (ref 135–145)
Total Bilirubin: 1.4 mg/dL — ABNORMAL HIGH (ref 0.0–1.2)
Total Protein: 6.2 g/dL — ABNORMAL LOW (ref 6.5–8.1)

## 2023-07-15 LAB — HEPARIN LEVEL (UNFRACTIONATED): Heparin Unfractionated: 0.48 [IU]/mL (ref 0.30–0.70)

## 2023-07-15 LAB — CBC
HCT: 38.4 % — ABNORMAL LOW (ref 39.0–52.0)
Hemoglobin: 13.1 g/dL (ref 13.0–17.0)
MCH: 36.3 pg — ABNORMAL HIGH (ref 26.0–34.0)
MCHC: 34.1 g/dL (ref 30.0–36.0)
MCV: 106.4 fL — ABNORMAL HIGH (ref 80.0–100.0)
Platelets: 205 K/uL (ref 150–400)
RBC: 3.61 MIL/uL — ABNORMAL LOW (ref 4.22–5.81)
RDW: 14.2 % (ref 11.5–15.5)
WBC: 12.3 K/uL — ABNORMAL HIGH (ref 4.0–10.5)
nRBC: 0.2 % (ref 0.0–0.2)

## 2023-07-15 LAB — COOXEMETRY PANEL
Carboxyhemoglobin: 1.5 % (ref 0.5–1.5)
Carboxyhemoglobin: 1.7 % — ABNORMAL HIGH (ref 0.5–1.5)
Carboxyhemoglobin: 1.7 % — ABNORMAL HIGH (ref 0.5–1.5)
Methemoglobin: 2.3 % — ABNORMAL HIGH (ref 0.0–1.5)
Methemoglobin: <0.7 % (ref 0.0–1.5)
Methemoglobin: <0.7 % (ref 0.0–1.5)
O2 Saturation: 51.4 %
O2 Saturation: 61 %
O2 Saturation: 62.7 %
Total hemoglobin: 13 g/dL (ref 12.0–16.0)
Total hemoglobin: 13.3 g/dL (ref 12.0–16.0)
Total hemoglobin: 13.4 g/dL (ref 12.0–16.0)

## 2023-07-15 LAB — MAGNESIUM
Magnesium: 1.9 mg/dL (ref 1.7–2.4)
Magnesium: 2.2 mg/dL (ref 1.7–2.4)

## 2023-07-15 LAB — BASIC METABOLIC PANEL WITH GFR
Anion gap: 16 — ABNORMAL HIGH (ref 5–15)
BUN: 34 mg/dL — ABNORMAL HIGH (ref 8–23)
CO2: 26 mmol/L (ref 22–32)
Calcium: 8.1 mg/dL — ABNORMAL LOW (ref 8.9–10.3)
Chloride: 95 mmol/L — ABNORMAL LOW (ref 98–111)
Creatinine, Ser: 1.25 mg/dL — ABNORMAL HIGH (ref 0.61–1.24)
GFR, Estimated: 59 mL/min — ABNORMAL LOW (ref >60)
Glucose, Bld: 137 mg/dL — ABNORMAL HIGH (ref 70–99)
Potassium: 3.2 mmol/L — ABNORMAL LOW (ref 3.5–5.1)
Sodium: 137 mmol/L (ref 135–145)

## 2023-07-15 LAB — LACTIC ACID, PLASMA: Lactic Acid, Venous: 1.3 mmol/L (ref 0.5–1.9)

## 2023-07-15 LAB — POTASSIUM: Potassium: 3.2 mmol/L — ABNORMAL LOW (ref 3.5–5.1)

## 2023-07-15 MED ORDER — METOLAZONE 5 MG PO TABS
5.0000 mg | ORAL_TABLET | Freq: Once | ORAL | Status: AC
  Administered 2023-07-15: 5 mg via ORAL
  Filled 2023-07-15: qty 1

## 2023-07-15 MED ORDER — DIGOXIN 125 MCG PO TABS
0.1250 mg | ORAL_TABLET | Freq: Every day | ORAL | Status: DC
  Administered 2023-07-15 – 2023-07-18 (×4): 0.125 mg via ORAL
  Filled 2023-07-15 (×4): qty 1

## 2023-07-15 MED ORDER — POTASSIUM CHLORIDE CRYS ER 20 MEQ PO TBCR
60.0000 meq | EXTENDED_RELEASE_TABLET | Freq: Once | ORAL | Status: AC
  Administered 2023-07-15: 60 meq via ORAL
  Filled 2023-07-15: qty 3

## 2023-07-15 MED ORDER — FUROSEMIDE 10 MG/ML IJ SOLN
120.0000 mg | Freq: Once | INTRAVENOUS | Status: AC
  Administered 2023-07-15: 120 mg via INTRAVENOUS
  Filled 2023-07-15: qty 12

## 2023-07-15 MED ORDER — ASPIRIN 81 MG PO TBEC
81.0000 mg | DELAYED_RELEASE_TABLET | Freq: Every day | ORAL | Status: DC
  Administered 2023-07-15 – 2023-07-18 (×4): 81 mg via ORAL
  Filled 2023-07-15 (×4): qty 1

## 2023-07-15 MED ORDER — POTASSIUM CHLORIDE 10 MEQ/50ML IV SOLN
10.0000 meq | INTRAVENOUS | Status: AC
  Administered 2023-07-15 – 2023-07-16 (×2): 10 meq via INTRAVENOUS
  Filled 2023-07-15 (×2): qty 50

## 2023-07-15 MED ORDER — MAGNESIUM SULFATE 2 GM/50ML IV SOLN
2.0000 g | Freq: Once | INTRAVENOUS | Status: AC
  Administered 2023-07-15: 2 g via INTRAVENOUS
  Filled 2023-07-15: qty 50

## 2023-07-15 MED ORDER — POTASSIUM CHLORIDE 20 MEQ PO PACK: 60.0000 meq | PACK | Freq: Once | ORAL | Status: DC

## 2023-07-15 MED ORDER — MEXILETINE HCL 150 MG PO CAPS
150.0000 mg | ORAL_CAPSULE | Freq: Three times a day (TID) | ORAL | Status: DC
  Administered 2023-07-15 – 2023-07-18 (×11): 150 mg via ORAL
  Filled 2023-07-15 (×11): qty 1

## 2023-07-15 NOTE — Progress Notes (Signed)
   07/15/23 2046  BiPAP/CPAP/SIPAP  BiPAP/CPAP/SIPAP Pt Type Adult  BiPAP/CPAP/SIPAP Resmed  Mask Type  (nasal pillows)  Dentures removed? Not applicable  EPAP 10 cmH2O  FiO2 (%) 21 %  Patient Home Machine Yes  Safety Check Completed by RT for Home Unit Yes, no issues noted  Patient Home Mask Yes  Patient Home Tubing Yes  Auto Titrate No  CPAP/SIPAP surface wiped down Yes  Device Plugged into RED Power Outlet Yes  BiPAP/CPAP /SiPAP Vitals  Temp (!) 96.3 F (35.7 C)  Pulse Rate 79  Resp 19  SpO2 99 %  MEWS Score/Color  MEWS Score 1  MEWS Score Color Green

## 2023-07-15 NOTE — Plan of Care (Signed)
  Problem: Education: Goal: Knowledge of General Education information will improve Description: Including pain rating scale, medication(s)/side effects and non-pharmacologic comfort measures Outcome: Progressing   Problem: Health Behavior/Discharge Planning: Goal: Ability to manage health-related needs will improve Outcome: Progressing   Problem: Clinical Measurements: Goal: Ability to maintain clinical measurements within normal limits will improve Outcome: Progressing Goal: Will remain free from infection Outcome: Progressing Goal: Diagnostic test results will improve Outcome: Progressing Goal: Respiratory complications will improve Outcome: Progressing   Problem: Coping: Goal: Level of anxiety will decrease Outcome: Progressing   Problem: Elimination: Goal: Will not experience complications related to urinary retention Outcome: Progressing   Problem: Pain Managment: Goal: General experience of comfort will improve and/or be controlled Outcome: Progressing   Problem: Safety: Goal: Ability to remain free from injury will improve Outcome: Progressing

## 2023-07-15 NOTE — Progress Notes (Signed)
 Lactate normalized, coox looks fine. K 60 PO and Mg 2 gm IV ordered.

## 2023-07-15 NOTE — Progress Notes (Signed)
 ANTICOAGULATION CONSULT NOTE  Pharmacy Consult for heparin  Indication: atrial fibrillation  No Known Allergies  Patient Measurements: Height: 5' 11 (180.3 cm) Weight: 79 kg (174 lb 2.6 oz) IBW/kg (Calculated) : 75.3 Heparin  Dosing Weight: 93 kg   Vital Signs: Temp: 97.2 F (36.2 C) (07/10 0830) Temp Source: Oral (07/10 0400) BP: 105/71 (07/10 0830) Pulse Rate: 81 (07/10 0830)  Labs: Recent Labs    07/13/23 0958 07/13/23 1006 07/13/23 1006 07/14/23 0229 07/14/23 0359 07/14/23 0958 07/14/23 1143 07/14/23 2342 07/15/23 0514  HGB 13.7 15.0  --  13.3  --   --   --   --  13.1  HCT 42.3 44.0  --  39.7  --   --   --   --  38.4*  PLT 295  --   --  256  --   --   --   --  205  HEPARINUNFRC  --   --    < > 0.17*  --  >1.10* 0.35  --  0.48  CREATININE 1.09 1.00  --   --  1.50*  --   --  1.25* 1.17   < > = values in this interval not displayed.    Estimated Creatinine Clearance: 55.4 mL/min (by C-G formula based on SCr of 1.17 mg/dL).  Medical History: 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   Alcoholic polyneuropathy (HCC)    Allergy    Asthma    BPH (benign prostatic hyperplasia)    Cardiogenic shock (HCC) 06/29/2023   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 failure) (HCC) 04/23/2023   COPD (chronic obstructive pulmonary disease) (HCC)    Depression    Depression    Diverticulosis    Dyspnea on exertion    Elevated LDL cholesterol level    GERD with stricture    Gout    Hiatal hernia    Hypertension    Hypotension 06/29/2023   Hypotension after procedure 06/29/2023   IPF (idiopathic pulmonary fibrosis) (HCC) 04/23/2023   Kidney stones    2 times - passed stones, no surgery required   Lumbar and sacral osteoarthritis    Mitral regurgitation 04/23/2023   OSA on CPAP 04/23/2023   Peripheral polyneuropathy    Shock (HCC) 07/01/2023   Sleep apnea    wears CPAP   Wears hearing aid    Bil     Medications:  See MAR  Assessment: 78 yo male presents with new onset atrial fibrillation.  Not on anticoagulation prior to admission.  Pharmacy consulted for heparin  dosing.  Heparin  level 0.48 today is therapeutic on 1600 units/hr.  CBC stable, no issues.    Goal of Therapy:  Heparin  level 0.3-0.7 units/ml Monitor platelets by anticoagulation protocol: Yes   Plan:  Continue heparin  infusion 1600 units/hr Daily CBC, anti-Xa level Monitor for s/sx of bleeding F/u to transition to PO  Maurilio Fila, PharmD Clinical Pharmacist 07/15/2023  10:39 AM

## 2023-07-15 NOTE — Progress Notes (Addendum)
 Advanced Heart Failure Rounding Note  Cardiologist: Gordy Bergamo, MD  AHF: Dr. Gardenia  Chief Complaint: a/c CHF + Afib w/ RVR   Patient Profile   78 y/o male w/ chronic systolic heart failure EF 20-25%, mod-severe MR and CAD s/p recent PCI to LAD c/b coronary perforation and tamponade requiring urgent pericardiocentesis, now readmitted for a/c CHF w/ low output in the setting of new Afib w/ RVR.    Subjective:    7/9 Transferred to the ICU for Cardiogenic shock. Swan placed at bedside.   CO-OX 61% on 0.25 milrinone  with Fick CI of 1.6.  Swan #s 11 am CVP 20  PA 32/21 CO 3.45 CI 1.58  Diuresed 4.8L with lasix  gtt  Converted to sinus rhythm yesterday afternoon.  States he feels okay. No dyspnea at rest. Wife reports he's had significant functional decline over the last year, more notable over the last month.   Objective:   Weight Range: 79 kg Body mass index is 24.29 kg/m.   Vital Signs:   Temp:  [96.8 F (36 C)-98.5 F (36.9 C)] 97.2 F (36.2 C) (07/10 0830) Pulse Rate:  [66-165] 81 (07/10 0830) Resp:  [0-34] 19 (07/10 0830) BP: (81-155)/(53-113) 105/71 (07/10 0830) SpO2:  [84 %-100 %] 99 % (07/10 0830) Weight:  [79 kg-99.2 kg] 79 kg (07/10 0500) Last BM Date :  (PTA)  Weight change: Filed Weights   07/13/23 1500 07/14/23 1156 07/15/23 0500  Weight: 92.8 kg 99.2 kg 79 kg    Intake/Output:   Intake/Output Summary (Last 24 hours) at 07/15/2023 1036 Last data filed at 07/15/2023 0800 Gross per 24 hour  Intake 3574.44 ml  Output 4647 ml  Net -1072.56 ml      Physical Exam    General:  Fatigued, chronically ill appearing Neck: JVP to ear, R internal jugular Swan Cor: Regular rate & rhythm. No rubs, gallops or murmurs. Lungs: clear Abdomen: soft, nontender, nondistended.  Extremities: no edema, legs are warm Neuro: alert & orientedx3. Affect flat.    Telemetry   SR 80s, PVC burden improved after restarting mexiletine   EKG    N/A    Labs    CBC Recent Labs    07/14/23 0229 07/15/23 0514  WBC 18.1* 12.3*  HGB 13.3 13.1  HCT 39.7 38.4*  MCV 108.5* 106.4*  PLT 256 205   Basic Metabolic Panel Recent Labs    92/90/74 2342 07/15/23 0514  NA 137 134*  K 3.2* 3.5  CL 95* 96*  CO2 26 24  GLUCOSE 137* 136*  BUN 34* 30*  CREATININE 1.25* 1.17  CALCIUM  8.1* 8.2*  MG 1.9 2.2   Liver Function Tests Recent Labs    07/14/23 0359 07/15/23 0514  AST 2,466* 1,480*  ALT 1,383* 1,270*  ALKPHOS 81 80  BILITOT 1.5* 1.4*  PROT 6.1* 6.2*  ALBUMIN 3.2* 3.2*   Recent Labs    07/13/23 0958  LIPASE 45   Cardiac Enzymes No results for input(s): CKTOTAL, CKMB, CKMBINDEX, TROPONINI in the last 72 hours.  BNP: BNP (last 3 results) Recent Labs    06/14/23 1200 06/29/23 1703 07/13/23 1130  BNP 657.2* 575.2* 1,339.7*    ProBNP (last 3 results) Recent Labs    11/13/22 1539  PROBNP 400     D-Dimer No results for input(s): DDIMER in the last 72 hours. Hemoglobin A1C No results for input(s): HGBA1C in the last 72 hours. Fasting Lipid Panel No results for input(s): CHOL, HDL, LDLCALC, TRIG, CHOLHDL, LDLDIRECT  in the last 72 hours. Thyroid  Function Tests No results for input(s): TSH, T4TOTAL, T3FREE, THYROIDAB in the last 72 hours.  Invalid input(s): FREET3  Other results:   Imaging    DG Chest Port 1 View Result Date: 07/14/2023 CLINICAL DATA:  Line placement EXAM: PORTABLE CHEST 1 VIEW COMPARISON:  07/13/2023, 07/01/2023 FINDINGS: Cardiomegaly with vascular congestion, increased pleural effusions and diffuse interstitial dish ule and patchy lower lung opacity, probably due to edema. New right IJ Swan-Ganz catheter with slight peripheral positioning of the catheter tip, about 5 cm peripheral to the right hilus. Additional right upper extremity central venous catheter with tip over the SVC. IMPRESSION: 1. New right IJ Swan-Ganz catheter with slight peripheral  positioning of the catheter tip, about 5 cm peripheral to the right hilus. 2. New right upper extremity central venous catheter with tip at the SVC. 3. Cardiomegaly with increased vascular congestion, increased pleural effusions and diffuse interstitial opacity, probably due to edema. 4. These results will be called to the ordering clinician or representative by the Radiologist Assistant, and communication documented in the PACS or [employer]. Electronically Signed   By: Luke Bun M.D.   On: 07/14/2023 19:01   ECHOCARDIOGRAM LIMITED Result Date: 07/14/2023    ECHOCARDIOGRAM LIMITED REPORT   Patient Name:   Deavin Jashan Cotten. Date of Exam: 07/14/2023 Medical Rec #:  [id number]         Height:       71.0 in Accession #:    [id number]        Weight:       204.6 lb Date of Birth:  June 11, 1945         BSA:          2.129 m Patient Age:    89 years          BP:           116/82 mmHg Patient Gender: M                 HR:           107 bpm. Exam Location:  Inpatient Procedure: Limited Echo, Cardiac Doppler, Color Doppler and Intracardiac            Opacification Agent (Both Spectral and Color Flow Doppler were            utilized during procedure). Indications:    Mitral Valve Disorder I05.9  History:        Patient has prior history of Echocardiogram examinations, most                 recent 07/01/2023. CAD, Mitral Valve Disease; Risk                 Factors:Hypertension, Diabetes and Sleep Apnea.  Sonographer:    Jayson Gaskins Referring Phys: [phone] LINDSAY B ROBERTS IMPRESSIONS  1. Limited study to assess MR; full doppler not performed.  2. Left ventricular ejection fraction, by estimation, is <20%. The left ventricle has severely decreased function. The left ventricle demonstrates global hypokinesis. The left ventricular internal cavity size was severely dilated. Left ventricular diastolic parameters are indeterminate.  3. Right ventricular systolic function is mildly reduced. The right ventricular size is normal.   4. Left atrial size was severely dilated.  5. The mitral valve is normal in structure. Moderate mitral valve regurgitation. No evidence of mitral stenosis.  6. Tricuspid valve regurgitation is moderate.  7. The aortic valve has an indeterminant number of cusps.  8. The inferior vena cava is dilated in size with <50% respiratory variability, suggesting right atrial pressure of 15 mmHg. FINDINGS  Left Ventricle: Left ventricular ejection fraction, by estimation, is <20%. The left ventricle has severely decreased function. The left ventricle demonstrates global hypokinesis. The left ventricular internal cavity size was severely dilated. There is no left ventricular hypertrophy. Left ventricular diastolic parameters are indeterminate. Right Ventricle: The right ventricular size is normal. Right ventricular systolic function is mildly reduced. Left Atrium: Left atrial size was severely dilated. Right Atrium: Right atrial size was normal in size. Pericardium: There is no evidence of pericardial effusion. Mitral Valve: The mitral valve is normal in structure. Moderate mitral valve regurgitation. No evidence of mitral valve stenosis. Tricuspid Valve: The tricuspid valve is normal in structure. Tricuspid valve regurgitation is moderate . No evidence of tricuspid stenosis. Aortic Valve: The aortic valve has an indeterminant number of cusps. Pulmonic Valve: The pulmonic valve was not well visualized. Aorta: The aortic root is normal in size and structure. Venous: The inferior vena cava is dilated in size with less than 50% respiratory variability, suggesting right atrial pressure of 15 mmHg. Additional Comments: Limited study to assess MR; full doppler not performed.  LEFT VENTRICLE PLAX 2D LVIDd:         6.70 cm LVIDs:         6.20 cm LV PW:         0.80 cm LV IVS:        0.70 cm LVOT diam:     1.80 cm LVOT Area:     2.54 cm  LEFT ATRIUM              Index LA Vol (A2C):   79.6 ml  37.39 ml/m LA Vol (A4C):   103.0 ml 48.38  ml/m LA Biplane Vol: 98.2 ml  46.13 ml/m   SHUNTS Systemic Diam: 1.80 cm Redell Shallow MD Electronically signed by Redell Shallow MD Signature Date/Time: 07/14/2023/1:18:11 PM    Final      Medications:     Scheduled Medications:  budesonide   0.5 mg Nebulization BID   buPROPion   150 mg Oral q morning   Chlorhexidine  Gluconate Cloth  6 each Topical Daily   clopidogrel   75 mg Oral Daily   ezetimibe   10 mg Oral Daily   mexiletine  150 mg Oral Q8H   pantoprazole   40 mg Oral QAC breakfast   Pirfenidone   801 mg Oral TID WC   sodium chloride  flush  10-40 mL Intracatheter Q12H   sodium chloride  flush  3 mL Intravenous Q12H    Infusions:  amiodarone  60 mg/hr (07/15/23 1010)   cefTRIAXone  (ROCEPHIN )  IV 2 g (07/15/23 0937)   doxycycline  (VIBRAMYCIN ) IV Stopped (07/14/23 2255)   furosemide  (LASIX ) 200 mg in dextrose  5 % 100 mL (2 mg/mL) infusion 12 mg/hr (07/15/23 0800)   heparin  1,600 Units/hr (07/15/23 0800)   milrinone  0.25 mcg/kg/min (07/15/23 0800)    PRN Medications: acetaminophen  **OR** acetaminophen , albuterol , ondansetron  (ZOFRAN ) IV, mouth rinse, polyethylene glycol, sodium chloride  flush    Assessment/Plan   1. Acute on chronic systolic CHF w/ Low Output:  - Ischemic cardiomyopathy.   - Echo in 6/25 with EF 25-30%, mild RV dysfunction, moderate MR.   - Readmitted with a/c CHF with low-output in setting of Afib with RVR. Lactic acid peaked at 3.1, now cleared. - Echo w/ EF now < 20%, RV mildly reduced, moderate MR - CO-OX 61% with Fick CI of 1.6, TD CI  also 1.6. SVR 1500. Increase milrinone  to 0.375. Recheck hemodynamics in about 2 hrs. - CVP 20. Responding well to lasix  gtt. Continue lasix  gtt at 20/hr, give 5 mg metolazone .  2. Atrial fibrillation: New diagnosis. - Continue amiodarone  gtt at 60/hr while on inotrope support - Converted to SR 07/09. Maintaining SR today - For now, continue heparin  gtt.  Eventually will need Eliquis .    3. CAD: Has CTO of RCA.  In  6/25, had complicated PCI to severe proximal LAD stenosis with orbital atherectomy and DES.  This was complicated by coronary perforation and tamponade.  - He is on Plavix  and ASA though he had not taken for several days due to nausea.   - Will stop ASA after 1 month post-PCI given need for anticoagulation. Will need to continue Plavix .  - Continue statin.   4. H/o pericardial tamponade: Due to coronary perforation in 6/25 associated with LAD PCI. S/p pericardiocentesis.   5. Mitral regurgitation: Moderate-severe by TEE in 5/25 and being considered for mTEER, but noted as no more than moderate on recent echoes.   - Echo this admission w/ moderate MR  6. 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  - Starting to improve - continue to support cardiac output with milrinone  and diurese  7. 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  - mexiletine discontinued on admit now that he is on amiodarone . Added back on 07/09 d/t frequent PVCs. Burden improved today.  8. Idiopathic pulmonary fibrosis: Continue pirfenidone . - currently on IV amio for rate/rhythm control, long term use not ideal   9. ID -WBCs up to 18K>> 12K -Some suspicion for consolidation R base on CXR. PCT negative and BC X 2 NG. - Had been covered empricially with ceftriaxone  and doxy. Stop Abx.  PT/OT evaluation. May need rehab at discharge. Significant deconditioning and functional decline over recent months.  Length of Stay: 2  FINCH, MANUELITA SAILOR, PA-C  07/15/2023, 10:36 AM  Advanced Heart Failure Team Pager [phone] (M-F; 7a - 5p)  Please contact [facility] for night-coverage after hours (5p -7a ) and weekends on [email or web address]  Agree with above.   Swan placed yesterday. Remains on milrinone . CO improved but still low with CI 1.5-1.6   Feels better. SCR improving and diuresing well   Back in NSR on IV amio   General:  Sitting up in bed  No resp difficulty HEENT: normal Neck: supple. RIJ swan  Cor: REG +  s3 Lungs: clear Abdomen: soft, nontender, nondistended. No hepatosplenomegaly. No bruits or masses. Good bowel sounds. Extremities: no cyanosis, clubbing, rash, 1+ edema Neuro: alert & orientedx3, cranial nerves grossly intact. moves all 4 extremities w/o difficulty. Affect pleasant  Remains very tenous. CO still low. SVR high. Will increase milrinone .Can add NE as needed but will hold for now as clinically improving and don't want to exacerbate return to AF.   Continue to diurese. Continue IV heparin  and amio. Suspect he has tachy CM on top of iCM  Very weak. Consult PT/OT for OOB to chair  Discussed with his daughter at bedside.   CRITICAL CARE Performed by: Cherrie Sieving  Total critical care time: 42 minutes  Critical care time was exclusive of separately billable procedures and treating other patients.  Critical care was necessary to treat or prevent imminent or life-threatening deterioration.  Critical care was time spent personally by me (independent of midlevel providers or residents) on the following activities: development of treatment plan with patient and/or surrogate as well as nursing,  discussions with consultants, evaluation of patient's response to treatment, examination of patient, obtaining history from patient or surrogate, ordering and performing treatments and interventions, ordering and review of laboratory studies, ordering and review of radiographic studies, pulse oximetry and re-evaluation of patient's condition.  Toribio Fuel, MD  12:44 AM

## 2023-07-15 NOTE — Plan of Care (Signed)
 Laymon Fritz, RN

## 2023-07-16 ENCOUNTER — Inpatient Hospital Stay (HOSPITAL_COMMUNITY)

## 2023-07-16 DIAGNOSIS — I5023 Acute on chronic systolic (congestive) heart failure: Secondary | ICD-10-CM | POA: Diagnosis not present

## 2023-07-16 DIAGNOSIS — I493 Ventricular premature depolarization: Secondary | ICD-10-CM | POA: Diagnosis not present

## 2023-07-16 DIAGNOSIS — I4891 Unspecified atrial fibrillation: Secondary | ICD-10-CM | POA: Diagnosis not present

## 2023-07-16 LAB — CBC
HCT: 41.6 % (ref 39.0–52.0)
Hemoglobin: 14.1 g/dL (ref 13.0–17.0)
MCH: 34.9 pg — ABNORMAL HIGH (ref 26.0–34.0)
MCHC: 33.9 g/dL (ref 30.0–36.0)
MCV: 103 fL — ABNORMAL HIGH (ref 80.0–100.0)
Platelets: 206 K/uL (ref 150–400)
RBC: 4.04 MIL/uL — ABNORMAL LOW (ref 4.22–5.81)
RDW: 13.9 % (ref 11.5–15.5)
WBC: 12.6 K/uL — ABNORMAL HIGH (ref 4.0–10.5)
nRBC: 0.2 % (ref 0.0–0.2)

## 2023-07-16 LAB — COOXEMETRY PANEL
Carboxyhemoglobin: 3 % — ABNORMAL HIGH (ref 0.5–1.5)
Methemoglobin: 1 % (ref 0.0–1.5)
O2 Saturation: 68.2 %
Total hemoglobin: 14.3 g/dL (ref 12.0–16.0)

## 2023-07-16 LAB — COMPREHENSIVE METABOLIC PANEL WITH GFR
ALT: 1276 U/L — ABNORMAL HIGH (ref 0–44)
AST: 912 U/L — ABNORMAL HIGH (ref 15–41)
Albumin: 3.7 g/dL (ref 3.5–5.0)
Alkaline Phosphatase: 98 U/L (ref 38–126)
Anion gap: 14 (ref 5–15)
BUN: 21 mg/dL (ref 8–23)
CO2: 31 mmol/L (ref 22–32)
Calcium: 9 mg/dL (ref 8.9–10.3)
Chloride: 89 mmol/L — ABNORMAL LOW (ref 98–111)
Creatinine, Ser: 1.12 mg/dL (ref 0.61–1.24)
GFR, Estimated: >60 mL/min (ref >60)
Glucose, Bld: 140 mg/dL — ABNORMAL HIGH (ref 70–99)
Potassium: 4.1 mmol/L (ref 3.5–5.1)
Sodium: 134 mmol/L — ABNORMAL LOW (ref 135–145)
Total Bilirubin: 1.6 mg/dL — ABNORMAL HIGH (ref 0.0–1.2)
Total Protein: 7 g/dL (ref 6.5–8.1)

## 2023-07-16 LAB — HEPARIN LEVEL (UNFRACTIONATED): Heparin Unfractionated: 0.57 [IU]/mL (ref 0.30–0.70)

## 2023-07-16 LAB — MAGNESIUM: Magnesium: 2.1 mg/dL (ref 1.7–2.4)

## 2023-07-16 MED ORDER — POTASSIUM CHLORIDE CRYS ER 20 MEQ PO TBCR
40.0000 meq | EXTENDED_RELEASE_TABLET | Freq: Once | ORAL | Status: AC
  Administered 2023-07-16: 40 meq via ORAL
  Filled 2023-07-16: qty 2

## 2023-07-16 MED ORDER — SENNOSIDES-DOCUSATE SODIUM 8.6-50 MG PO TABS
2.0000 | ORAL_TABLET | Freq: Every day | ORAL | Status: DC
  Filled 2023-07-16: qty 2

## 2023-07-16 MED ORDER — APIXABAN 5 MG PO TABS
5.0000 mg | ORAL_TABLET | Freq: Two times a day (BID) | ORAL | Status: DC
  Administered 2023-07-16 – 2023-07-18 (×4): 5 mg via ORAL
  Filled 2023-07-16 (×4): qty 1

## 2023-07-16 MED ORDER — DRONABINOL 2.5 MG PO CAPS
2.5000 mg | ORAL_CAPSULE | Freq: Every day | ORAL | Status: DC
  Administered 2023-07-16 – 2023-07-18 (×3): 2.5 mg via ORAL
  Filled 2023-07-16 (×3): qty 1

## 2023-07-16 MED ORDER — POLYETHYLENE GLYCOL 3350 17 G PO PACK
17.0000 g | PACK | Freq: Two times a day (BID) | ORAL | Status: DC
  Filled 2023-07-16 (×2): qty 1

## 2023-07-16 NOTE — Progress Notes (Addendum)
 ANTICOAGULATION CONSULT NOTE  Pharmacy Consult for heparin  Indication: atrial fibrillation  No Known Allergies  Patient Measurements: Height: 5' 11 (180.3 cm) Weight: 88 kg (194 lb 0.1 oz) IBW/kg (Calculated) : 75.3 Heparin  Dosing Weight: 93 kg   Vital Signs: Temp: 96.4 F (35.8 C) (07/11 0700) Temp Source: Core (07/11 0400) BP: 111/79 (07/11 0700) Pulse Rate: 76 (07/11 0700)  Labs: Recent Labs    07/14/23 0229 07/14/23 0359 07/14/23 1143 07/14/23 2342 07/15/23 0514 07/16/23 0359  HGB 13.3  --   --   --  13.1 14.1  HCT 39.7  --   --   --  38.4* 41.6  PLT 256  --   --   --  205 206  HEPARINUNFRC 0.17*   < > 0.35  --  0.48 0.57  CREATININE  --    < >  --  1.25* 1.17 1.12   < > = values in this interval not displayed.    Estimated Creatinine Clearance: 57.9 mL/min (by C-G formula based on SCr of 1.12 mg/dL).  Medical History: 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   Alcoholic polyneuropathy (HCC)    Allergy    Asthma    BPH (benign prostatic hyperplasia)    Cardiogenic shock (HCC) 06/29/2023   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 failure) (HCC) 04/23/2023   COPD (chronic obstructive pulmonary disease) (HCC)    Depression    Depression    Diverticulosis    Dyspnea on exertion    Elevated LDL cholesterol level    GERD with stricture    Gout    Hiatal hernia    Hypertension    Hypotension 06/29/2023   Hypotension after procedure 06/29/2023   IPF (idiopathic pulmonary fibrosis) (HCC) 04/23/2023   Kidney stones    2 times - passed stones, no surgery required   Lumbar and sacral osteoarthritis    Mitral regurgitation 04/23/2023   OSA on CPAP 04/23/2023   Peripheral polyneuropathy    Shock (HCC) 07/01/2023   Sleep apnea    wears CPAP   Wears hearing aid    Bil    Medications:  See MAR  Assessment: 78 yo male presents with new onset atrial fibrillation.  Not on anticoagulation prior to  admission.  Pharmacy consulted for heparin  dosing.  Heparin  level 0.57 today is therapeutic on 1600 units/hr.  CBC stable, no issues.    Goal of Therapy:  Heparin  level 0.3-0.7 units/ml Monitor platelets by anticoagulation protocol: Yes   Plan:  Continue heparin  infusion 1600 units/hr Daily CBC, anti-Xa level Monitor for s/sx of bleeding F/u to transition to PO  ADDENDUM - ok to switch to DOAC.  Self converted 7/9 PM to NSR.  Stop heparin  IV, start Eliquis  5 mg BID for AF.  Maurilio Fila, PharmD Clinical Pharmacist 07/16/2023  7:12 AM

## 2023-07-16 NOTE — Progress Notes (Signed)
 Inpatient Rehab Admissions:  Inpatient Rehab Consult received.  I met with patient and his wife Jose Hines at the bedside for rehabilitation assessment and to discuss goals and expectations of an inpatient rehab admission.  Discussed average length of stay, insurance authorization requirement and discharge home after completion of CIR. Wife acknowledged understanding. Pt/wife would like to pursue CIR. Jose Hines confirmed that she will be able to provide 24/7 support after discharge. Will continue to follow.  Signed: Tinnie Yvone Cohens, MS, CCC-SLP Admissions Coordinator [phone]

## 2023-07-16 NOTE — Progress Notes (Addendum)
 Advanced Heart Failure Rounding Note  Cardiologist: Gordy Bergamo, MD  AHF: Dr. Gardenia  Chief Complaint: a/c CHF + Afib w/ RVR   Patient Profile   78 y/o male w/ chronic systolic heart failure EF 20-25%, mod-severe MR and CAD s/p recent PCI to LAD c/b coronary perforation and tamponade requiring urgent pericardiocentesis, now readmitted for a/c CHF w/ low output in the setting of new Afib w/ RVR.    Subjective:    7/9 Transferred to the ICU for Cardiogenic shock. Swan placed at bedside.   CO-OX 68% with Fick CI of 2.1 and TD of CI 2 on 0.375 milrinone . PA waveform flat on Swan.  CVP 9. 5.8L UOP last 24 hrs with lasix  gtt at 20/hr + 5 mg metolazone .   Sitting up in chair. Worked with PT this am. Very little appetite.   Objective:   Weight Range: 88 kg Body mass index is 27.06 kg/m.   Vital Signs:   Temp:  [95.9 F (35.5 C)-97.7 F (36.5 C)] 96.8 F (36 C) (07/11 0900) Pulse Rate:  [74-89] 80 (07/11 0907) Resp:  [11-33] 19 (07/11 0900) BP: (95-122)/(60-84) 103/70 (07/11 0900) SpO2:  [89 %-100 %] 95 % (07/11 0900) FiO2 (%):  [21 %] 21 % (07/10 2046) Weight:  [88 kg] 88 kg (07/11 0500) Last BM Date :  (PTA)  Weight change: Filed Weights   07/14/23 1156 07/15/23 0500 07/16/23 0500  Weight: 99.2 kg 79 kg 88 kg    Intake/Output:   Intake/Output Summary (Last 24 hours) at 07/16/2023 1047 Last data filed at 07/16/2023 0800 Gross per 24 hour  Intake 2178.6 ml  Output 5925 ml  Net -3746.4 ml      Physical Exam    General:  Chronically ill appearing elderly male ENT: Very HOH Neck: JVP 8-10, R internal jugular Swan Cor: Regular rate & rhythm. No rubs, gallops or murmurs. Lungs: clear Abdomen: soft, nontender, nondistended.  Extremities: no edema, legs are warm Neuro: alert & orientedx3. Affect flat.   Telemetry   SR 70s-80s, 5-10 PVCs/min  EKG    N/A   Labs    CBC Recent Labs    07/15/23 0514 07/16/23 0359  WBC 12.3* 12.6*  HGB 13.1 14.1   HCT 38.4* 41.6  MCV 106.4* 103.0*  PLT 205 206   Basic Metabolic Panel Recent Labs    92/89/74 0514 07/15/23 2221 07/16/23 0359  NA 134*  --  134*  K 3.5 3.2* 4.1  CL 96*  --  89*  CO2 24  --  31  GLUCOSE 136*  --  140*  BUN 30*  --  21  CREATININE 1.17  --  1.12  CALCIUM  8.2*  --  9.0  MG 2.2  --  2.1   Liver Function Tests Recent Labs    07/15/23 0514 07/16/23 0359  AST 1,480* 912*  ALT 1,270* 1,276*  ALKPHOS 80 98  BILITOT 1.4* 1.6*  PROT 6.2* 7.0  ALBUMIN 3.2* 3.7   No results for input(s): LIPASE, AMYLASE in the last 72 hours.  Cardiac Enzymes No results for input(s): CKTOTAL, CKMB, CKMBINDEX, TROPONINI in the last 72 hours.  BNP: BNP (last 3 results) Recent Labs    06/14/23 1200 06/29/23 1703 07/13/23 1130  BNP 657.2* 575.2* 1,339.7*    ProBNP (last 3 results) Recent Labs    11/13/22 1539  PROBNP 400     D-Dimer No results for input(s): DDIMER in the last 72 hours. Hemoglobin A1C No results for  input(s): HGBA1C in the last 72 hours. Fasting Lipid Panel No results for input(s): CHOL, HDL, LDLCALC, TRIG, CHOLHDL, LDLDIRECT in the last 72 hours. Thyroid  Function Tests No results for input(s): TSH, T4TOTAL, T3FREE, THYROIDAB in the last 72 hours.  Invalid input(s): FREET3  Other results:   Imaging    No results found.    Medications:     Scheduled Medications:  aspirin  EC  81 mg Oral Daily   budesonide   0.5 mg Nebulization BID   buPROPion   150 mg Oral q morning   Chlorhexidine  Gluconate Cloth  6 each Topical Daily   clopidogrel   75 mg Oral Daily   digoxin   0.125 mg Oral Daily   ezetimibe   10 mg Oral Daily   mexiletine  150 mg Oral Q8H   pantoprazole   40 mg Oral QAC breakfast   Pirfenidone   801 mg Oral TID WC   sodium chloride  flush  10-40 mL Intracatheter Q12H   sodium chloride  flush  3 mL Intravenous Q12H    Infusions:  amiodarone  60 mg/hr (07/16/23 0800)   furosemide  (LASIX ) 200  mg in dextrose  5 % 100 mL (2 mg/mL) infusion 20 mg/hr (07/16/23 0800)   heparin  1,600 Units/hr (07/16/23 0800)   milrinone  0.375 mcg/kg/min (07/16/23 0941)    PRN Medications: acetaminophen  **OR** acetaminophen , albuterol , ondansetron  (ZOFRAN ) IV, mouth rinse, polyethylene glycol, sodium chloride  flush    Assessment/Plan   1. Acute on chronic systolic CHF w/ Low Output:  - Ischemic cardiomyopathy.   - Echo in 6/25 with EF 25-30%, mild RV dysfunction, moderate MR.   - Readmitted with a/c CHF with low-output in setting of Afib with RVR. Lactic acid peaked at 3.1, now cleared. - Echo w/ EF now < 20%, RV mildly reduced, moderate MR - CI improved with 0.375 milrinone . Will continue today.  - PA waveform on Swan is flat. Chest X-ray to check Swan placement. - CVP 9. Continue lasix  gtt at 20/hr. Will hold off on additional metolazone .  2. Atrial fibrillation: New diagnosis. - Continue amiodarone  gtt while on inotrope support, will decrease rate to 30/hr. - Converted to SR 07/09. Maintaining SR today. - Heparin  gtt >> will need to switch to eliquis  at some point.   3. CAD: Has CTO of RCA.  In 6/25, had complicated PCI to severe proximal LAD stenosis with orbital atherectomy and DES.  This was complicated by coronary perforation and tamponade.  - He is on Plavix  and ASA though he had not taken for several days due to nausea.   - Will stop ASA after 1 month post-PCI given need for anticoagulation. Will need to continue Plavix .  - Holding statin with elevated LFTs, plan to restart prior to discharge  4. H/o pericardial tamponade: Due to coronary perforation in 6/25 associated with LAD PCI. S/p pericardiocentesis.   5. Mitral regurgitation: Moderate-severe by TEE in 5/25 and being considered for mTEER, but noted as no more than moderate on recent echoes.   - Echo this admission w/ moderate MR  6. 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  - Improving - continue to support  cardiac output with milrinone  and diurese - Holding statin  7. 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  - mexiletine discontinued on admit now that he is on amiodarone . Added back on 07/09 d/t frequent PVCs. Burden improved.   8. Idiopathic pulmonary fibrosis: Continue pirfenidone . - currently on IV amio for rate/rhythm control, long term use not ideal   9. ID -WBCs up to 18K>> 12K. No  fever. -Some suspicion for consolidation R base on CXR. PCT negative and BC X 2 NG. - Had been covered empricially with ceftriaxone  and doxy. Stopped Abx.   Continue aggressive PT/OT.  Will add marinol  to help stimulate appetite. Although, remained concerned that decreased appetite is due to low output HF.  Length of Stay: 3  FINCH, LINDSAY N, PA-C  07/16/2023, 10:47 AM  Advanced Heart Failure Team Pager [phone] (M-F; 7a - 5p)  Please contact [facility] for night-coverage after hours (5p -7a ) and weekends on [email or web address]   Agree with above.   Remains on milrinone  0.375. Cardiac output still marginal. Swan numbers done personally.   Feels weak. Unable to ambulate.   Back in sinus rhythm. On IV amio and heparin .   General:  Weak appearing. No resp difficulty HEENT: normal Neck: supple. JVP to jaw Carotids 2+ bilat; no bruits. No lymphadenopathy or thryomegaly appreciated. Cor: Regular rate & rhythm. + s3 Lungs: clear Abdomen: soft, nontender, nondistended. No hepatosplenomegaly. No bruits or masses. Good bowel sounds. Extremities: no cyanosis, clubbing, rash, edema Neuro: alert & orientedx3, cranial nerves grossly intact. moves all 4 extremities w/o difficulty. Affect pleasant  Swan numbers consistent with low output HF with R>L HF. I am worried he is end-stage. EF is 25-30% at baseline and likely further reduced by AF. Unclear how much better he will get by maintaining NSR. Daughter report his functional capacity and QoL has been poor for almost a year. Will continue aggressive care for  now but will consult Palliative Care to establish GOC. His daughter is extremely supportive and agrees.  CRITICAL CARE Performed by: Cherrie Sieving  Total critical care time: 45 minutes  Critical care time was exclusive of separately billable procedures and treating other patients.  Critical care was necessary to treat or prevent imminent or life-threatening deterioration.  Critical care was time spent personally by me (independent of midlevel providers or residents) on the following activities: development of treatment plan with patient and/or surrogate as well as nursing, discussions with consultants, evaluation of patient's response to treatment, examination of patient, obtaining history from patient or surrogate, ordering and performing treatments and interventions, ordering and review of laboratory studies, ordering and review of radiographic studies, pulse oximetry and re-evaluation of patient's condition.  Sieving Cherrie, MD  10:20 PM

## 2023-07-16 NOTE — TOC Initial Note (Addendum)
 Transition of Care (TOC) - Initial/Assessment Note    Patient Details  Name: Jose Hines. MRN: [id number] Date of Birth: 1945/12/08  Transition of Care [facility]) CM/SW Contact:    Justina Delcia Czar, RN Phone Number: [phone] 07/16/2023, 9:10 AM  Clinical Narrative:                 Readmission Spoke to pt and wife at bedside. Wife states [facility] did not come after dc. Contacted rep, Lynette while in room. States she will review referral. Wife agreeable to keeping agency if they can give be consistent with coming out for [facility]. Plan is pt will go to CIR for IP rehab and may need HH after rehab. Updated [facility] rep, Arna. States to provide her contact. Agency will continue to follow for [facility].  Patient recently hospitalization discharged with Home Health with [facility].  Lives at home with spouse.  Has shower chair and scale at home.   Will continue to follow for dc needs.   Expected Discharge Plan: Home w Home Health Services Barriers to Discharge: Continued Medical Work up   Patient Goals and CMS Choice            Expected Discharge Plan and Services   Discharge Planning Services: CM Consult Post Acute Care Choice: Home Health Living arrangements for the past 2 months: Single Family Home                                      Prior Living Arrangements/Services Living arrangements for the past 2 months: Single Family Home Lives with:: Spouse   Do you feel safe going back to the place where you live?: Yes      Need for Family Participation in Patient Care: Yes (Comment) Care giver support system in place?: Yes (comment)      Activities of Daily Living      Permission Sought/Granted Permission sought to share information with : Case Manager, Family Supports, PCP Permission granted to share information with : Yes, Verbal Permission Granted  Share Information with NAME: Jose Hines  Permission granted to share info w AGENCY: Home Health, DME,  PCP  Permission granted to share info w Relationship: wife  Permission granted to share info w Contact Information: [phone]  Emotional Assessment              Admission diagnosis:  Transaminitis [R74.01] Atrial fibrillation with RVR (HCC) [I48.91] Nausea vomiting and diarrhea [R11.2, R19.7] Acute on chronic combined systolic and diastolic CHF (congestive heart failure) (HCC) [I50.43] Patient Active Problem List   Diagnosis Date Noted   Acute on chronic combined systolic and diastolic CHF (congestive heart failure) (HCC) 07/13/2023   Atrial fibrillation with RVR (HCC) 07/13/2023   CAD (coronary artery disease) 07/04/2023   COPD with acute exacerbation (HCC) 04/23/2023   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 failure) (HCC) 04/23/2023   Mitral regurgitation 04/23/2023   OSA on CPAP 04/23/2023   IPF (idiopathic pulmonary fibrosis) (HCC) 04/23/2023   Acute respiratory failure with hypoxia (HCC) 04/23/2023   Dizziness 01/16/2021   Essential hypertension 01/16/2021   GERD 01/09/2009   History of colonic polyps 01/09/2009   PCP:  Seabron Lenis, MD Pharmacy:   [facility] [id number] [place], [place] - [address] [place]  [address] Phone: [phone] Fax: [phone]     Social Drivers of  Health (SDOH) Social History: SDOH Screenings   Food Insecurity: No Food Insecurity (07/15/2023)  Housing: Low Risk  (07/15/2023)  Transportation Needs: No Transportation Needs (07/15/2023)  Utilities: Not At Risk (07/15/2023)  Social Connections: Moderately Isolated (07/15/2023)  Tobacco Use: Medium Risk (07/13/2023)   SDOH Interventions:     Readmission Risk Interventions     No data to display

## 2023-07-16 NOTE — PMR Pre-admission (Incomplete)
 PMR Admission Coordinator Pre-Admission Assessment  Patient: Jose Hines. is an 78 y.o., male MRN: [id number] DOB: September 04, 1945 Height: 5' 11 (180.3 cm) Weight: 88 kg  Insurance Information HMO:     PPO: yes     PCP:      IPA:      80/20:      OTHER:  PRIMARY: [employer] Medicare Choice      Policy#: [id number]      Subscriber: patient CM Name: ***      Phone#: ***     Fax#: *** Pre-Cert#: ***      Employer: *** Benefits:  Phone #: ***     Name: *** Eustacio. Date: ***     Deduct: ***      Out of Pocket Max: ***      Life Max: *** CIR: ***      SNF: *** Outpatient: ***     Co-Pay: *** Home Health: ***      Co-Pay: *** DME: ***     Co-Pay: *** Providers: in-network SECONDARY:       Policy#:      Phone#:   Financial Counselor:       Phone#:   The Data processing manager" for patients in Inpatient Rehabilitation Facilities with attached "Privacy Act Statement-Health Care Records" was provided and verbally reviewed with: {CHL IP Patient Family WJ:[id number]}  Emergency Contact Information Contact Information     Name Relation Home Work Mobile   Dominick, Morella [phone]      Other Contacts   None on File     Current Medical History  Patient Admitting Diagnosis: *** History of Present Illness: Pt is a 79 year old male with medical hx significant for: ischemic cardiomyopathy, CAD, pulmonary fibrosis, advanced heart failure with EF 20-25%. Pt admitted on 06/30/23 for PCI of LAD. Pt became hypotensive and developed hemorrhagic pericardial effusion. Pt underwent emergent pericardiocentesis and ultimately had pericardial drain placed which has subsequently been removed.  Pt in A-fib with RVR at follow up cardiology appointment on 7/8. Pt reported nausea, vomiting and diarrhea 3 days prior. Cardiology sent pt to ER. Pt presented to [facility] on 07/13/23. CT abdomen/pelvis negative for acute abnormality. Chest x-ray showed small right pleural  effusion, trace left pleural effusion and interstitial prominence consistent with vascular congestion. Cardiology consulted. Pt converted to sinus rhythm on 7/9.*** Therapy evaluations completed and CIR recommended d/t pt's deficits in functional mobility.     Patient's medical record from [facility] has been reviewed by the rehabilitation admission coordinator and physician.  Past Medical History  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   Alcoholic polyneuropathy (HCC)    Allergy    Asthma    BPH (benign prostatic hyperplasia)    Cardiogenic shock (HCC) 06/29/2023   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 failure) (HCC) 04/23/2023   COPD (chronic obstructive pulmonary disease) (HCC)    Depression    Depression    Diverticulosis    Dyspnea on exertion    Elevated LDL cholesterol level    GERD with stricture    Gout    Hiatal hernia    Hypertension    Hypotension 06/29/2023   Hypotension after procedure 06/29/2023   IPF (idiopathic pulmonary fibrosis) (HCC) 04/23/2023   Kidney stones    2 times - passed stones, no surgery required   Lumbar and sacral osteoarthritis    Mitral regurgitation 04/23/2023  OSA on CPAP 04/23/2023   Peripheral polyneuropathy    Shock (HCC) 07/01/2023   Sleep apnea    wears CPAP   Wears hearing aid    Bil    Has the patient had major surgery during 100 days prior to admission? No  Family History   family history includes Heart disease (age of onset: 38) in his father; Other in his son; Transient ischemic attack in his mother.  Current Medications  Current Facility-Administered Medications:    acetaminophen  (TYLENOL ) tablet 650 mg, 650 mg, Oral, Q6H PRN **OR** acetaminophen  (TYLENOL ) suppository 650 mg, 650 mg, Rectal, Q6H PRN, Melvin, Alexander B, MD   albuterol  (PROVENTIL ) (2.5 MG/3ML) 0.083% nebulizer solution 2.5 mg, 2.5 mg, Nebulization, Q6H PRN, Melvin, Alexander B, MD    [COMPLETED] amiodarone  (NEXTERONE ) 1.8 mg/mL load via infusion 150 mg, 150 mg, Intravenous, Once, 150 mg at 07/13/23 1150 **FOLLOWED BY** [EXPIRED] amiodarone  (NEXTERONE  PREMIX) 360-4.14 MG/200ML-% (1.8 mg/mL) IV infusion, 60 mg/hr, Intravenous, Continuous, Stopped at 07/13/23 1707 **FOLLOWED BY** amiodarone  (NEXTERONE  PREMIX) 360-4.14 MG/200ML-% (1.8 mg/mL) IV infusion, 30 mg/hr, Intravenous, Continuous, Bensimhon, Toribio SAUNDERS, MD, Last Rate: 16.67 mL/hr at 07/16/23 1300, 30 mg/hr at 07/16/23 1300   aspirin  EC tablet 81 mg, 81 mg, Oral, Daily, Colletta Manuelita Garre, PA-C, 81 mg at 07/16/23 9092   budesonide  (PULMICORT ) nebulizer solution 0.5 mg, 0.5 mg, Nebulization, BID, Melvin, Alexander B, MD, 0.5 mg at 07/16/23 9245   buPROPion  (WELLBUTRIN  XL) 24 hr tablet 150 mg, 150 mg, Oral, q morning, Melvin, Alexander B, MD, 150 mg at 07/16/23 9091   Chlorhexidine  Gluconate Cloth 2 % PADS 6 each, 6 each, Topical, Daily, Melvin, Alexander B, MD, 6 each at 07/16/23 0930   clopidogrel  (PLAVIX ) tablet 75 mg, 75 mg, Oral, Daily, Melvin, Alexander B, MD, 75 mg at 07/16/23 0908   digoxin  (LANOXIN ) tablet 0.125 mg, 0.125 mg, Oral, Daily, Colletta Manuelita Garre, PA-C, 0.125 mg at 07/16/23 9092   dronabinol  (MARINOL ) capsule 2.5 mg, 2.5 mg, Oral, QAC lunch, Colletta Manuelita Garre, PA-C, 2.5 mg at 07/16/23 1153   ezetimibe  (ZETIA ) tablet 10 mg, 10 mg, Oral, Daily, Melvin, Alexander B, MD, 10 mg at 07/16/23 0908   furosemide  (LASIX ) 200 mg in dextrose  5 % 100 mL (2 mg/mL) infusion, 20 mg/hr, Intravenous, Continuous, Colletta Manuelita Garre, PA-C, Last Rate: 10 mL/hr at 07/16/23 1300, 20 mg/hr at 07/16/23 1300   heparin  ADULT infusion 100 units/mL (25000 units/250mL), 1,600 Units/hr, Intravenous, Continuous, Bryk, Veronda P, RPH, Last Rate: 16 mL/hr at 07/16/23 1300, 1,600 Units/hr at 07/16/23 1300   mexiletine (MEXITIL ) capsule 150 mg, 150 mg, Oral, Q8H, Bensimhon, Daniel R, MD, 150 mg at 07/16/23 1334   milrinone  (PRIMACOR ) 20  MG/100 ML (0.2 mg/mL) infusion, 0.375 mcg/kg/min, Intravenous, Continuous, Colletta Manuelita Garre, PA-C, Last Rate: 10.44 mL/hr at 07/16/23 1300, 0.375 mcg/kg/min at 07/16/23 1300   ondansetron  (ZOFRAN ) injection 4 mg, 4 mg, Intravenous, Q8H PRN, Melvin, Alexander B, MD, 4 mg at 07/15/23 8177   Oral care mouth rinse, 15 mL, Mouth Rinse, PRN, Bensimhon, Daniel R, MD   pantoprazole  (PROTONIX ) EC tablet 40 mg, 40 mg, Oral, QAC breakfast, Melvin, Alexander B, MD, 40 mg at 07/16/23 9092   Pirfenidone  TABS 801 mg, 801 mg, Oral, TID WC, Melvin, Alexander B, MD, 801 mg at 07/16/23 1235   polyethylene glycol (MIRALAX  / GLYCOLAX ) packet 17 g, 17 g, Oral, Daily PRN, Melvin, Alexander B, MD   sodium chloride  flush (NS) 0.9 % injection 10-40 mL, 10-40 mL, Intracatheter, Q12H, Melvin,  Marsa NOVAK, MD, 10 mL at 07/16/23 0930   sodium chloride  flush (NS) 0.9 % injection 10-40 mL, 10-40 mL, Intracatheter, PRN, Melvin, Alexander B, MD   sodium chloride  flush (NS) 0.9 % injection 3 mL, 3 mL, Intravenous, Q12H, Melvin, Alexander B, MD, 3 mL at 07/16/23 0930  Patients Current Diet:  Diet Order             Diet Heart Room service appropriate? Yes; Fluid consistency: Thin; Fluid restriction: 1500 mL Fluid  Diet effective now                   Precautions / Restrictions Precautions Precautions: Fall Restrictions Weight Bearing Restrictions Per Provider Order: No   Has the patient had 2 or more falls or a fall with injury in the past year? No  Prior Activity Level Limited Community (1-2x/wk): MD appointments  Prior Functional Level Self Care: Did the patient need help bathing, dressing, using the toilet or eating? Independent  Indoor Mobility: Did the patient need assistance with walking from room to room (with or without device)? Independent  Stairs: Did the patient need assistance with internal or external stairs (with or without device)? Unknown (avoids steps)  Functional Cognition: Did the patient  need help planning regular tasks such as shopping or remembering to take medications? Needed some help  Patient Information Are you of Hispanic, Latino/a,or Spanish origin?: A. No, not of Hispanic, Latino/a, or Spanish origin What is your race?: A. White Do you need or want an interpreter to communicate with a doctor or health care staff?: 0. No  Patient's Response To:  Health Literacy and Transportation Is the patient able to respond to health literacy and transportation needs?: Yes Health Literacy - How often do you need to have someone help you when you read instructions, pamphlets, or other written material from your doctor or pharmacy?: Never In the past 12 months, has lack of transportation kept you from medical appointments or from getting medications?: No In the past 12 months, has lack of transportation kept you from meetings, work, or from getting things needed for daily living?: No  Journalist, newspaper / Equipment Home Equipment: Occupational hygienist (4 wheels), Cane - quad, Shower seat  Prior Device Use: Indicate devices/aids used by the patient prior to current illness, exacerbation or injury? Walker and cane  Current Functional Level Cognition  Orientation Level: Oriented X4    Extremity Assessment (includes Sensation/Coordination)  Upper Extremity Assessment: Defer to OT evaluation  Lower Extremity Assessment: Generalized weakness    ADLs       Mobility  Overal bed mobility: Needs Assistance Bed Mobility: Supine to Sit Supine to sit: Min assist General bed mobility comments: Min assist for LE and trunk to rise to EOB with cues, extra time.    Transfers  Overall transfer level: Needs assistance Equipment used: Rolling walker (2 wheels) Transfers: Sit to/from Stand, Bed to chair/wheelchair/BSC Sit to Stand: Mod assist Bed to/from chair/wheelchair/BSC transfer type:: Step pivot Step pivot transfers: Min assist General transfer comment: Mod assist for boost to stand from  edge of bed. Cues for hand placement and weight shift prior to rising. RW for support, min assist for balance and RW control with step pivot transfer to recliner, fair control with descent. Fatigued quickly.    Ambulation / Gait / Stairs / Engineer, drilling / Balance Balance Overall balance assessment: Needs assistance Sitting-balance support: No upper extremity supported, Feet supported Sitting balance-Leahy Scale: Good  Standing balance support: Bilateral upper extremity supported, Reliant on assistive device for balance, During functional activity Standing balance-Leahy Scale: Poor Standing balance comment: Benefits from RW    Special needs/care consideration Continuous Drip IV  *** and Skin Abrasion: buttocks/upper; Ecchymosis: arm/left; Erythema/Redness: sacrum/upper   Previous Home Environment (from acute therapy documentation) Living Arrangements: Spouse/significant other  Lives With: Spouse Available Help at Discharge: Family, Available 24 hours/day Type of Home: House Home Layout: One level Home Access: Stairs to enter Entrance Stairs-Rails: Right, Left Entrance Stairs-Number of Steps: 1 front, 6 back Bathroom Shower/Tub: Engineer, manufacturing systems: Standard Bathroom Accessibility: Yes How Accessible: Accessible via walker Home Care Services: No  Discharge Living Setting Plans for Discharge Living Setting: Patient's home Type of Home at Discharge: House Discharge Home Layout: One level Discharge Home Access: Stairs to enter Entrance Stairs-Rails: None Entrance Stairs-Number of Steps: 1 Discharge Bathroom Shower/Tub: Tub/shower unit Discharge Bathroom Toilet: Standard Discharge Bathroom Accessibility: Yes How Accessible: Accessible via walker Does the patient have any problems obtaining your medications?: No  Social/Family/Support Systems Anticipated Caregiver: Dyami Umbach, wife Anticipated Caregiver's Contact Information:  [phone] Caregiver Availability: 24/7 Discharge Plan Discussed with Primary Caregiver: Yes Is Caregiver In Agreement with Plan?: Yes Does Caregiver/Family have Issues with Lodging/Transportation while Pt is in Rehab?: No  Goals Patient/Family Goal for Rehab: *** Expected length of stay: *** Pt/Family Agrees to Admission and willing to participate: Yes Program Orientation Provided & Reviewed with Pt/Caregiver Including Roles  & Responsibilities: Yes  Decrease burden of Care through IP rehab admission: NA  Possible need for SNF placement upon discharge: Not anticipated  Patient Condition: {PATIENT'S CONDITION:22832}  Preadmission Screen Completed By:  Tinnie SHAUNNA Yvone Delayne, 07/16/2023 3:02 PM ______________________________________________________________________   Discussed status with Dr. PIERRETTE on *** at *** and received approval for admission today.  Admission Coordinator:  Tinnie SHAUNNA Yvone Delayne, CCC-SLP, time ***/Date ***   Assessment/Plan: Diagnosis: *** Does the need for close, 24 hr/day Medical supervision in concert with the patient's rehab needs make it unreasonable for this patient to be served in a less intensive setting? {yes_no_potentially:3041433} Co-Morbidities requiring supervision/potential complications: *** Due to {due un:6958565}, does the patient require 24 hr/day rehab nursing? {yes_no_potentially:3041433} Does the patient require coordinated care of a physician, rehab nurse, PT, OT, and SLP to address physical and functional deficits in the context of the above medical diagnosis(es)? {yes_no_potentially:3041433} Addressing deficits in the following areas: {deficits:3041436} Can the patient actively participate in an intensive therapy program of at least 3 hrs of therapy 5 days a week? {yes_no_potentially:3041433} The potential for patient to make measurable gains while on inpatient rehab is {potential:3041437} Anticipated functional outcomes upon discharge from  inpatient rehab: {functional outcomes:304600100} PT, {functional outcomes:304600100} OT, {functional outcomes:304600100} SLP Estimated rehab length of stay to reach the above functional goals is: *** Anticipated discharge destination: {anticipated dc setting:21604} 10. Overall Rehab/Functional Prognosis: {potential:3041437}   MD Signature: ***

## 2023-07-16 NOTE — Progress Notes (Signed)
  Inpatient Rehab Admissions Coordinator :  Per therapy recommendations, patient was screened for CIR candidacy by Heron Leavell RN MSN.  At this time patient appears to be a potential candidate for CIR. I will place a rehab consult per protocol for full assessment. I will not be his assigned Admissions Coordinator. Please call me with any questions.  Heron Leavell RN MSN Admissions Coordinator [phone]

## 2023-07-16 NOTE — Progress Notes (Signed)
   07/16/23 2117  BiPAP/CPAP/SIPAP  BiPAP/CPAP/SIPAP Pt Type Adult  BiPAP/CPAP/SIPAP Resmed  Mask Type  (nasal pillows)  Dentures removed? Not applicable  Respiratory Rate 16 breaths/min  EPAP 10 cmH2O  FiO2 (%) 21 %  Patient Home Machine Yes  Safety Check Completed by RT for Home Unit Yes, no issues noted  Patient Home Mask Yes  Patient Home Tubing Yes  Auto Titrate No  CPAP/SIPAP surface wiped down Yes  Device Plugged into RED Power Outlet Yes  BiPAP/CPAP /SiPAP Vitals  Temp (!) 96.8 F (36 C)  Pulse Rate 85  Resp 18  SpO2 95 %  Bilateral Breath Sounds Clear  MEWS Score/Color  MEWS Score 1  MEWS Score Color Green

## 2023-07-16 NOTE — Evaluation (Signed)
 Physical Therapy Evaluation Patient Details Name: Jose Hines. MRN: [id number] DOB: 06/21/1945 Today's Date: 07/16/2023  History of Present Illness  78 year old male admitted 7/8 for a-fib, Acute on chronic systolic CHF w/ Low Output.  Recent admission 6/24 for LAD stent, complicated by post-op pericardial effusion, Tamponade, and cardiogenic shock. PMH: chronic HFrEF, ICM, CAD  CTO RCA/high grade proxima LAD, pulmonary fibrosis, COPD, and severe MR  Clinical Impression  Pt admitted with above diagnosis. Recently d/c and familiar to our services from prior admission. Wife reports HHPT service never followed-up after d/c. States pt has gotten progressively weak. Pt was close to mod I with all tasks but now requires up to mod assist for sit to stand transfer today, min assist to step pivot with RW. Fatigues rapidly, only standing about 1 minute. Reviewed LE exercises. Patient will benefit from intensive inpatient follow-up therapy, >3 hours/day. Will update recs as pt progresses. Pt currently with functional limitations due to the deficits listed below (see PT Problem List). Pt will benefit from acute skilled PT to increase their independence and safety with mobility to allow discharge.           If plan is discharge home, recommend the following: A little help with walking and/or transfers;A little help with bathing/dressing/bathroom;Assistance with cooking/housework;Assist for transportation;Help with stairs or ramp for entrance   Can travel by private vehicle        Equipment Recommendations None recommended by PT  Recommendations for Other Services  Rehab consult    Functional Status Assessment Patient has had a recent decline in their functional status and demonstrates the ability to make significant improvements in function in a reasonable and predictable amount of time.     Precautions / Restrictions Precautions Precautions: Fall Recall of Precautions/Restrictions:  Intact Restrictions Weight Bearing Restrictions Per Provider Order: No      Mobility  Bed Mobility Overal bed mobility: Needs Assistance Bed Mobility: Supine to Sit     Supine to sit: Min assist     General bed mobility comments: Min assist for LE and trunk to rise to EOB with cues, extra time.    Transfers Overall transfer level: Needs assistance Equipment used: Rolling walker (2 wheels) Transfers: Sit to/from Stand, Bed to chair/wheelchair/BSC Sit to Stand: Mod assist   Step pivot transfers: Min assist       General transfer comment: Mod assist for boost to stand from edge of bed. Cues for hand placement and weight shift prior to rising. RW for support, min assist for balance and RW control with step pivot transfer to recliner, fair control with descent. Fatigued quickly.    Ambulation/Gait                  Stairs            Wheelchair Mobility     Tilt Bed    Modified Rankin (Stroke Patients Only)       Balance Overall balance assessment: Needs assistance Sitting-balance support: No upper extremity supported, Feet supported Sitting balance-Leahy Scale: Good     Standing balance support: Bilateral upper extremity supported, Reliant on assistive device for balance, During functional activity Standing balance-Leahy Scale: Poor Standing balance comment: Benefits from RW                             Pertinent Vitals/Pain Pain Assessment Pain Assessment: No/denies pain    Home Living Family/patient expects to be  discharged to:: Private residence Living Arrangements: Spouse/significant other Available Help at Discharge: Family;Available 24 hours/day Type of Home: House Home Access: Stairs to enter Entrance Stairs-Rails: Doctor, general practice of Steps: 1 front, 6 back   Home Layout: One level Home Equipment: Rollator (4 wheels);Cane - quad;Shower seat      Prior Function Prior Level of Function :  Independent/Modified Independent;Driving             Mobility Comments: intermittent use of quad cane. Did not get HHPT follow-up after returning home last week and has been declining since. ADLs Comments: ind, driving much less in the past month.     Extremity/Trunk Assessment   Upper Extremity Assessment Upper Extremity Assessment: Defer to OT evaluation    Lower Extremity Assessment Lower Extremity Assessment: Generalized weakness    Cervical / Trunk Assessment Cervical / Trunk Assessment: Kyphotic  Communication   Communication Communication: Impaired Factors Affecting Communication: Hearing impaired    Cognition Arousal: Alert Behavior During Therapy: Flat affect   PT - Cognitive impairments: No apparent impairments                         Following commands: Intact       Cueing Cueing Techniques: Verbal cues     General Comments General comments (skin integrity, edema, etc.): HR 80, SpO2 94% on RA, BP 110/75    Exercises General Exercises - Lower Extremity Ankle Circles/Pumps: AROM, Both, 15 reps, Supine Quad Sets: Strengthening, Both, 15 reps, Supine Gluteal Sets: Strengthening, Both, 15 reps, Supine Hip ABduction/ADduction: Strengthening, Both, 10 reps, Seated Heel Raises: Strengthening, Both, 10 reps, Standing   Assessment/Plan    PT Assessment Patient needs continued PT services  PT Problem List Decreased strength;Decreased activity tolerance;Decreased balance;Decreased knowledge of use of DME;Cardiopulmonary status limiting activity;Decreased mobility       PT Treatment Interventions DME instruction;Gait training;Stair training;Functional mobility training;Therapeutic activities;Therapeutic exercise;Balance training;Neuromuscular re-education;Patient/family education    PT Goals (Current goals can be found in the Care Plan section)  Acute Rehab PT Goals Patient Stated Goal: Get well, consider rehab PT Goal Formulation: With  patient/family Time For Goal Achievement: 07/30/23 Potential to Achieve Goals: Good    Frequency Min 3X/week     Co-evaluation               AM-PAC PT 6 Clicks Mobility  Outcome Measure Help needed turning from your back to your side while in a flat bed without using bedrails?: A Little Help needed moving from lying on your back to sitting on the side of a flat bed without using bedrails?: A Little Help needed moving to and from a bed to a chair (including a wheelchair)?: A Lot Help needed standing up from a chair using your arms (e.g., wheelchair or bedside chair)?: A Lot Help needed to walk in hospital room?: A Little Help needed climbing 3-5 steps with a railing? : A Little 6 Click Score: 16    End of Session Equipment Utilized During Treatment: Gait belt Activity Tolerance: Patient limited by fatigue Patient left: with call bell/phone within reach;in chair;with family/visitor present Nurse Communication: Mobility status PT Visit Diagnosis: Unsteadiness on feet (R26.81);Other abnormalities of gait and mobility (R26.89);Muscle weakness (generalized) (M62.81)    Time: 9058-8993 PT Time Calculation (min) (ACUTE ONLY): 25 min   Charges:   PT Evaluation $PT Eval Moderate Complexity: 1 Mod PT Treatments $Therapeutic Activity: 8-22 mins PT General Charges $$ ACUTE PT VISIT: 1 Visit  Leontine Roads, PT, DPT [facility]  Rehabilitation Services Physical Therapist Office: [phone] Website: [email or web address]   Leontine GORMAN Roads 07/16/2023, 1:05 PM

## 2023-07-16 NOTE — Evaluation (Addendum)
 Clinical/Bedside Swallow Evaluation Patient Details  Name: Jose O Staron Jr. MRN: [id number] Date of Birth: 1945-06-18  Today's Date: 07/16/2023 Time: SLP Start Time (ACUTE ONLY): 1002 SLP Stop Time (ACUTE ONLY): 1015 SLP Time Calculation (min) (ACUTE ONLY): 13 min  Past Medical History:  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   Alcoholic polyneuropathy (HCC)    Allergy    Asthma    BPH (benign prostatic hyperplasia)    Cardiogenic shock (HCC) 06/29/2023   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 failure) (HCC) 04/23/2023   COPD (chronic obstructive pulmonary disease) (HCC)    Depression    Depression    Diverticulosis    Dyspnea on exertion    Elevated LDL cholesterol level    GERD with stricture    Gout    Hiatal hernia    Hypertension    Hypotension 06/29/2023   Hypotension after procedure 06/29/2023   IPF (idiopathic pulmonary fibrosis) (HCC) 04/23/2023   Kidney stones    2 times - passed stones, no surgery required   Lumbar and sacral osteoarthritis    Mitral regurgitation 04/23/2023   OSA on CPAP 04/23/2023   Peripheral polyneuropathy    Shock (HCC) 07/01/2023   Sleep apnea    wears CPAP   Wears hearing aid    Bil   Past Surgical History:  Past Surgical History:  Procedure Laterality Date   ARTERIAL LINE INSERTION Left 06/29/2023   Procedure: ARTERIAL LINE INSERTION;  Surgeon: Wonda Sharper, MD;  Location: [facility] INVASIVE CV LAB;  Service: Cardiovascular;  Laterality: Left;   CENTRAL LINE INSERTION Right 06/29/2023   Procedure: CENTRAL LINE INSERTION;  Surgeon: Wonda Sharper, MD;  Location: [facility] INVASIVE CV LAB;  Service: Cardiovascular;  Laterality: Right;   COLONOSCOPY     CORONARY ATHERECTOMY N/A 06/29/2023   Procedure: CORONARY ATHERECTOMY;  Surgeon: Ladona Heinz, MD;  Location: [facility] INVASIVE CV LAB;  Service: Cardiovascular;  Laterality: N/A;   CORONARY STENT INTERVENTION N/A 06/29/2023   Procedure: CORONARY  STENT INTERVENTION;  Surgeon: Ladona Heinz, MD;  Location: [facility] INVASIVE CV LAB;  Service: Cardiovascular;  Laterality: N/A;   INGUINAL HERNIA REPAIR     PERICARDIOCENTESIS N/A 06/29/2023   Procedure: PERICARDIOCENTESIS;  Surgeon: Wonda Sharper, MD;  Location: [facility] INVASIVE CV LAB;  Service: Cardiovascular;  Laterality: N/A;   POLYPECTOMY     RIGHT/LEFT HEART CATH AND CORONARY ANGIOGRAPHY N/A 11/19/2022   Procedure: RIGHT/LEFT HEART CATH AND CORONARY ANGIOGRAPHY;  Surgeon: Verlin Lonni BIRCH, MD;  Location: [facility] INVASIVE CV LAB;  Service: Cardiovascular;  Laterality: N/A;   TONSILLECTOMY AND ADENOIDECTOMY     TRANSESOPHAGEAL ECHOCARDIOGRAM (CATH LAB) N/A 05/18/2023   Procedure: TRANSESOPHAGEAL ECHOCARDIOGRAM;  Surgeon: Francyne Headland, MD;  Location: [facility] INVASIVE CV LAB;  Service: Cardiovascular;  Laterality: N/A;   HPI:  Jose Hines is a 78 yo male presenting to ED 7/8  from advanced heart failure clinic with new onset A-fib with RVR. Swan placed 7/9. CT Abdomen Pelvis shows small bilateral pleural effusions with partial compressive atelectasis of the lower lobes. PMH includes tachycardia, CAD, cardiomyopathy, pulmonary fibrosis, GERD with esophageal stricture   Assessment / Plan / Recommendation  Clinical Impression  SLP consulted after pt coughed while taking large potassium pill per nursing. He denies difficulty swallowing and states that his GERD is typically well controlled with medications but he has recently experienced persistent nausea which impacts intake. He was able to take a few sips of water  without signs clinically  concerning for aspiration. When trying applesauce, he grew nauseas and gagged repeatedly. Question impact of potential esophageal component given his history of esophageal stricture. Continue current diet with pills given one at a time. SLP will f/u at least briefly for further assessment but the need for post-acute dysphagia intervention is not anticipated. SLP Visit  Diagnosis: Dysphagia, unspecified (R13.10)    Aspiration Risk  Mild aspiration risk    Diet Recommendation Regular;Thin liquid    Liquid Administration via: Cup;Straw Medication Administration: Whole meds with liquid Supervision: Patient able to self feed Compensations: Slow rate;Small sips/bites Postural Changes: Seated upright at 90 degrees;Remain upright for at least 30 minutes after po intake    Other  Recommendations Oral Care Recommendations: Oral care BID     Assistance Recommended at Discharge    Functional Status Assessment Patient has had a recent decline in their functional status and demonstrates the ability to make significant improvements in function in a reasonable and predictable amount of time.  Frequency and Duration min 2x/week  1 week       Prognosis Prognosis for improved oropharyngeal function: Good Barriers to Reach Goals: Time post onset      Swallow Study   General HPI: Jose Hines is a 78 yo male presenting to ED 7/8  from advanced heart failure clinic with new onset A-fib with RVR. Swan placed 7/9. CT Abdomen Pelvis shows small bilateral pleural effusions with partial compressive atelectasis of the lower lobes. PMH includes tachycardia, CAD, cardiomyopathy, pulmonary fibrosis Type of Study: Bedside Swallow Evaluation Previous Swallow Assessment: none in chart Diet Prior to this Study: Regular;Thin liquids (Level 0) Temperature Spikes Noted: No Respiratory Status: Room air History of Recent Intubation: No Behavior/Cognition: Alert;Cooperative Oral Cavity Assessment: Within Functional Limits Oral Care Completed by SLP: No Oral Cavity - Dentition: Adequate natural dentition Vision: Functional for self-feeding Self-Feeding Abilities: Able to feed self Patient Positioning: Upright in chair Baseline Vocal Quality: Normal Volitional Cough: Strong Volitional Swallow: Able to elicit    Oral/Motor/Sensory Function Overall Oral Motor/Sensory  Function: Within functional limits   Ice Chips Ice chips: Not tested   Thin Liquid Thin Liquid: Within functional limits Presentation: Straw;Self Fed    Nectar Thick Nectar Thick Liquid: Not tested   Honey Thick Honey Thick Liquid: Not tested   Puree Puree: Within functional limits Presentation: Spoon   Solid     Solid: Not tested      Damien Blumenthal, M.A., CCC-SLP Speech Language Pathology, Acute Rehabilitation Services  Secure Chat preferred [phone]  07/16/2023,10:32 AM

## 2023-07-16 NOTE — Progress Notes (Addendum)
 Swan #s this afternoon CO 4.1 CI 1.9 CVP 9 PA 18/7 PCWP 5 PAPi 1  Stop lasix  gtt. Continue milrinone  at 0.375 mcg/kg/min.  Remains very tenuous. Will see how he does over the next few days.   Manuelita Dutch, PA-C

## 2023-07-17 ENCOUNTER — Inpatient Hospital Stay (HOSPITAL_COMMUNITY)

## 2023-07-17 DIAGNOSIS — I493 Ventricular premature depolarization: Secondary | ICD-10-CM | POA: Diagnosis not present

## 2023-07-17 DIAGNOSIS — I5023 Acute on chronic systolic (congestive) heart failure: Secondary | ICD-10-CM | POA: Diagnosis not present

## 2023-07-17 DIAGNOSIS — I4891 Unspecified atrial fibrillation: Secondary | ICD-10-CM | POA: Diagnosis not present

## 2023-07-17 LAB — MAGNESIUM: Magnesium: 1.9 mg/dL (ref 1.7–2.4)

## 2023-07-17 LAB — CBC
HCT: 41.1 % (ref 39.0–52.0)
Hemoglobin: 14.4 g/dL (ref 13.0–17.0)
MCH: 35.6 pg — ABNORMAL HIGH (ref 26.0–34.0)
MCHC: 35 g/dL (ref 30.0–36.0)
MCV: 101.5 fL — ABNORMAL HIGH (ref 80.0–100.0)
Platelets: 206 K/uL (ref 150–400)
RBC: 4.05 MIL/uL — ABNORMAL LOW (ref 4.22–5.81)
RDW: 13.9 % (ref 11.5–15.5)
WBC: 9.7 K/uL (ref 4.0–10.5)
nRBC: 0.2 % (ref 0.0–0.2)

## 2023-07-17 LAB — COMPREHENSIVE METABOLIC PANEL WITH GFR
ALT: 909 U/L — ABNORMAL HIGH (ref 0–44)
AST: 409 U/L — ABNORMAL HIGH (ref 15–41)
Albumin: 3.5 g/dL (ref 3.5–5.0)
Alkaline Phosphatase: 99 U/L (ref 38–126)
Anion gap: 13 (ref 5–15)
BUN: 23 mg/dL (ref 8–23)
CO2: 32 mmol/L (ref 22–32)
Calcium: 9.3 mg/dL (ref 8.9–10.3)
Chloride: 87 mmol/L — ABNORMAL LOW (ref 98–111)
Creatinine, Ser: 1.14 mg/dL (ref 0.61–1.24)
GFR, Estimated: >60 mL/min (ref >60)
Glucose, Bld: 122 mg/dL — ABNORMAL HIGH (ref 70–99)
Potassium: 3.4 mmol/L — ABNORMAL LOW (ref 3.5–5.1)
Sodium: 132 mmol/L — ABNORMAL LOW (ref 135–145)
Total Bilirubin: 1.6 mg/dL — ABNORMAL HIGH (ref 0.0–1.2)
Total Protein: 6.8 g/dL (ref 6.5–8.1)

## 2023-07-17 LAB — COOXEMETRY PANEL
Carboxyhemoglobin: 1.9 % — ABNORMAL HIGH (ref 0.5–1.5)
Methemoglobin: <0.7 % (ref 0.0–1.5)
O2 Saturation: 62.5 %
Total hemoglobin: 14.8 g/dL (ref 12.0–16.0)

## 2023-07-17 MED ORDER — FUROSEMIDE 10 MG/ML IJ SOLN
80.0000 mg | Freq: Four times a day (QID) | INTRAMUSCULAR | Status: AC
  Administered 2023-07-17 (×2): 80 mg via INTRAVENOUS
  Filled 2023-07-17: qty 8

## 2023-07-17 MED ORDER — FUROSEMIDE 10 MG/ML IJ SOLN
80.0000 mg | Freq: Two times a day (BID) | INTRAMUSCULAR | Status: DC
  Filled 2023-07-17: qty 8

## 2023-07-17 MED ORDER — NICOTINE 21 MG/24HR TD PT24: 21.0000 mg | MEDICATED_PATCH | Freq: Every day | TRANSDERMAL | Status: DC

## 2023-07-17 MED ORDER — MAGNESIUM OXIDE -MG SUPPLEMENT 400 (240 MG) MG PO TABS
800.0000 mg | ORAL_TABLET | Freq: Once | ORAL | Status: AC
  Administered 2023-07-17: 800 mg via ORAL
  Filled 2023-07-17: qty 2

## 2023-07-17 MED ORDER — POTASSIUM CHLORIDE CRYS ER 20 MEQ PO TBCR
40.0000 meq | EXTENDED_RELEASE_TABLET | Freq: Once | ORAL | Status: AC
  Administered 2023-07-17: 40 meq via ORAL
  Filled 2023-07-17: qty 2

## 2023-07-17 MED ORDER — MAGNESIUM SULFATE 2 GM/50ML IV SOLN
2.0000 g | Freq: Once | INTRAVENOUS | Status: AC
  Administered 2023-07-17: 2 g via INTRAVENOUS
  Filled 2023-07-17: qty 50

## 2023-07-17 MED ORDER — AMIODARONE LOAD VIA INFUSION
150.0000 mg | Freq: Once | INTRAVENOUS | Status: AC
  Administered 2023-07-17: 150 mg via INTRAVENOUS
  Filled 2023-07-17: qty 83.34

## 2023-07-17 NOTE — Progress Notes (Signed)
 PT placed on CPAP by RT @ 20:35.   07/17/23 2035  BiPAP/CPAP/SIPAP  BiPAP/CPAP/SIPAP Pt Type Adult  BiPAP/CPAP/SIPAP Resmed  Dentures removed? Not applicable  Respiratory Rate 16 breaths/min  EPAP 10 cmH2O  FiO2 (%) 21 %  Patient Home Machine Yes  Safety Check Completed by RT for Home Unit Yes, no issues noted  Patient Home Mask Yes  Patient Home Tubing Yes  Auto Titrate No  CPAP/SIPAP surface wiped down Yes  Device Plugged into RED Power Outlet Yes

## 2023-07-17 NOTE — Progress Notes (Signed)
 Advanced Heart Failure Rounding Note  Cardiologist: Gordy Bergamo, MD  AHF: Dr. Gardenia  Chief Complaint: a/c CHF + Afib w/ RVR   Patient Profile   78 y/o male w/ chronic systolic heart failure EF 20-25%, mod-severe MR and CAD s/p recent PCI to LAD c/b coronary perforation and tamponade requiring urgent pericardiocentesis, now readmitted for a/c CHF w/ low output in the setting of new Afib w/ RVR.    Subjective:    7/9 Transferred to the ICU for Cardiogenic shock. Swan placed at bedside.   Remains on milrinone  0.375. Back in AF overnight.   Co-ox 68% -> 63%   Swan numbers done personally. CI 1.6  Feels weak Denies SOB. Would like to go home but understands that is not possible currently.    Objective:   Weight Range: 88 kg Body mass index is 27.06 kg/m.   Vital Signs:   Temp:  [96.6 F (35.9 C)-97.3 F (36.3 C)] 97.3 F (36.3 C) (07/12 1100) Pulse Rate:  [69-143] 128 (07/12 1100) Resp:  [11-27] 22 (07/12 1100) BP: (78-125)/(55-98) 102/77 (07/12 1100) SpO2:  [88 %-98 %] 91 % (07/12 1100) FiO2 (%):  [21 %] 21 % (07/11 2117) Last BM Date :  (PTA)  Weight change: Filed Weights   07/14/23 1156 07/15/23 0500 07/16/23 0500  Weight: 99.2 kg 79 kg 88 kg    Intake/Output:   Intake/Output Summary (Last 24 hours) at 07/17/2023 1304 Last data filed at 07/17/2023 1000 Gross per 24 hour  Intake 934.8 ml  Output 1265 ml  Net -330.2 ml      Physical Exam    General:  Sitting in chair. Weak appearing. NAD HEENT: normal Neck: supple. RIJ swan  Carotids 2+ bilat; no bruits. No lymphadenopathy or thryomegaly appreciated. Cor: irreg tachy Lungs: clear Abdomen: soft, nontender, nondistended. No hepatosplenomegaly. No bruits or masses. Good bowel sounds. Extremities: no cyanosis, clubbing, rash, trace edema Neuro: alert & orientedx3, cranial nerves grossly intact. moves all 4 extremities w/o difficulty. Affect pleasant   Telemetry   SR AF 110-130 Personally  reviewed  Labs    CBC Recent Labs    07/16/23 0359 07/17/23 0424  WBC 12.6* 9.7  HGB 14.1 14.4  HCT 41.6 41.1  MCV 103.0* 101.5*  PLT 206 206   Basic Metabolic Panel Recent Labs    92/88/74 0359 07/17/23 0424  NA 134* 132*  K 4.1 3.4*  CL 89* 87*  CO2 31 32  GLUCOSE 140* 122*  BUN 21 23  CREATININE 1.12 1.14  CALCIUM  9.0 9.3  MG 2.1 1.9   Liver Function Tests Recent Labs    07/16/23 0359 07/17/23 0424  AST 912* 409*  ALT 1,276* 909*  ALKPHOS 98 99  BILITOT 1.6* 1.6*  PROT 7.0 6.8  ALBUMIN 3.7 3.5   No results for input(s): LIPASE, AMYLASE in the last 72 hours.  Cardiac Enzymes No results for input(s): CKTOTAL, CKMB, CKMBINDEX, TROPONINI in the last 72 hours.  BNP: BNP (last 3 results) Recent Labs    06/14/23 1200 06/29/23 1703 07/13/23 1130  BNP 657.2* 575.2* 1,339.7*    ProBNP (last 3 results) Recent Labs    11/13/22 1539  PROBNP 400     D-Dimer No results for input(s): DDIMER in the last 72 hours. Hemoglobin A1C No results for input(s): HGBA1C in the last 72 hours. Fasting Lipid Panel No results for input(s): CHOL, HDL, LDLCALC, TRIG, CHOLHDL, LDLDIRECT in the last 72 hours. Thyroid  Function Tests No results for input(s):  TSH, T4TOTAL, T3FREE, THYROIDAB in the last 72 hours.  Invalid input(s): FREET3  Other results:   Imaging    No results found.    Medications:     Scheduled Medications:  apixaban   5 mg Oral BID   aspirin  EC  81 mg Oral Daily   budesonide   0.5 mg Nebulization BID   buPROPion   150 mg Oral q morning   Chlorhexidine  Gluconate Cloth  6 each Topical Daily   clopidogrel   75 mg Oral Daily   digoxin   0.125 mg Oral Daily   dronabinol   2.5 mg Oral QAC lunch   ezetimibe   10 mg Oral Daily   mexiletine  150 mg Oral Q8H   pantoprazole   40 mg Oral QAC breakfast   Pirfenidone   801 mg Oral TID WC   polyethylene glycol  17 g Oral BID   senna-docusate  2 tablet Oral QHS    sodium chloride  flush  10-40 mL Intracatheter Q12H   sodium chloride  flush  3 mL Intravenous Q12H    Infusions:  amiodarone  60 mg/hr (07/17/23 1241)   magnesium  sulfate bolus IVPB 2 g (07/17/23 1303)   milrinone  0.375 mcg/kg/min (07/17/23 0833)    PRN Medications: acetaminophen  **OR** acetaminophen , albuterol , ondansetron  (ZOFRAN ) IV, mouth rinse, polyethylene glycol, sodium chloride  flush    Assessment/Plan   1. Acute on chronic systolic CHF w/ Low Output:  - Ischemic cardiomyopathy.   - Echo in 6/25 with EF 25-30%, mild RV dysfunction, moderate MR.   - Readmitted with a/c CHF with low-output in setting of Afib with RVR. Lactic acid peaked at 3.1, now cleared. - Echo w/ EF now < 20%, RV mildly reduced, moderate MR - Remains on milrinone  0.375 CI still low - Need to get him back to NSR - Volume up restart diuretics - Swan numbers consistent with low output HF with R>L HF. I am worried he is end-stage. EF is 25-30% at baseline and likely further reduced by AF. Unclear how much better he will get by maintaining NSR. Wife reports his functional capacity and QoL has been poor for almost a year. Will continue aggressive care for now but will consult Palliative Care to establish GOC. We discussed this today  2. Atrial fibrillation: New diagnosis. - Converted to SR 07/09. Back in AF overnight - Increase amio to 60/hr. Rebolus - Continue heparin  gtt >> will need to switch to eliquis  at some point.   3. CAD: Has CTO of RCA.  In 6/25, had complicated PCI to severe proximal LAD stenosis with orbital atherectomy and DES.  This was complicated by coronary perforation and tamponade.  - He is on Plavix  and ASA though he had not taken for several days due to nausea.   - Will stop ASA after 1 month post-PCI given need for anticoagulation. Will need to continue Plavix .  - Holding statin with elevated LFTs, plan to restart prior to discharge - No change  4. H/o pericardial tamponade: Due to  coronary perforation in 6/25 associated with LAD PCI. S/p pericardiocentesis.   5. Mitral regurgitation: Moderate-severe by TEE in 5/25 and being considered for mTEER, but noted as no more than moderate on recent echoes.   - Echo this admission w/ moderate MR  6. 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  - Improving  7. 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  - mexiletine discontinued on admit now that he is on amiodarone . Added back on 07/09 d/t frequent PVCs. Burden improved.   8. Idiopathic pulmonary fibrosis:  Continue pirfenidone . - currently on IV amio for rate/rhythm control, long term use not ideal   9. Physical debility, severe Continue aggressive PT/OT.  CRITICAL CARE Performed by: Cherrie Sieving  Total critical care time: 46 minutes  Critical care time was exclusive of separately billable procedures and treating other patients.  Critical care was necessary to treat or prevent imminent or life-threatening deterioration.  Critical care was time spent personally by me (independent of midlevel providers or residents) on the following activities: development of treatment plan with patient and/or surrogate as well as nursing, discussions with consultants, evaluation of patient's response to treatment, examination of patient, obtaining history from patient or surrogate, ordering and performing treatments and interventions, ordering and review of laboratory studies, ordering and review of radiographic studies, pulse oximetry and re-evaluation of patient's condition.  Length of Stay: 4  Sieving Cherrie, MD  07/17/2023, 1:04 PM  Advanced Heart Failure Team Pager [phone] (M-F; 7a - 5p)  Please contact [facility] for night-coverage after hours (5p -7a ) and weekends on [email or web address]

## 2023-07-17 NOTE — Plan of Care (Signed)
  Problem: Clinical Measurements: Goal: Respiratory complications will improve Outcome: Progressing   Problem: Activity: Goal: Risk for activity intolerance will decrease Outcome: Progressing   Problem: Pain Managment: Goal: General experience of comfort will improve and/or be controlled Outcome: Progressing

## 2023-07-17 NOTE — Plan of Care (Deleted)
 Laymon Fritz, RN

## 2023-07-17 NOTE — Progress Notes (Signed)
 OT Cancellation Note  Patient Details Name: Jose Hines. MRN: [id number] DOB: November 10, 1945   Cancelled Treatment:    Reason Eval/Treat Not Completed: Patient declined, no reason specified Pt with discussion with Dr. Cherrie that included palliative and pt declined OT at this time .  I need sometime. OT to check back at patients next readiness.   [name] 07/17/2023, 12:32 PM

## 2023-07-17 NOTE — Plan of Care (Signed)
  Problem: Nutrition: Goal: Adequate nutrition will be maintained 07/17/2023 1713 by Alva Laymon SQUIBB, RN Outcome: Progressing 07/17/2023 1709 by Alva Laymon SQUIBB, RN Outcome: Progressing

## 2023-07-18 DIAGNOSIS — Z515 Encounter for palliative care: Secondary | ICD-10-CM

## 2023-07-18 DIAGNOSIS — I4891 Unspecified atrial fibrillation: Secondary | ICD-10-CM | POA: Diagnosis not present

## 2023-07-18 DIAGNOSIS — Z66 Do not resuscitate: Secondary | ICD-10-CM

## 2023-07-18 DIAGNOSIS — Z7189 Other specified counseling: Secondary | ICD-10-CM

## 2023-07-18 DIAGNOSIS — I5023 Acute on chronic systolic (congestive) heart failure: Secondary | ICD-10-CM | POA: Diagnosis not present

## 2023-07-18 DIAGNOSIS — I493 Ventricular premature depolarization: Secondary | ICD-10-CM | POA: Diagnosis not present

## 2023-07-18 LAB — CBC
HCT: 43.7 % (ref 39.0–52.0)
Hemoglobin: 15 g/dL (ref 13.0–17.0)
MCH: 34.6 pg — ABNORMAL HIGH (ref 26.0–34.0)
MCHC: 34.3 g/dL (ref 30.0–36.0)
MCV: 100.9 fL — ABNORMAL HIGH (ref 80.0–100.0)
Platelets: 191 K/uL (ref 150–400)
RBC: 4.33 MIL/uL (ref 4.22–5.81)
RDW: 13.4 % (ref 11.5–15.5)
WBC: 10.4 K/uL (ref 4.0–10.5)
nRBC: 0 % (ref 0.0–0.2)

## 2023-07-18 LAB — COOXEMETRY PANEL
Carboxyhemoglobin: 2.4 % — ABNORMAL HIGH (ref 0.5–1.5)
Methemoglobin: 0.8 % (ref 0.0–1.5)
O2 Saturation: 66.6 %
Total hemoglobin: 15.4 g/dL (ref 12.0–16.0)

## 2023-07-18 LAB — COMPREHENSIVE METABOLIC PANEL WITH GFR
ALT: 638 U/L — ABNORMAL HIGH (ref 0–44)
AST: 165 U/L — ABNORMAL HIGH (ref 15–41)
Albumin: 3.6 g/dL (ref 3.5–5.0)
Alkaline Phosphatase: 108 U/L (ref 38–126)
Anion gap: 13 (ref 5–15)
BUN: 23 mg/dL (ref 8–23)
CO2: 32 mmol/L (ref 22–32)
Calcium: 9.2 mg/dL (ref 8.9–10.3)
Chloride: 84 mmol/L — ABNORMAL LOW (ref 98–111)
Creatinine, Ser: 1.12 mg/dL (ref 0.61–1.24)
GFR, Estimated: >60 mL/min (ref >60)
Glucose, Bld: 139 mg/dL — ABNORMAL HIGH (ref 70–99)
Potassium: 3.3 mmol/L — ABNORMAL LOW (ref 3.5–5.1)
Sodium: 129 mmol/L — ABNORMAL LOW (ref 135–145)
Total Bilirubin: 1.4 mg/dL — ABNORMAL HIGH (ref 0.0–1.2)
Total Protein: 6.9 g/dL (ref 6.5–8.1)

## 2023-07-18 LAB — MAGNESIUM: Magnesium: 2.1 mg/dL (ref 1.7–2.4)

## 2023-07-18 MED ORDER — POTASSIUM CHLORIDE CRYS ER 20 MEQ PO TBCR
40.0000 meq | EXTENDED_RELEASE_TABLET | ORAL | Status: AC
  Administered 2023-07-18 (×2): 40 meq via ORAL
  Filled 2023-07-18 (×2): qty 2

## 2023-07-18 NOTE — Progress Notes (Addendum)
 Advanced Heart Failure Rounding Note  Cardiologist: Gordy Bergamo, MD  AHF: Dr. Gardenia  Chief Complaint: a/c CHF + Afib w/ RVR   Patient Profile   78 y/o male w/ chronic systolic heart failure EF 20-25%, mod-severe MR and CAD s/p recent PCI to LAD c/b coronary perforation and tamponade requiring urgent pericardiocentesis, now readmitted for a/c CHF w/ low output in the setting of new Afib w/ RVR.    Subjective:    7/9 Transferred to the ICU for Cardiogenic shock. Swan placed at bedside.   Remains on milrinone  0.375. Back in AF yesterday/ Now in AFL with RVR today  Co-ox 68% -> 63% -> 67%  PAP: (24-41)/(1-37) 30/13 CVP:  [1 mmHg-35 mmHg] 3 mmHg CO:  [3.7 L/min-4 L/min] 3.7 L/min CI:  [1.71 L/min/m2-1.82 L/min/m2] 1.71 L/min/m2  Remains very weak. Denies CP or SOB  Has met with Palliative Care and now limited code (DNR/DNI)   Objective:   Weight Range: 87 kg Body mass index is 26.75 kg/m.   Vital Signs:   Temp:  [96.6 F (35.9 C)-97.9 F (36.6 C)] 97.3 F (36.3 C) (07/13 0800) Pulse Rate:  [85-165] 106 (07/13 0800) Resp:  [12-27] 18 (07/13 0800) BP: (91-154)/(62-139) 111/77 (07/13 0800) SpO2:  [88 %-96 %] 91 % (07/13 0800) FiO2 (%):  [21 %] 21 % (07/12 2035) Weight:  [87 kg] 87 kg (07/13 0500) Last BM Date :  (PTA)  Weight change: Filed Weights   07/15/23 0500 07/16/23 0500 07/18/23 0500  Weight: 79 kg 88 kg 87 kg    Intake/Output:   Intake/Output Summary (Last 24 hours) at 07/18/2023 0910 Last data filed at 07/18/2023 0700 Gross per 24 hour  Intake 1458.36 ml  Output 2050 ml  Net -591.64 ml      Physical Exam    General:  Weak appearing. No resp difficulty HEENT: normal Neck: supple. RIJ swan. Carotids 2+ bilat; no bruits. No lymphadenopathy or thryomegaly appreciated. Cor: PMI nondisplaced. Irreg tachy Lungs: clear Abdomen: soft, nontender, nondistended. No hepatosplenomegaly. No bruits or masses. Good bowel sounds. Extremities: no  cyanosis, clubbing, rash, edema Neuro: alert & orientedx3, cranial nerves grossly intact. moves all 4 extremities w/o difficulty. Affect pleasant   Telemetry   SR AFL 110-120 Personally reviewed  Labs    CBC Recent Labs    07/17/23 0424 07/18/23 0441  WBC 9.7 10.4  HGB 14.4 15.0  HCT 41.1 43.7  MCV 101.5* 100.9*  PLT 206 191   Basic Metabolic Panel Recent Labs    92/87/74 0424 07/18/23 0441  NA 132* 129*  K 3.4* 3.3*  CL 87* 84*  CO2 32 32  GLUCOSE 122* 139*  BUN 23 23  CREATININE 1.14 1.12  CALCIUM  9.3 9.2  MG 1.9 2.1   Liver Function Tests Recent Labs    07/17/23 0424 07/18/23 0441  AST 409* 165*  ALT 909* 638*  ALKPHOS 99 108  BILITOT 1.6* 1.4*  PROT 6.8 6.9  ALBUMIN 3.5 3.6   No results for input(s): LIPASE, AMYLASE in the last 72 hours.  Cardiac Enzymes No results for input(s): CKTOTAL, CKMB, CKMBINDEX, TROPONINI in the last 72 hours.  BNP: BNP (last 3 results) Recent Labs    06/14/23 1200 06/29/23 1703 07/13/23 1130  BNP 657.2* 575.2* 1,339.7*    ProBNP (last 3 results) Recent Labs    11/13/22 1539  PROBNP 400     D-Dimer No results for input(s): DDIMER in the last 72 hours. Hemoglobin A1C No results for  input(s): HGBA1C in the last 72 hours. Fasting Lipid Panel No results for input(s): CHOL, HDL, LDLCALC, TRIG, CHOLHDL, LDLDIRECT in the last 72 hours. Thyroid  Function Tests No results for input(s): TSH, T4TOTAL, T3FREE, THYROIDAB in the last 72 hours.  Invalid input(s): FREET3  Other results:   Imaging    DG CHEST PORT 1 VIEW Result Date: 07/17/2023 CLINICAL DATA:  Shortness of breath EXAM: PORTABLE CHEST 1 VIEW COMPARISON:  Film from the previous day. FINDINGS: Cardiac shadow is enlarged. Swan-Ganz catheter is noted deep in the right pulmonary artery stable from the prior exam. Lungs are well aerated bilaterally. Right PICC is noted at the cavoatrial junction. Small effusions are  again noted. No new focal infiltrate is seen. IMPRESSION: Tubes and lines as described. Small effusions persist. Electronically Signed   By: Oneil Devonshire M.D.   On: 07/17/2023 22:23      Medications:     Scheduled Medications:  apixaban   5 mg Oral BID   aspirin  EC  81 mg Oral Daily   budesonide   0.5 mg Nebulization BID   buPROPion   150 mg Oral q morning   Chlorhexidine  Gluconate Cloth  6 each Topical Daily   clopidogrel   75 mg Oral Daily   digoxin   0.125 mg Oral Daily   dronabinol   2.5 mg Oral QAC lunch   ezetimibe   10 mg Oral Daily   mexiletine  150 mg Oral Q8H   pantoprazole   40 mg Oral QAC breakfast   Pirfenidone   801 mg Oral TID WC   polyethylene glycol  17 g Oral BID   potassium chloride   40 mEq Oral Q4H   senna-docusate  2 tablet Oral QHS   sodium chloride  flush  10-40 mL Intracatheter Q12H   sodium chloride  flush  3 mL Intravenous Q12H    Infusions:  amiodarone  60 mg/hr (07/18/23 0700)   milrinone  0.375 mcg/kg/min (07/18/23 0700)    PRN Medications: acetaminophen  **OR** acetaminophen , albuterol , ondansetron  (ZOFRAN ) IV, mouth rinse, polyethylene glycol, sodium chloride  flush    Assessment/Plan   1. Acute on chronic systolic CHF w/ Low Output:  - Ischemic cardiomyopathy.   - Echo in 6/25 with EF 25-30%, mild RV dysfunction, moderate MR.   - Readmitted with a/c CHF with low-output in setting of Afib with RVR. Lactic acid peaked at 3.1, now cleared. - Echo w/ EF now < 20%, RV mildly reduced, moderate MR - Remains on milrinone  0.375 CI still low  - CVP 3 - off diuretics - Swan numbers consistent with low output HF with R>L HF. I am worried he is end-stage. EF is 25-30% at baseline and likely further reduced by AF. Unclear how much better he will get by maintaining NSR but it is his only chance at improvement. Wife reports his functional capacity and QoL has been poor for almost a year. We discussed this in detail today and he is leaning toward comfort care. However  is ok with trying one more DC-CV and see if this helps  2. Atrial fibrillation: New diagnosis. - Converted to SR 07/09. Back in AF -> AFL - Continue amio 60/hr.  - Continue heparin  gtt  - Will make NOP after MN for bedside DC-CV tomorrow  3. CAD: Has CTO of RCA.  In 6/25, had complicated PCI to severe proximal LAD stenosis with orbital atherectomy and DES.  This was complicated by coronary perforation and tamponade.  - Continue DAPT - Can stop ASA after 1 month post-PCI given need for anticoagulation.   4.  H/o pericardial tamponade: Due to coronary perforation in 6/25 associated with LAD PCI. S/p pericardiocentesis.   5. Mitral regurgitation: Moderate-severe by TEE in 5/25 and being considered for mTEER, but noted as no more than moderate on recent echoes.   - Echo this admission w/ moderate MR  6. 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  - Improving with inotrope support  7. 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  - mexiletine discontinued on admit now that he is on amiodarone . Added back on 07/09 d/t frequent PVCs. Burden improved.   8. Idiopathic pulmonary fibrosis: Continue pirfenidone . - currently on IV amio for rate/rhythm control, long term use not ideal   9. Physical debility, severe Continue aggressive PT/OT.  10. AKI - due to ATN/shock cardiorenal - Scr peaked 1.5 now back to normal with inotrope support  11. DNR/DNI  CRITICAL CARE Performed by: Cherrie Sieving  Total critical care time: 50 minutes  Critical care time was exclusive of separately billable procedures and treating other patients.  Critical care was necessary to treat or prevent imminent or life-threatening deterioration.  Critical care was time spent personally by me (independent of midlevel providers or residents) on the following activities: development of treatment plan with patient and/or surrogate as well as nursing, discussions with consultants, evaluation of patient's  response to treatment, examination of patient, obtaining history from patient or surrogate, ordering and performing treatments and interventions, ordering and review of laboratory studies, ordering and review of radiographic studies, pulse oximetry and re-evaluation of patient's condition.  Length of Stay: 5  Sieving Cherrie, MD  07/18/2023, 9:10 AM  Advanced Heart Failure Team Pager [phone] (M-F; 7a - 5p)  Please contact [facility] for night-coverage after hours (5p -7a ) and weekends on [email or web address]

## 2023-07-18 NOTE — Consult Note (Signed)
 Palliative Medicine Inpatient Consult Note  Consulting Provider: Nat Dutch  Reason for consult:   Palliative Care Consult Services Palliative Medicine Consult  Reason for Consult? GOC discussion   07/18/2023  HPI:  Per intake H&P --> 78 y/o male w/ chronic systolic heart failure EF 20-25%, mod-severe MR and CAD s/p recent PCI to LAD c/b coronary perforation and tamponade requiring urgent pericardiocentesis, now readmitted for a/c CHF w/ low output in the setting of new Afib w/ RVR. Palliative care is involved to support additional goals of care conversations.   Clinical Assessment/Goals of Care:  *Please note that this is a verbal dictation therefore any spelling or grammatical errors are due to the Dragon Medical One system interpretation.  I have reviewed medical records including EPIC notes, labs and imaging, received report from bedside RN, assessed the patient who is resting comfortably in NAD critically ill in appearance.    I met with Jose Hines and his spouse, Jose Hines to further discuss diagnosis prognosis, GOC, EOL wishes, disposition and options.   I introduced Palliative Medicine as specialized medical care for people living with serious illness. It focuses on providing relief from the symptoms and stress of a serious illness. The goal is to improve quality of life for both the patient and the family.  Medical History Review and Understanding:  Jose Hines has a past medical history significant for depression, GERD, HTN, systolic heart failure, CAD, pulmonary fibrosis, and afib.   Social History:  Jose Hines is from [place], [place] . He and his wife, Jose Hines have been married for 31 years. He has one daughter. He worked as a Psychologist, forensic. He loves working on a boat which he is reconstructing in his work space and use to enjoy fishing. He is a man of [employer].   Functional and Nutritional State:  Jose Hines has had decreased activity tolerance for the past six months. He  has been able to walk in house home to different rooms though gets fatigued easily. His wife assist him with bADLs as needed.   Advance Directives:  A detailed discussion was had today regarding advanced directives.  Jose Hines has a living will, his wife shares that she plans to bring a copy in.   Code Status:  Concepts specific to code status, artifical feeding and hydration, continued IV antibiotics and rehospitalization was had.  The difference between a aggressive medical intervention path  and a palliative comfort care path for this patient at this time was had.   Encouraged patient/family to consider DNR/DNI status understanding evidenced based poor outcomes in similar hospitalized patient, as the cause of arrest is likely associated with advanced chronic/terminal illness rather than an easily reversible acute cardio-pulmonary event. I explained that DNR/DNI does not change the medical plan and it only comes into effect after a person has arrested (died).  It is a protective measure to keep us  from harming the patient in their last moments of life. Jose Hines was agreeable to DNR/DNI with understanding that patient would not receive CPR or intubation.   Discussion:  Jose Hines shares very little with me and defers to his wife to do much of the talking. I was able to provide to he and his wife updates and education on the progressive nature of CHF and pulmonary fibrosis. We discussed the concerns associated with [name]'s disease burden. We reviewed best case and worst case scenarios in the setting of his present disease burden.   Jose Hines and his wife understand that he has had difficult to control A-Fib  and this may require cardioversion. Jose Hines shares he would want this intervention if offered. He states that he is not at a point where he is ready to die yet. We reviewed even if not ready his body may not sync up with his mind and it may be the tincture of time to see where he evolves.   We lamented on how trial  some of a year and more specifically month it has been for Jose Hines. I shared understanding and explained if at any point he determines the present treatments are too much then comfort care is always an options. We also discussed the possibility of hospice care.   Jose Hines was having a tough time hearing the information provided as it was heavy information for him to interpret. I shared that we would remain available to support them.  Patient depends on his wife to help determine the path forward. We reviewed that the advanced heart failure team would be transparent in regards to if they felt Jose Hines could improve further.   Discussed the importance of continued conversation with family and their  medical providers regarding overall plan of care and treatment options, ensuring decisions are within the context of the patients values and GOCs.  Decision Maker: Jose Hines (Spouse): [phone] (Mobile)   SUMMARY OF RECOMMENDATIONS   DNAR/DNI  Open and honest conversations held in the setting of patients advanced disease burden  Explained comfort care and hospice as an option  Patient would like to continue present measures to see if improvements can be made  Ongoing PMT support  Code Status/Advance Care Planning: DNAR/DNI  Palliative Prophylaxis:  Aspiration, Bowel Regimen, Delirium Protocol, Frequent Pain Assessment, Oral Care, Palliative Wound Care, and Turn Reposition  Additional Recommendations (Limitations, Scope, Preferences): Continue current care.  Psycho-social/Spiritual:  Desire for further Chaplaincy support: Yes Additional Recommendations: Education on progressive disease burden.    Prognosis: Limited overall.   Discharge Planning: To be determined.   Vitals:   07/18/23 0900 07/18/23 1005  BP: 123/86 (!) 125/100  Pulse: 100 (!) 107  Resp: 17 (!) 21  Temp: (!) 97.3 F (36.3 C) (!) 97.5 F (36.4 C)  SpO2: 94% 93%    Intake/Output Summary (Last 24 hours) at 07/18/2023  1303 Last data filed at 07/18/2023 0901 Gross per 24 hour  Intake 1105.33 ml  Output 2300 ml  Net -1194.67 ml   Last Weight  Most recent update: 07/18/2023  6:13 AM    Weight  87 kg (191 lb 12.8 oz)            Gen:  Elderly Caucasian M chronically ill appearing HEENT: moist mucous membranes CV: Irregular rate and rhythm  PULM:  On RA, breathing is nonlabored ABD: soft/nontender  EXT: No edema  Neuro: Awake and alert  PPS: 30%   This conversation/these recommendations were discussed with patient primary care team, Dr. Bensimhon _______________________________________ Rosaline Becton [facility] Palliative Medicine Team Team Cell Phone: [phone] Please utilize secure chat with additional questions, if there is no response within 30 minutes please call the above phone number  Total Time: 75 Billing based on MDM: High  Palliative Medicine Team providers are available by phone from 7am to 7pm daily and can be reached through the team cell phone.  Should this patient require assistance outside of these hours, please call the patient's attending physician.

## 2023-07-18 NOTE — Progress Notes (Signed)
 Pt refusing all oral medications and states he does not wish to proceed with cardioversion in the AM, Bensimhon MD made aware by [facility] RN and Asberry PEAK, MD acknowledged.

## 2023-07-18 NOTE — Progress Notes (Signed)
   07/18/23 2317  BiPAP/CPAP/SIPAP  $ Non-Invasive Home Ventilator  Subsequent  BiPAP/CPAP/SIPAP Pt Type Adult  BiPAP/CPAP/SIPAP Resmed  Dentures removed? Not applicable  Respiratory Rate 16 breaths/min  EPAP 10 cmH2O  FiO2 (%) 21 %  Patient Home Machine Yes  Safety Check Completed by RT for Home Unit Yes, no issues noted  Patient Home Mask Yes  Patient Home Tubing Yes  Auto Titrate No  CPAP/SIPAP surface wiped down Yes  Device Plugged into RED Power Outlet Yes

## 2023-07-18 NOTE — Plan of Care (Signed)

## 2023-07-18 NOTE — Progress Notes (Signed)
 OT Cancellation Note  Patient Details Name: Jose Hines. MRN: [id number] DOB: Jul 02, 1945   Cancelled Treatment:    Reason Eval/Treat Not Completed: Patient declined, no reason specified (pt recently met with palliative team per RN and now declines to participate with activity for OT eval)   [name] 07/18/2023, 10:55 AM

## 2023-07-19 ENCOUNTER — Telehealth (HOSPITAL_BASED_OUTPATIENT_CLINIC_OR_DEPARTMENT_OTHER): Payer: Self-pay | Admitting: Licensed Clinical Social Worker

## 2023-07-19 DIAGNOSIS — Z66 Do not resuscitate: Secondary | ICD-10-CM | POA: Diagnosis not present

## 2023-07-19 DIAGNOSIS — Z515 Encounter for palliative care: Secondary | ICD-10-CM | POA: Diagnosis not present

## 2023-07-19 DIAGNOSIS — I5023 Acute on chronic systolic (congestive) heart failure: Secondary | ICD-10-CM | POA: Diagnosis not present

## 2023-07-19 DIAGNOSIS — Z7189 Other specified counseling: Secondary | ICD-10-CM | POA: Diagnosis not present

## 2023-07-19 LAB — COMPREHENSIVE METABOLIC PANEL WITH GFR
ALT: 377 U/L — ABNORMAL HIGH (ref 0–44)
AST: 80 U/L — ABNORMAL HIGH (ref 15–41)
Albumin: 3.2 g/dL — ABNORMAL LOW (ref 3.5–5.0)
Alkaline Phosphatase: 92 U/L (ref 38–126)
Anion gap: 11 (ref 5–15)
BUN: 18 mg/dL (ref 8–23)
CO2: 30 mmol/L (ref 22–32)
Calcium: 8.8 mg/dL — ABNORMAL LOW (ref 8.9–10.3)
Chloride: 88 mmol/L — ABNORMAL LOW (ref 98–111)
Creatinine, Ser: 0.87 mg/dL (ref 0.61–1.24)
GFR, Estimated: >60 mL/min (ref >60)
Glucose, Bld: 121 mg/dL — ABNORMAL HIGH (ref 70–99)
Potassium: 3.1 mmol/L — ABNORMAL LOW (ref 3.5–5.1)
Sodium: 129 mmol/L — ABNORMAL LOW (ref 135–145)
Total Bilirubin: 1.5 mg/dL — ABNORMAL HIGH (ref 0.0–1.2)
Total Protein: 6.1 g/dL — ABNORMAL LOW (ref 6.5–8.1)

## 2023-07-19 LAB — CULTURE, BLOOD (ROUTINE X 2)
Culture: NO GROWTH
Culture: NO GROWTH
Special Requests: ADEQUATE
Special Requests: ADEQUATE

## 2023-07-19 LAB — MAGNESIUM: Magnesium: 1.9 mg/dL (ref 1.7–2.4)

## 2023-07-19 LAB — COOXEMETRY PANEL
Carboxyhemoglobin: 2.5 % — ABNORMAL HIGH (ref 0.5–1.5)
Methemoglobin: <0.7 % (ref 0.0–1.5)
O2 Saturation: 74.6 %
Total hemoglobin: 15.3 g/dL (ref 12.0–16.0)

## 2023-07-19 LAB — CBC
HCT: 42.7 % (ref 39.0–52.0)
Hemoglobin: 14.6 g/dL (ref 13.0–17.0)
MCH: 34.8 pg — ABNORMAL HIGH (ref 26.0–34.0)
MCHC: 34.2 g/dL (ref 30.0–36.0)
MCV: 101.7 fL — ABNORMAL HIGH (ref 80.0–100.0)
Platelets: 161 K/uL (ref 150–400)
RBC: 4.2 MIL/uL — ABNORMAL LOW (ref 4.22–5.81)
RDW: 13.4 % (ref 11.5–15.5)
WBC: 10.1 K/uL (ref 4.0–10.5)
nRBC: 0 % (ref 0.0–0.2)

## 2023-07-19 MED ORDER — MAGNESIUM SULFATE 2 GM/50ML IV SOLN
2.0000 g | Freq: Once | INTRAVENOUS | Status: AC
  Administered 2023-07-19: 2 g via INTRAVENOUS
  Filled 2023-07-19: qty 50

## 2023-07-19 MED ORDER — MORPHINE SULFATE (PF) 2 MG/ML IV SOLN: 2.0000 mg | INTRAVENOUS | Status: DC | PRN

## 2023-07-19 MED ORDER — POTASSIUM CHLORIDE 10 MEQ/50ML IV SOLN
10.0000 meq | INTRAVENOUS | Status: AC
  Administered 2023-07-19 (×4): 10 meq via INTRAVENOUS
  Filled 2023-07-19 (×4): qty 50

## 2023-07-19 MED ORDER — DRONABINOL 2.5 MG PO CAPS: 2.5000 mg | ORAL_CAPSULE | Freq: Three times a day (TID) | ORAL | Status: DC | PRN

## 2023-07-19 MED ORDER — SPIRITUS FRUMENTI
1.0000 | Freq: Once | ORAL | Status: AC
  Administered 2023-07-19: 1 via ORAL
  Filled 2023-07-19: qty 1

## 2023-07-19 NOTE — Progress Notes (Addendum)
 PT Cancellation Note  Patient Details Name: Jose Hines. MRN: [id number] DOB: 20-Jun-1945   Cancelled Treatment:    Reason Eval/Treat Not Completed: Other (comment)  Noted decision for transition to comfort care. Spoke with patient. Has no further acute rehab needs. Appreciative of visit. Signing-off.  Leontine Roads, PT, DPT [facility]  Rehabilitation Services Physical Therapist Office: [phone] Website: [email or web address]  Leontine GORMAN Roads 07/19/2023, 1:51 PM

## 2023-07-19 NOTE — Plan of Care (Signed)
  Problem: Clinical Measurements: Goal: Will remain free from infection Outcome: Progressing Goal: Respiratory complications will improve Outcome: Progressing   Problem: Elimination: Goal: Will not experience complications related to urinary retention Outcome: Progressing   Problem: Pain Managment: Goal: General experience of comfort will improve and/or be controlled Outcome: Progressing   Problem: Safety: Goal: Ability to remain free from injury will improve Outcome: Progressing

## 2023-07-19 NOTE — Progress Notes (Signed)
 Inpatient Rehab Admissions Coordinator:  Note pt is going to transition to comfort care tomorrow. AC will sign off.   Tinnie Yvone Cohens, MS, CCC-SLP Admissions Coordinator [phone]

## 2023-07-19 NOTE — Telephone Encounter (Signed)
 H&V Care Navigation CSW Progress Note  Clinical Social Worker has been followingper paramedicine order to complete SDOH assessment for paramedicine as appropriate. Per chart review at this time pt still remains inpatient, will remain available as needed should paramedicine re-engage with pt.  Patient is participating in a Managed Medicaid Plan:  No, [employer] Medicare only  SDOH Screenings   Food Insecurity: No Food Insecurity (07/15/2023)  Housing: Low Risk  (07/15/2023)  Transportation Needs: No Transportation Needs (07/15/2023)  Utilities: Not At Risk (07/15/2023)  Social Connections: Moderately Isolated (07/15/2023)  Tobacco Use: Medium Risk (07/13/2023)     Jose Hines, MSW, LCSW Clinical Social Worker II [facility] Heart/Vascular Care Navigation  [phone]- work cell phone (preferred)

## 2023-07-19 NOTE — Progress Notes (Signed)
 Advanced Heart Failure Rounding Note  Cardiologist: Gordy Bergamo, MD  AHF: Dr. Gardenia  Chief Complaint: a/c CHF + Afib w/ RVR   Patient Profile   78 y/o male w/ chronic systolic heart failure EF 20-25%, mod-severe MR and CAD s/p recent PCI to LAD c/b coronary perforation and tamponade requiring urgent pericardiocentesis, now readmitted for a/c CHF w/ low output in the setting of new Afib/AFL w/ RVR.    Subjective:    7/9 Transferred to the ICU for Cardiogenic shock. Swan placed at bedside.  Continues on Milrinone  0.375 mcg/kg/min. Remains in AFL, 110s. Amio gtt at 60/hr   Co-ox 74%, CVP 3  LFTs continue to trend down   Has met with Palliative Care and now limited code (DNR/DNI). He refused all evening meds yesterday. He does not want DCCV today. He is clear he does not want any further procedures and wants go comfort in the next 24-48 hrs, after he has had visits from family members.   Wife and daughter at bedside. He is currently resting comfortably. No respiratory difficulty.    Objective:   Weight Range: 90.1 kg Body mass index is 27.7 kg/m.   Vital Signs:   Temp:  [97.3 F (36.3 C)-98.4 F (36.9 C)] 97.5 F (36.4 C) (07/14 0715) Pulse Rate:  [71-148] 111 (07/14 0715) Resp:  [12-25] 20 (07/14 0715) BP: (97-139)/(66-113) 111/88 (07/14 0715) SpO2:  [84 %-97 %] 94 % (07/14 0715) FiO2 (%):  [21 %] 21 % (07/13 2317) Weight:  [90.1 kg] 90.1 kg (07/14 0427) Last BM Date :  (PTA)  Weight change: Filed Weights   07/16/23 0500 07/18/23 0500 07/19/23 0427  Weight: 88 kg 87 kg 90.1 kg    Intake/Output:   Intake/Output Summary (Last 24 hours) at 07/19/2023 0741 Last data filed at 07/19/2023 0700 Gross per 24 hour  Intake 1407.78 ml  Output 935 ml  Net 472.78 ml      Physical Exam   General:  weak/fatigued appearing. No respiratory difficulty HEENT: normal Neck: supple.  + RIJ swan, JVD not elevated  Cor: PMI nondisplaced. Irregularly irregular rhythm and  rate. 2/6 MR murmur  Lungs: decreased BS at the bases bilaterally  Abdomen: soft, nontender, nondistended. No hepatosplenomegaly. No bruits or masses. Good bowel sounds. Extremities: no cyanosis, clubbing, rash, edema Neuro: alert & oriented x 3, cranial nerves grossly intact. moves all 4 extremities w/o difficulty. Affect pleasant.  Telemetry   Atrial flutter 110s-120s Personally reviewed  Labs    CBC Recent Labs    07/18/23 0441 07/19/23 0417  WBC 10.4 10.1  HGB 15.0 14.6  HCT 43.7 42.7  MCV 100.9* 101.7*  PLT 191 161   Basic Metabolic Panel Recent Labs    92/86/74 0441 07/19/23 0417  NA 129* 129*  K 3.3* 3.1*  CL 84* 88*  CO2 32 30  GLUCOSE 139* 121*  BUN 23 18  CREATININE 1.12 0.87  CALCIUM  9.2 8.8*  MG 2.1 1.9   Liver Function Tests Recent Labs    07/18/23 0441 07/19/23 0417  AST 165* 80*  ALT 638* 377*  ALKPHOS 108 92  BILITOT 1.4* 1.5*  PROT 6.9 6.1*  ALBUMIN 3.6 3.2*   No results for input(s): LIPASE, AMYLASE in the last 72 hours.  Cardiac Enzymes No results for input(s): CKTOTAL, CKMB, CKMBINDEX, TROPONINI in the last 72 hours.  BNP: BNP (last 3 results) Recent Labs    06/14/23 1200 06/29/23 1703 07/13/23 1130  BNP 657.2* 575.2* 1,339.7*  ProBNP (last 3 results) Recent Labs    11/13/22 1539  PROBNP 400     D-Dimer No results for input(s): DDIMER in the last 72 hours. Hemoglobin A1C No results for input(s): HGBA1C in the last 72 hours. Fasting Lipid Panel No results for input(s): CHOL, HDL, LDLCALC, TRIG, CHOLHDL, LDLDIRECT in the last 72 hours. Thyroid  Function Tests No results for input(s): TSH, T4TOTAL, T3FREE, THYROIDAB in the last 72 hours.  Invalid input(s): FREET3  Other results:   Imaging    No results found.     Medications:     Scheduled Medications:  apixaban   5 mg Oral BID   aspirin  EC  81 mg Oral Daily   budesonide   0.5 mg Nebulization BID   buPROPion   150  mg Oral q morning   Chlorhexidine  Gluconate Cloth  6 each Topical Daily   clopidogrel   75 mg Oral Daily   digoxin   0.125 mg Oral Daily   dronabinol   2.5 mg Oral QAC lunch   ezetimibe   10 mg Oral Daily   mexiletine  150 mg Oral Q8H   pantoprazole   40 mg Oral QAC breakfast   Pirfenidone   801 mg Oral TID WC   polyethylene glycol  17 g Oral BID   senna-docusate  2 tablet Oral QHS   sodium chloride  flush  10-40 mL Intracatheter Q12H   sodium chloride  flush  3 mL Intravenous Q12H    Infusions:  amiodarone  60 mg/hr (07/19/23 0700)   milrinone  0.375 mcg/kg/min (07/19/23 0700)   potassium chloride  50 mL/hr at 07/19/23 0700    PRN Medications: acetaminophen  **OR** acetaminophen , albuterol , ondansetron  (ZOFRAN ) IV, mouth rinse, polyethylene glycol, sodium chloride  flush    Assessment/Plan   1. Acute on chronic systolic CHF w/ Low Output:  - Ischemic cardiomyopathy.   - Echo in 6/25 with EF 25-30%, mild RV dysfunction, moderate MR.   - Readmitted with a/c CHF with low-output in setting of Afib with RVR. Lactic acid peaked at 3.1, now cleared. - Echo w/ EF now < 20%, RV mildly reduced, moderate MR - Remains on milrinone  0.375, Co-ox 74%   - CVP 2-3 - off diuretics - Suspect End-stage. EF is 25-30% at baseline and likely further reduced by AF. Wife reports his functional capacity and QoL has been poor for almost a year. After meeting w/ palliative care team for GOC discussion, pt is clear that he does not want any further procedures or aggressive interventions and wants to transition to comfort care, after family has chance to visit   2. Atrial fibrillation: New diagnosis. - Converted to SR 07/09. Back in AF -> AFL - no plans for DCCV attempt, pt going comfort  - Continue amio 60/hr until he is ready to discontinue gtts  - Continue heparin  gtt   3. CAD: Has CTO of RCA.  In 6/25, had complicated PCI to severe proximal LAD stenosis with orbital atherectomy and DES.  This was complicated by  coronary perforation and tamponade.  - Continue DAPT  4. H/o pericardial tamponade: Due to coronary perforation in 6/25 associated with LAD PCI. S/p pericardiocentesis.   5. Mitral regurgitation: Moderate-severe by TEE in 5/25 and being considered for mTEER, but noted as no more than moderate on recent echoes.   - Echo this admission w/ moderate MR  6. 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  - Improving with inotrope support  7. 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  - mexiletine discontinued on admit now that he is on amiodarone .  Added back on 07/09 d/t frequent PVCs. Burden improved.   8. Idiopathic pulmonary fibrosis: Continue pirfenidone . - currently on IV amio for rate/rhythm control, long term use not ideal   9. Physical debility, severe - he reports his functional capacity and QoL has been poor for almost a year  10. AKI - due to ATN/shock cardiorenal - Scr peaked 1.5 now back to normal with inotrope support  11. DNR/DNI - plan to transition to comfort care, when patient is ready after family visits  - allow regular diet      Length of Stay: [address] Marcine RIGGERS  07/19/2023, 7:41 AM  Advanced Heart Failure Team Pager [phone] (M-F; 7a - 5p)  Please contact [facility] for night-coverage after hours (5p -7a ) and weekends on [email or web address]

## 2023-07-19 NOTE — Progress Notes (Signed)
 Occupational Therapy Discharge Patient Details Name: Jose O Fontan Jr. MRN: [id number] DOB: 09/26/1945 Today's Date: 07/19/2023 Time:  -     Patient discharged from OT services secondary to medical decline - will need to re-order OT to resume therapy services.  Please see latest therapy progress note for current level of functioning and progress toward goals.    Progress and discharge plan discussed with patient and/or caregiver: Patient/Caregiver agrees with plan  GO     Jose Hines 07/19/2023, 12:39 PM

## 2023-07-19 NOTE — Progress Notes (Signed)
   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 Palliative care is involved to support additional goals of care conversations.   Today's Discussion 07/19/2023  *Please note that this is a verbal dictation therefore any spelling or grammatical errors are due to the Dragon Medical One system interpretation.  Chart reviewed inclusive of vital signs, progress notes, laboratory results, and diagnostic images.   A family meeting was held this morning with Fisher, his wife, Arland, and his daughter. Patients RN, Laurin and I were present.   We discussed that patient is ready. We reviewed his understanding of his advanced heart failure and his readiness to pass away. He shares he has lived an amazing life.   We discussed what comfort care would look like. We talked about transition to comfort measures in house and what that would entail inclusive of medications to control pain, dyspnea, agitation, nausea, itching, and hiccups.  We discussed stopping all uneccessary measures such as cardiac monitoring, blood draws, needle sticks, and frequent vital signs. Created space and opportunity for patient to explore thoughts feelings and fears regarding current medical situation.  Blaise would like canned tomatoes, salt, saltines, and a PBR.   Plan will be for Kiandre to visit with friends and family today and tomorrow to transition to comfort measures.   Questions and concerns addressed/Palliative Support Provided.   Objective Assessment: Vital Signs Vitals:   07/19/23 1030 07/19/23 1100  BP: 111/84 99/84  Pulse: (!) 118 (!) 104  Resp: 18 (!) 22  Temp: 98.8 F (37.1 C) 98.8 F (37.1 C)  SpO2:  91%    Intake/Output Summary (Last 24 hours) at  07/19/2023 1201 Last data filed at 07/19/2023 0700 Gross per 24 hour  Intake 1069.54 ml  Output 685 ml  Net 384.54 ml   Last Weight  Most recent update: 07/19/2023  4:27 AM    Weight  90.1 kg (198 lb 10.2 oz)            Gen:  Elderly Caucasian M chronically ill appearing HEENT: moist mucous membranes CV: Irregular rate and rhythm  PULM:  On RA, breathing is nonlabored ABD: soft/nontender  EXT: No edema  Neuro: Awake and alert  SUMMARY OF RECOMMENDATIONS   DNAR/DNI   Family meeting held  Plan for friends and family to visit today  Will transition to comfort care tomorrow   Ongoing PMT support ______________________________________________________________________________________ Rosaline Becton [facility] Palliative Medicine Team Team Cell Phone: [phone] Please utilize secure chat with additional questions, if there is no response within 30 minutes please call the above phone number  Time Spent: 65 Billing based on MDM: High  Palliative Medicine Team providers are available by phone from 7am to 7pm daily and can be reached through the team cell phone.  Should this patient require assistance outside of these hours, please call the patient's attending physician.

## 2023-07-20 ENCOUNTER — Encounter (HOSPITAL_COMMUNITY): Admitting: Internal Medicine

## 2023-07-20 DIAGNOSIS — Z7189 Other specified counseling: Secondary | ICD-10-CM | POA: Diagnosis not present

## 2023-07-20 DIAGNOSIS — Z515 Encounter for palliative care: Secondary | ICD-10-CM | POA: Diagnosis not present

## 2023-07-20 DIAGNOSIS — I5023 Acute on chronic systolic (congestive) heart failure: Secondary | ICD-10-CM | POA: Diagnosis not present

## 2023-07-20 DIAGNOSIS — Z0279 Encounter for issue of other medical certificate: Secondary | ICD-10-CM

## 2023-07-20 LAB — COMPREHENSIVE METABOLIC PANEL WITH GFR
ALT: 255 U/L — ABNORMAL HIGH (ref 0–44)
AST: 63 U/L — ABNORMAL HIGH (ref 15–41)
Albumin: 3 g/dL — ABNORMAL LOW (ref 3.5–5.0)
Alkaline Phosphatase: 88 U/L (ref 38–126)
Anion gap: 9 (ref 5–15)
BUN: 18 mg/dL (ref 8–23)
CO2: 32 mmol/L (ref 22–32)
Calcium: 8.8 mg/dL — ABNORMAL LOW (ref 8.9–10.3)
Chloride: 89 mmol/L — ABNORMAL LOW (ref 98–111)
Creatinine, Ser: 0.88 mg/dL (ref 0.61–1.24)
GFR, Estimated: >60 mL/min (ref >60)
Glucose, Bld: 121 mg/dL — ABNORMAL HIGH (ref 70–99)
Potassium: 3.3 mmol/L — ABNORMAL LOW (ref 3.5–5.1)
Sodium: 130 mmol/L — ABNORMAL LOW (ref 135–145)
Total Bilirubin: 1.1 mg/dL (ref 0.0–1.2)
Total Protein: 5.9 g/dL — ABNORMAL LOW (ref 6.5–8.1)

## 2023-07-20 LAB — MAGNESIUM: Magnesium: 2 mg/dL (ref 1.7–2.4)

## 2023-07-20 MED ORDER — BIOTENE DRY MOUTH MT LIQD: 15.0000 mL | OROMUCOSAL | Status: DC | PRN

## 2023-07-20 MED ORDER — POLYVINYL ALCOHOL 1.4 % OP SOLN
1.0000 [drp] | Freq: Four times a day (QID) | OPHTHALMIC | Status: DC | PRN
Start: 2023-07-20 — End: 2023-07-21
  Administered 2023-07-20: 1 [drp] via OPHTHALMIC
  Filled 2023-07-20 (×2): qty 15

## 2023-07-20 MED ORDER — LORAZEPAM 2 MG/ML IJ SOLN
2.0000 mg | INTRAMUSCULAR | Status: DC | PRN
  Administered 2023-07-20 (×3): 2 mg via INTRAVENOUS
  Filled 2023-07-20 (×4): qty 1

## 2023-07-20 MED ORDER — GLYCOPYRROLATE 0.2 MG/ML IJ SOLN: 0.2000 mg | INTRAMUSCULAR | Status: DC | PRN

## 2023-07-20 MED ORDER — GLYCOPYRROLATE 0.2 MG/ML IJ SOLN
0.2000 mg | INTRAMUSCULAR | Status: DC | PRN
  Filled 2023-07-20: qty 1

## 2023-07-20 MED ORDER — GLYCOPYRROLATE 1 MG PO TABS
1.0000 mg | ORAL_TABLET | ORAL | Status: DC | PRN
  Filled 2023-07-20: qty 1

## 2023-07-20 MED ORDER — LORAZEPAM 2 MG/ML IJ SOLN
2.0000 mg | Freq: Four times a day (QID) | INTRAMUSCULAR | Status: DC
  Administered 2023-07-20 (×2): 2 mg via INTRAVENOUS
  Filled 2023-07-20 (×2): qty 1

## 2023-07-20 MED ORDER — MORPHINE BOLUS VIA INFUSION
4.0000 mg | INTRAVENOUS | Status: DC | PRN
  Administered 2023-07-20 (×4): 4 mg via INTRAVENOUS

## 2023-07-20 MED ORDER — MORPHINE 100MG IN NS 100ML (1MG/ML) PREMIX INFUSION
8.0000 mg/h | INTRAVENOUS | Status: DC
  Administered 2023-07-20: 4 mg/h via INTRAVENOUS
  Administered 2023-07-20: 14 mg/h via INTRAVENOUS
  Filled 2023-07-20 (×2): qty 100

## 2023-07-23 ENCOUNTER — Other Ambulatory Visit: Payer: Self-pay

## 2023-07-23 ENCOUNTER — Other Ambulatory Visit: Payer: Self-pay | Admitting: Pharmacy Technician

## 2023-07-23 NOTE — Progress Notes (Signed)
 Disenrolled; Patient Deceased

## 2023-08-01 NOTE — Progress Notes (Signed)
  AHF team ICU Note  Patient transferred to ICU by Dr. Mclean over concerns for low output HF/shock.   Patient appears cool on exam and fatigued despite empiric milrinone .   I d/w him and family at length. He has had progressive fatigue and HF symptoms for at least 6 months. Very limited.   Says he is willing to try aggressive therapy - particularly as he may gain some EF improvement from return of NSR (AF is new)  I placed Swan at bedside.   CVP ~20 CI 1.4-1.5 range on milrinone   Milrinone  increased. Can add NE as needed. IV diuresis   Continue IV amio for AF.  Prognosis guarded  CRITICAL CARE Performed by: Cherrie Sieving  Total critical care time: 55 minutes  Critical care time was exclusive of separately billable procedures and treating other patients.  Critical care was necessary to treat or prevent imminent or life-threatening deterioration.  Critical care was time spent personally by me (independent of midlevel providers or residents) on the following activities: development of treatment plan with patient and/or surrogate as well as nursing, discussions with consultants, evaluation of patient's response to treatment, examination of patient, obtaining history from patient or surrogate, ordering and performing treatments and interventions, ordering and review of laboratory studies, ordering and review of radiographic studies, pulse oximetry and re-evaluation of patient's condition.   Sieving Cherrie, MD  9:57 PM

## 2023-08-05 ENCOUNTER — Encounter

## 2023-08-05 ENCOUNTER — Ambulatory Visit: Admitting: Internal Medicine

## 2023-08-06 NOTE — Progress Notes (Addendum)
   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 Palliative care is involved to support additional goals of care conversations.   Today's Discussion Aug 10, 2023  *Please note that this is a verbal dictation therefore any spelling or grammatical errors are due to the Dragon Medical One system interpretation.  Chart reviewed inclusive of vital signs, progress notes, laboratory results, and diagnostic images.   I met with Jose Hines and his wife, Jose Hines this morning. We discussed the plan this morning which will be to stop present measures and start comfort focused care. We reviewed the medications which will be provided will likely make Jose Hines more sleepy and less interactive which he is okay with.   We discussed the time frame from not until his death is not clear but our role will be to support him throughout this.  We reviewed the process after death occurs with patients wife.  Questions and concerns addressed/Palliative Support Provided.   Communication with wife and primary team regarding plan moving forward.  ___________________ Addendum:  Stopped by this late afternoon. Patients spouse and daughter concerned about active symptoms. We discussed increasing the gtt rate or morphine .  Change made at bedside and modified in chart.   Add Time: 15  Objective Assessment: Vital Signs Vitals:   2023-08-10 0746 August 10, 2023 0835  BP:    Pulse:    Resp:    Temp: 97.7 F (36.5 C)   SpO2:  95%    Intake/Output Summary (Last 24 hours) at 10-Aug-2023 1026 Last data filed at 2023/08/10 9087 Gross per 24 hour  Intake 964.18 ml  Output 975 ml  Net -10.82 ml   Last Weight  Most recent update: August 10, 2023  6:15 AM    Weight  91.7 kg (202 lb 2.6 oz)             Gen:  Elderly Caucasian M chronically ill appearing HEENT: moist mucous membranes CV: Irregular rate and rhythm  PULM:  On RA, breathing is nonlabored ABD: soft/nontender  EXT: No edema  Neuro: Awake and alert  SUMMARY OF RECOMMENDATIONS   DNAR/DNI   Comfort Care  Morphine  gtt with titration and bolus orders as needed  Ativan  ATC  Additional comfort medications per [facility]   Ongoing PMT support ______________________________________________________________________________________ Rosaline Becton [place] Palliative Medicine Team Team Cell Phone: [phone] Please utilize secure chat with additional questions, if there is no response within 30 minutes please call the above phone number  Billing based on MDM: High  Palliative Medicine Team providers are available by phone from 7am to 7pm daily and can be reached through the team cell phone.  Should this patient require assistance outside of these hours, please call the patient's attending physician.

## 2023-08-06 NOTE — Progress Notes (Signed)
 Patient PTA medication pirfenidone  was being dispensed from main pharmacy from this patient's home supply. The patient is now deceased. Family has deferred taking the remainder of home supply with them. RN notified pharmacy and medication was gathered from floor and taken back to main pharmacy for destruction with the remainder of the home supply that was being stored there.   Sharyne Glatter, PharmD, BCCCP Clinical Pharmacist 07-30-2023 9:38 PM

## 2023-08-06 NOTE — Plan of Care (Signed)
   Problem: Coping: Goal: Level of anxiety will decrease Outcome: Progressing   Problem: Pain Managment: Goal: General experience of comfort will improve and/or be controlled Outcome: Progressing

## 2023-08-06 NOTE — TOC Transition Note (Signed)
 Transition of Care [facility]) - Discharge Note   Patient Details  Name: Jose Hines. MRN: [id number] Date of Birth: Sep 26, 1945  Transition of Care [facility]) CM/SW Contact:  Justina Delcia Czar, RN Phone Number: [phone] 08-Aug-2023, 11:55 AM   Clinical Narrative:    Wife had concerns about next steps after patient was transitioned to comfort care. She wanted to make sure she honored her husband wishes.  Spoke to wife, CM was able to answer question regarding cremation. Educated after a person passes away, there are important steps that need to be taken to ensure proper care of the body and to honor their wishes. Throughout this process, licensed professionals handle the body with care and respect, ensuring legal and health standards are met. If you have any specific concerns or preferences, it's best to discuss them ahead of time with the funeral or cremation service provider, who can guide you through the options available.  She had several companies that she wanted to discuss process. Explained it is best to contact as soon as possible so they can be prepared to honor patient and her wishes without delay.   Provided CM contact number is she had any additional questions. CM available to assist family during difficult time.     Barriers to Discharge: No Barriers Identified   Patient Goals and CMS Choice     Discharge Placement     Discharge Plan and Services Additional resources added to the After Visit Summary for     Discharge Planning Services: CM Consult Post Acute Care Choice: Home Health              Social Drivers of Health (SDOH) Interventions SDOH Screenings   Food Insecurity: No Food Insecurity (07/15/2023)  Housing: Low Risk  (07/15/2023)  Transportation Needs: No Transportation Needs (07/15/2023)  Utilities: Not At Risk (07/15/2023)  Social Connections: Moderately Isolated (07/15/2023)  Tobacco Use: Medium Risk (07/13/2023)     Readmission Risk Interventions      No data to display

## 2023-08-06 NOTE — Progress Notes (Signed)
 Advanced Heart Failure Rounding Note  Cardiologist: Gordy Bergamo, MD  AHF: Dr. Gardenia  Chief Complaint: a/c CHF + Afib w/ RVR   Patient Profile   78 y/o male w/ chronic systolic heart failure EF 20-25%, mod-severe MR and CAD s/p recent PCI to LAD c/b coronary perforation and tamponade requiring urgent pericardiocentesis, now readmitted for a/c CHF w/ low output in the setting of new Afib/AFL w/ RVR.    Subjective:    7/9 Transferred to the ICU for Cardiogenic shock. Swan placed at bedside. 7/14 Pt decided to move towards comfort measures   Back in NSR this morning w/ amio gtt.   Pt ready to move towards comfort care and stop inotropes today. He appears comfortable currently. Says he had a good breakfast. Family present at bedside.     Objective:   Weight Range: 91.7 kg Body mass index is 28.2 kg/m.   Vital Signs:   Temp:  [97.6 F (36.4 C)-99 F (37.2 C)] 97.7 F (36.5 C) 2023/08/08 0746) Pulse Rate:  [77-129] 83 08-08-23 0715) Resp:  [14-27] 21 Aug 08, 2023 0715) BP: (99-118)/(71-90) 112/73 08-Aug-2023 0700) SpO2:  [87 %-95 %] 95 % 08-08-2023 0835) FiO2 (%):  [21 %] 21 % (07/14 2136) Weight:  [91.7 kg] 91.7 kg 2023-08-08 0500) Last BM Date :  (PTA)  Weight change: Filed Weights   07/18/23 0500 07/19/23 0427 08/08/23 0500  Weight: 87 kg 90.1 kg 91.7 kg    Intake/Output:   Intake/Output Summary (Last 24 hours) at 08/08/2023 1020 Last data filed at 08-08-23 0912 Gross per 24 hour  Intake 964.18 ml  Output 975 ml  Net -10.82 ml      Physical Exam   General:  fatigued appearing no respiratory difficulty  HEENT: normal Neck: JVD 7 cm  Cor: RRR  Lungs: clear Abdomen: soft, nontender, nondistended.  Extremities: no cyanosis, clubbing, rash, trace b/l L edema Neuro: alert & oriented x 3, cranial nerves grossly intact. moves all 4 extremities w/o difficulty. Affect pleasant.  Telemetry   NSR 72 bpm Personally reviewed  Labs    CBC Recent Labs    07/18/23 0441  07/19/23 0417  WBC 10.4 10.1  HGB 15.0 14.6  HCT 43.7 42.7  MCV 100.9* 101.7*  PLT 191 161   Basic Metabolic Panel Recent Labs    92/85/74 0417 08-08-23 0429  NA 129* 130*  K 3.1* 3.3*  CL 88* 89*  CO2 30 32  GLUCOSE 121* 121*  BUN 18 18  CREATININE 0.87 0.88  CALCIUM  8.8* 8.8*  MG 1.9 2.0   Liver Function Tests Recent Labs    07/19/23 0417 08/08/2023 0429  AST 80* 63*  ALT 377* 255*  ALKPHOS 92 88  BILITOT 1.5* 1.1  PROT 6.1* 5.9*  ALBUMIN 3.2* 3.0*   No results for input(s): LIPASE, AMYLASE in the last 72 hours.  Cardiac Enzymes No results for input(s): CKTOTAL, CKMB, CKMBINDEX, TROPONINI in the last 72 hours.  BNP: BNP (last 3 results) Recent Labs    06/14/23 1200 06/29/23 1703 07/13/23 1130  BNP 657.2* 575.2* 1,339.7*    ProBNP (last 3 results) Recent Labs    11/13/22 1539  PROBNP 400     D-Dimer No results for input(s): DDIMER in the last 72 hours. Hemoglobin A1C No results for input(s): HGBA1C in the last 72 hours. Fasting Lipid Panel No results for input(s): CHOL, HDL, LDLCALC, TRIG, CHOLHDL, LDLDIRECT in the last 72 hours. Thyroid  Function Tests No results for input(s): TSH, T4TOTAL, T3FREE,  THYROIDAB in the last 72 hours.  Invalid input(s): FREET3  Other results:   Imaging    No results found.     Medications:     Scheduled Medications:  budesonide   0.5 mg Nebulization BID   LORazepam   2 mg Intravenous Q6H    Infusions:  morphine  4 mg/hr (08/16/23 1013)    PRN Medications: acetaminophen  **OR** acetaminophen , antiseptic oral rinse, artificial tears, glycopyrrolate  **OR** glycopyrrolate  **OR** glycopyrrolate , LORazepam , morphine  injection, morphine , ondansetron  (ZOFRAN ) IV    Assessment/Plan   1. Acute on chronic systolic CHF w/ Low Output:  - Ischemic cardiomyopathy.   - Echo in 6/25 with EF 25-30%, mild RV dysfunction, moderate MR.   - Readmitted with a/c CHF with  low-output in setting of Afib with RVR. Lactic acid peaked at 3.1, now cleared. - Echo w/ EF now < 20%, RV mildly reduced, moderate MR - Suspect End-stage. EF is 25-30% at baseline and likely further reduced by AF. Wife reports his functional capacity and QoL has been poor for almost a year. After meeting w/ palliative care team for GOC discussion, pt is clear that he does not want any further procedures or aggressive interventions and wants to transition to comfort care. He is ready to stop inotropes today and start comfort gtts when needed.   2. Atrial fibrillation: New diagnosis. - back in NSR today, converted w/ amio gtt  3. CAD: Has CTO of RCA.  In 6/25, had complicated PCI to severe proximal LAD stenosis with orbital atherectomy and DES.  This was complicated by coronary perforation and tamponade.  - transitioning to comfort today   4. H/o pericardial tamponade: Due to coronary perforation in 6/25 associated with LAD PCI. S/p pericardiocentesis.   5. Mitral regurgitation: Moderate-severe by TEE in 5/25 and being considered for mTEER, but noted as no more than moderate on recent echoes. Echo this admission w/ moderate MR  6. DNR/DNI - plan to transition to comfort care. PC team following, starting comfort meds. Appreciate palliative care team - allow regular diet      Length of Stay: [address], PA-C  08-16-23, 10:20 AM  Advanced Heart Failure Team Pager [phone] (M-F; 7a - 5p)  Please contact [facility] for night-coverage after hours (5p -7a ) and weekends on [email or web address]

## 2023-08-06 NOTE — CV Procedure (Addendum)
   Pulmonary Artery Catheter Insertion Procedure Note Jose Hines [id number] 1945-11-01  Catch-up procedure note.   Procedure date: 07/14/23 Operator: Toribio Fuel, MD  Procedure: Insertion of Pulmonary Artery Catheter Indications: Shock - Hemodynamic Monitoring   Procedure Details Consent: Risks of procedure as well as the alternatives and risks of each were explained to the (patient/caregiver).  Consent for procedure obtained. Time Out: Verified patient identification, verified procedure, site/side was marked, verified correct patient position, special equipment/implants available, medications/allergies/relevent history reviewed, required imaging and test results available.  Performed   The right neck was prepped and draped in the routine sterile fashion and anesthetized with 1% local lidocaine . An 8 FR venous sheath was placed in the right internal jugular vein using a modified Seldinger technique and u/s guidance. A standard Swan-Ganz catheter was used for the procedure. The distal tip of the PA cath was maneuvered into the right pulmonary artery using pressure waveform guidance and the sheath was sutured in place.     Evaluation Blood flow good Complications: No apparent complications Patient did tolerate procedure well. Chest X-ray ordered to verify placement.  CXR: Good placement. No PTX. Personally reviewed    Toribio Fuel, MD  9:58 PM

## 2023-08-06 NOTE — Progress Notes (Signed)
 I was contacted by the patients nurse because his potassium is 3.3 and he is no longer taking oral medications. I did not order IV potassium because he is switching to palliative care/comfort care and did not want to make the patient uncomfortable with IV potassium. It also looks like he has had hypokalemia for 4 days at this point. The heart failure team rounding on the patient is aware the patient is not taking oral medications and can discuss with patient to decide the best path to go forward.   Aaradhya Kysar PA-C

## 2023-08-06 NOTE — Death Summary Note (Cosign Needed Addendum)
  Advanced Heart Failure Death Summary  Death Summary   Patient ID: Jose Hines. MRN: [id number], DOB/AGE: 06-22-1945 78 y.o. Admit date: 07/13/2023 D/C date:    08-06-23   Primary Discharge Diagnoses:  Stage D, Acute on Chronic Systolic Heart Failure w/ Cardiogenic Shock  Atrial Fibrillation w/ RVR Mod-Severe MR  AKI  Shock Liver  CAD   Hospital Course:   78 y/o male w/ chronic systolic heart failure, mod-severe MR and CAD. Had recent admit 6/25 for elective PCI to LAD c/b coronary perforation and tamponade requiring urgent pericardiocentesis.  He was discharged on 07/04/23.   He was seen back in [facility] for post hospital f/u on 7/8 and was feeling poorly w/ low output HF symptoms. Also found to be in new atrial fibrillation w/ RVR in the 140s. He was sent to the ED for readmission. ED w/u c/w cardiogenic shock w/ LA of 3.1, Co-ox 39% + AKI and shock liver. Repeat echo showed no reaccumlation of pericardial fluid. EF had further dropped to <20% (previously 25-30%), RV mildly reduced. Moderate MR.   He was admitted to the CCU, started on milrinone , amio and heparin  gtts. Diuresed w/ IV Lasix . Swan placed to help guide therapies. Ultimately required up titration of milrinone  to 0.375 mcg/k/min but remained tenuous. He was felt to be end-stage HF. Patient and his wife reported that his functional capacity and QoL had been poor for almost a year. Palliative care team was consulted for GOC discussion. He had been scheduled for TEE/DCCV in an attempt to restore NSR, however after GOC discussions, pt voiced that he did not want any further procedures or aggressive interventions and wanted to transition to comfort care. After visitation w/ family and friends, pt was transitioned to comfort care on 7/16. Was made DRN/DNI. Milrinone  and amio gtts discontinued. Palliative care initiated comfort gtts. Pt peacefully passed 07/21/23 at 2047.     Significant Diagnostic Studies Echo 07/14/23 1. Limited study  to assess MR; full doppler not performed.   2. Left ventricular ejection fraction, by estimation, is <20%. The left  ventricle has severely decreased function. The left ventricle demonstrates  global hypokinesis. The left ventricular internal cavity size was severely  dilated. Left ventricular  diastolic parameters are indeterminate.   3. Right ventricular systolic function is mildly reduced. The right  ventricular size is normal.   4. Left atrial size was severely dilated.   5. The mitral valve is normal in structure. Moderate mitral valve  regurgitation. No evidence of mitral stenosis.   6. Tricuspid valve regurgitation is moderate.   7. The aortic valve has an indeterminant number of cusps.   8. The inferior vena cava is dilated in size with <50% respiratory  variability, suggesting right atrial pressure of 15 mmHg.    Consultations  Advanced Heart Failure  Palliative Care   Duration of Discharge Encounter: Greater than 35 minutes   Signed, Caffie Shed, PA-C 07/21/2023, 7:14 AM

## 2023-08-06 DEATH — deceased

## 2023-08-11 ENCOUNTER — Telehealth: Payer: Self-pay | Admitting: Cardiology

## 2023-08-11 NOTE — Telephone Encounter (Signed)
 Per wife a claim for is being sent today to Dr Ladona and she wanted us  to be on the look out for it. She has asked for us  to call her back if we do not get it.

## 2023-08-13 ENCOUNTER — Telehealth: Payer: Self-pay | Admitting: Cardiology

## 2023-08-13 NOTE — Telephone Encounter (Signed)
 Paper Work Dropped Off: Paperwork in envelope with instructions from wife.  Date: 08-13-23  Location of paper:  Dr. Godfrey mailbox

## 2023-08-16 ENCOUNTER — Telehealth: Payer: Self-pay | Admitting: Cardiology

## 2023-08-16 NOTE — Telephone Encounter (Signed)
 Patient's spouse, Arland, came in and paid the $29 form fee and signed the release of information.  Upon seeing the patient's Will and confirming that Arland is the Executor, I scanned the Will into the patient's chart.  Ganji has the form.

## 2023-08-16 NOTE — Telephone Encounter (Signed)
 Received [employer] Critical [employer]ness form for spouse, Jose Hines's, employer.  Jose Hines will be coming in with the Executor documents and will pay and sign for the form to be completed.  Form taken to [facility].

## 2023-08-17 ENCOUNTER — Ambulatory Visit: Admitting: Cardiology

## 2023-08-19 NOTE — Telephone Encounter (Signed)
 Completed form given to L. Smitty.

## 2023-08-19 NOTE — Telephone Encounter (Signed)
 Completed form faxed to [employer] and scanned to chart.  Billing notified.

## 2023-08-23 NOTE — Telephone Encounter (Signed)
 Wife is following up. She says because in documents Dr. Ladona declared the stent a minor procedure, not a major procedure, insurance didn't approve of claim. Wife would like to know if something can be written to have this corrected or appealed. Please advise.

## 2023-08-23 NOTE — Telephone Encounter (Signed)
 Patient's wife notified.  She is aware Arvin will call her tomorrow regarding picking up or faxing paperwork.

## 2023-08-23 NOTE — Telephone Encounter (Signed)
 Please inform the patient that I did not mention that as a simple procedure, I would like to write a letter to the insurance company that it was a catastrophic event that followed during hospitalization that eventually led to his demise.  Coronary stent can be simple but can also be complex but he also had complication from the procedure as well.  So the insurance company is misrepresenting the latter.  I will reprint the form that was filled and tried to clarify this.   I have made it clear with a written note regarding this as well. She can pick up the papers anytime tomorrow

## 2023-08-24 NOTE — Telephone Encounter (Signed)
 Pt spouse called in stating insurance summary to include that blockage was major, he needed a double bypass but he couldn't have that so they did a stent to keep him alive. Please advise.

## 2023-08-24 NOTE — Telephone Encounter (Signed)
 Did she pick up the papers, if now I would like to add it to the papers I wrote yesterday, otherwise I would like her to bring the papers back to me that I gave her yesterday. I am sorry for this really

## 2023-08-25 NOTE — Telephone Encounter (Signed)
 Addendum to previous Critical Illness form faxed to [employer] and scanned into patient's chart.

## 2023-08-27 ENCOUNTER — Telehealth: Payer: Self-pay | Admitting: Cardiology

## 2023-08-27 NOTE — Telephone Encounter (Signed)
 Paper Work Dropped Off: In front office   Date:08/27/2023  Location of paper: In [place] Box

## 2023-08-27 NOTE — Telephone Encounter (Signed)
 I spoke with patient's wife and let her know Dr Ladona had updated paperwork and it was faxed on 8/20.  She will check with insurance and let us  know if update was not received.

## 2023-09-02 NOTE — Telephone Encounter (Signed)
 I spoke with patient's wife and she has not heard back from insurance company.  She will let us  know if more information needed

## 2023-09-10 NOTE — Telephone Encounter (Signed)
 Pt spouse calling to speak with one of you about paperwork

## 2023-09-14 NOTE — Telephone Encounter (Signed)
 I spoke with patient's wife.  She reports she has spoken with Arvin regarding additional information that needs to be added to form.  Will forward to [place] and LeAnn for follow up.

## 2023-10-01 NOTE — Telephone Encounter (Signed)
 I spoke with patient's wife.  She reports insurance has received updated paperwork but more information is needed.  Dr Ladona requests a new form be sent to him to complete. Patient's wife will try and have new form sent to office.

## 2023-10-20 NOTE — Telephone Encounter (Signed)
 Updated and completed paperwork placed in [employer].

## 2023-10-21 NOTE — Telephone Encounter (Signed)
 Updated critical illness form faxed to [employer] and scanned into chart.
